# Patient Record
Sex: Male | Born: 1985 | Race: White | Hispanic: No | Marital: Married | Smoking: Former smoker
Health system: Southern US, Community
[De-identification: ages and names within clinical notes are randomized; demographics above are authoritative.]

## PROBLEM LIST (undated history)

## (undated) DIAGNOSIS — G709 Myoneural disorder, unspecified: Secondary | ICD-10-CM

## (undated) DIAGNOSIS — R519 Headache, unspecified: Secondary | ICD-10-CM

## (undated) DIAGNOSIS — I10 Essential (primary) hypertension: Secondary | ICD-10-CM

## (undated) DIAGNOSIS — M1990 Unspecified osteoarthritis, unspecified site: Secondary | ICD-10-CM

## (undated) DIAGNOSIS — F909 Attention-deficit hyperactivity disorder, unspecified type: Secondary | ICD-10-CM

## (undated) DIAGNOSIS — F419 Anxiety disorder, unspecified: Secondary | ICD-10-CM

## (undated) DIAGNOSIS — F32A Depression, unspecified: Secondary | ICD-10-CM

## (undated) DIAGNOSIS — J9383 Other pneumothorax: Secondary | ICD-10-CM

## (undated) DIAGNOSIS — R634 Abnormal weight loss: Secondary | ICD-10-CM

## (undated) DIAGNOSIS — R0600 Dyspnea, unspecified: Secondary | ICD-10-CM

## (undated) DIAGNOSIS — D759 Disease of blood and blood-forming organs, unspecified: Secondary | ICD-10-CM

## (undated) HISTORY — DX: Abnormal weight loss: R63.4

## (undated) HISTORY — DX: Other pneumothorax: J93.83

## (undated) HISTORY — PX: OTHER SURGICAL HISTORY: SHX169

---

## 2006-12-10 ENCOUNTER — Ambulatory Visit: Payer: Self-pay | Admitting: Family Medicine

## 2006-12-10 DIAGNOSIS — N63 Unspecified lump in unspecified breast: Secondary | ICD-10-CM | POA: Insufficient documentation

## 2006-12-11 ENCOUNTER — Encounter: Payer: Self-pay | Admitting: Family Medicine

## 2006-12-16 ENCOUNTER — Telehealth: Payer: Self-pay | Admitting: Family Medicine

## 2007-02-12 ENCOUNTER — Ambulatory Visit: Payer: Self-pay | Admitting: Family Medicine

## 2007-02-12 DIAGNOSIS — M546 Pain in thoracic spine: Secondary | ICD-10-CM | POA: Insufficient documentation

## 2007-02-19 ENCOUNTER — Encounter: Admission: RE | Admit: 2007-02-19 | Discharge: 2007-03-06 | Payer: Self-pay | Admitting: Family Medicine

## 2007-02-19 ENCOUNTER — Encounter: Payer: Self-pay | Admitting: Family Medicine

## 2007-03-06 ENCOUNTER — Encounter: Payer: Self-pay | Admitting: Family Medicine

## 2007-06-11 ENCOUNTER — Encounter: Admission: RE | Admit: 2007-06-11 | Discharge: 2007-06-11 | Payer: Self-pay | Admitting: Family Medicine

## 2007-06-11 ENCOUNTER — Ambulatory Visit: Payer: Self-pay | Admitting: Family Medicine

## 2007-06-11 ENCOUNTER — Encounter: Payer: Self-pay | Admitting: Family Medicine

## 2007-06-11 DIAGNOSIS — R079 Chest pain, unspecified: Secondary | ICD-10-CM | POA: Insufficient documentation

## 2007-06-15 LAB — CONVERTED CEMR LAB
ALT: 12 units/L (ref 0–53)
Albumin: 5.4 g/dL — ABNORMAL HIGH (ref 3.5–5.2)
Alkaline Phosphatase: 68 units/L (ref 39–117)
Amylase: 38 units/L (ref 0–105)
CO2: 26 meq/L (ref 19–32)
Calcium: 9.9 mg/dL (ref 8.4–10.5)
Hemoglobin: 13.4 g/dL (ref 13.0–17.0)
MCHC: 32.5 g/dL (ref 30.0–36.0)
MCV: 61.8 fL — ABNORMAL LOW (ref 78.0–100.0)
Monocytes Absolute: 0.7 10*3/uL (ref 0.2–0.7)
Monocytes Relative: 7 % (ref 3–11)
Neutro Abs: 7.5 10*3/uL (ref 1.7–7.7)
Neutrophils Relative %: 70 % (ref 43–77)
Platelets: 260 10*3/uL (ref 150–400)
Total Protein: 8.3 g/dL (ref 6.0–8.3)

## 2007-06-16 ENCOUNTER — Telehealth (INDEPENDENT_AMBULATORY_CARE_PROVIDER_SITE_OTHER): Payer: Self-pay | Admitting: *Deleted

## 2007-06-16 ENCOUNTER — Encounter: Payer: Self-pay | Admitting: Family Medicine

## 2007-06-16 LAB — CONVERTED CEMR LAB
Saturation Ratios: 41 % (ref 20–55)
TIBC: 309 ug/dL (ref 215–435)

## 2007-06-17 ENCOUNTER — Ambulatory Visit: Payer: Self-pay | Admitting: Family Medicine

## 2007-06-17 DIAGNOSIS — R071 Chest pain on breathing: Secondary | ICD-10-CM | POA: Insufficient documentation

## 2007-07-01 ENCOUNTER — Telehealth: Payer: Self-pay | Admitting: Family Medicine

## 2007-07-01 ENCOUNTER — Ambulatory Visit: Payer: Self-pay | Admitting: Family Medicine

## 2007-09-30 ENCOUNTER — Ambulatory Visit: Payer: Self-pay | Admitting: Family Medicine

## 2007-09-30 DIAGNOSIS — D568 Other thalassemias: Secondary | ICD-10-CM | POA: Insufficient documentation

## 2007-09-30 DIAGNOSIS — R634 Abnormal weight loss: Secondary | ICD-10-CM | POA: Insufficient documentation

## 2007-09-30 HISTORY — DX: Abnormal weight loss: R63.4

## 2007-10-01 LAB — CONVERTED CEMR LAB
ALT: 10 units/L (ref 0–53)
AST: 15 units/L (ref 0–37)
Albumin: 5.6 g/dL — ABNORMAL HIGH (ref 3.5–5.2)
BUN: 13 mg/dL (ref 6–23)
CO2: 24 meq/L (ref 19–32)
Chloride: 103 meq/L (ref 96–112)
Glucose, Bld: 93 mg/dL (ref 70–99)
Platelets: 216 10*3/uL (ref 150–400)
RDW: 16.7 % — ABNORMAL HIGH (ref 11.5–15.5)
TSH: 0.719 microintl units/mL (ref 0.350–5.50)
Total Bilirubin: 1.1 mg/dL (ref 0.3–1.2)
WBC: 7 10*3/uL (ref 4.0–10.5)

## 2007-10-29 ENCOUNTER — Telehealth: Payer: Self-pay | Admitting: Family Medicine

## 2007-12-02 ENCOUNTER — Ambulatory Visit: Payer: Self-pay | Admitting: Family Medicine

## 2007-12-02 DIAGNOSIS — F5102 Adjustment insomnia: Secondary | ICD-10-CM | POA: Insufficient documentation

## 2008-06-11 ENCOUNTER — Ambulatory Visit: Payer: Self-pay | Admitting: Family Medicine

## 2008-06-11 DIAGNOSIS — H9209 Otalgia, unspecified ear: Secondary | ICD-10-CM | POA: Insufficient documentation

## 2010-02-26 ENCOUNTER — Encounter: Payer: Self-pay | Admitting: Family Medicine

## 2010-04-07 ENCOUNTER — Encounter: Admission: RE | Admit: 2010-04-07 | Discharge: 2010-04-07 | Payer: Self-pay | Admitting: Family Medicine

## 2010-04-07 ENCOUNTER — Ambulatory Visit: Payer: Self-pay | Admitting: Family Medicine

## 2010-04-07 DIAGNOSIS — J930 Spontaneous tension pneumothorax: Secondary | ICD-10-CM | POA: Insufficient documentation

## 2010-04-07 DIAGNOSIS — Z8709 Personal history of other diseases of the respiratory system: Secondary | ICD-10-CM | POA: Insufficient documentation

## 2010-04-14 ENCOUNTER — Encounter: Payer: Self-pay | Admitting: Family Medicine

## 2010-09-05 NOTE — Assessment & Plan Note (Signed)
Summary: pneumothorax   Vital Signs:  Patient profile:   24 year old male Height:      68.75 inches Weight:      141 pounds BMI:     21.05 O2 Sat:      97 % on Room air Pulse rate:   87 / minute BP sitting:   144 / 90  (left arm) Cuff size:   regular  Vitals Entered By: Raymond [name] CMA (April 07, 2010 9:24 AM)  O2 Flow:  Room air CC: Chest wall pain x 1 day  Pain Assessment Patient in pain? yes     Location: chest Intensity: 6   Primary Care Raymond [name]:  Raymond [name] D.O.  CC:  Chest wall pain x 1 day .  History of Present Illness: 25 yo WF presents for L sided acute chest pain x 1 day.  Has been lifting wts at the gym.  Has some pain with deep inspiration and cough.  Ibuprofen not helping.  Feels sore.  Denies feeling much dyspnea.  Denies f/c or sputum production.  He no longer smokes.  The end of July, he was diagnosed with a spontaneous Pneumothorax at [facility] and transported to [facility] for a wk.  Had a L sided chest tube placed.    Current Medications (verified): 1)  None  Allergies (verified): No Known Drug Allergies  Past History:  Past Medical History: Reviewed history from 06/11/2007 and no changes required. tobacco abuse Hx of thalassemia minor  Past Surgical History: chest tube placed 02-2010 for spontaneous pneumothorax  Social History: Reviewed history from 12/02/2007 and no changes required. IT sales professional for [employer]. high school diploma lives with his fianc Raymond [name] and her two children smokes one pack a day for  4 years;-- quit history of marijuana use. denies alcohol use lifts weights and exercises 3 times a week  Review of Systems      See HPI  Physical Exam  General:  alert, well-developed, well-nourished, and well-hydrated.   Head:  normocephalic and atraumatic.   Neck:  no masses.   Lungs:  Normal respiratory effort, chest expands symmetrically. Lungs are clear to auscultation, no crackles or  wheezes.  splinting. Heart:  Normal rate and regular rhythm. S1 and S2 normal without gallop, murmur, click, rub or other extra sounds. Abdomen:  soft and non-tender.   Msk:  full T spine and UE ROM -- unable to elicit chest wall pain with ROM or palpation Skin:  color normal.  chest tube scar L ant chest wall well healed   Impression & Recommendations:  Problem # 1:  PNEUMOTHORAX (ICD-512.8) 90% on CXR now. Recurrent. Radiology downstairs called 911 and pt was informed of dx. Sent to hospital for immediate chest tube.  Complete Medication List: 1)  Ibuprofen 800 Mg Tabs (Ibuprofen) .Marland Kitchen.. 1 tab by mouth three times a day with food x 10 days  Other Orders: T-DG Chest 2 View [phone])  Patient Instructions: 1)  CXR downstairs today. 2)  Will call you w/ results this afternoon. 3)  Use RX Ibuprofen 800 mg 3 x a day with food for the next wk for pain and inflammation. 4)  Use a heating pad and avoid heavy lifting x 10 days. Prescriptions: IBUPROFEN 800 MG TABS (IBUPROFEN) 1 tab by mouth three times a day with food x 10 days  #30 x 0   Entered and Authorized by:   Raymond Bars DO   Signed by:   Raymond Bars  DO on 04/07/2010   Method used:   Electronically to        [facility] [phone]* (retail)       [address] [place], [place]  [address]       Ph: [phone]       Fax: [phone]   RxID:   [id number]

## 2010-09-05 NOTE — Letter (Signed)
Summary: Meds & Instructions/[facility]  Meds & Instructions/[facility]   Imported By: Lanelle Bal 04/27/2010 10:21:34  _____________________________________________________________________  External Attachment:    Type:   Image     Comment:   External Document

## 2010-09-05 NOTE — Letter (Signed)
Summary: Meds & Instructions/[facility]  Meds & Instructions/[facility]   Imported By: Lanelle Bal 03/06/2010 11:04:18  _____________________________________________________________________  External Attachment:    Type:   Image     Comment:   External Document

## 2010-09-05 NOTE — Letter (Signed)
Summary: Out of Work  [facility]  [address]   [place], [place] [address]   Phone: [phone]  Fax: [phone]    April 07, 2010   Employee:  Raymond [name]    To Whom It May Concern:   For Medical reasons, please excuse the above named employee from work for the following dates:  Start:   Sept 2-3  End:   Sept 4  If you need additional information, please feel free to contact our office.         Sincerely,    Seymour Bars DO

## 2012-07-21 ENCOUNTER — Encounter: Payer: Self-pay | Admitting: Family Medicine

## 2012-07-21 ENCOUNTER — Ambulatory Visit (INDEPENDENT_AMBULATORY_CARE_PROVIDER_SITE_OTHER): Payer: Self-pay | Admitting: Family Medicine

## 2012-07-21 VITALS — BP 136/86 | HR 95 | Ht 70.0 in | Wt 136.0 lb

## 2012-07-21 DIAGNOSIS — F1220 Cannabis dependence, uncomplicated: Secondary | ICD-10-CM

## 2012-07-21 DIAGNOSIS — Z23 Encounter for immunization: Secondary | ICD-10-CM

## 2012-07-21 DIAGNOSIS — F329 Major depressive disorder, single episode, unspecified: Secondary | ICD-10-CM

## 2012-07-21 DIAGNOSIS — F3289 Other specified depressive episodes: Secondary | ICD-10-CM

## 2012-07-21 DIAGNOSIS — F32A Depression, unspecified: Secondary | ICD-10-CM

## 2012-07-21 MED ORDER — PAROXETINE HCL 20 MG PO TABS: ORAL_TABLET | ORAL | Status: DC

## 2012-07-21 NOTE — Progress Notes (Signed)
  Subjective:    Patient ID: Raymond [name], male    DOB: 03-03-1986, 26 y.o.   MRN: [id number]  HPI Says he is depressed.  He is now dependent on pot bc helps his mood. He also smokes cigarettes. It is destroying his relationship and it is taking his money.  Says he thinks he is depressed.  Says mostly withdrawing socially.  Brother died when he is 74.  Not sleeping well for 2 weeks. Wakes up frequently. Doesn't smoke in the middle of the night.  Decreased appetite.  Says but when he smokes his appetitie increases.     Review of Systems     Objective:   Physical Exam  Constitutional: He is oriented to person, place, and time. He appears well-developed and well-nourished.  HENT:  Head: Normocephalic and atraumatic.  Cardiovascular: Normal rate, regular rhythm and normal heart sounds.   Pulmonary/Chest: Effort normal and breath sounds normal.  Neurological: He is alert and oriented to person, place, and time.  Skin: Skin is warm and dry.  Psychiatric: He has a normal mood and affect. His behavior is normal.          Assessment & Plan:  Depression - PHQ 9 score 22.  We discussed different options. I think at this point in time it would be best to consider antidepressant in addition to an injection type program. He says he is very open to this. He was placed on Seroquel when he was in his teens and says he did not do well with this. Thus we will discuss starting him on Paxil. I think this would be a great medication for him because it will hopefully help stimulate his appetite which is one of his issues. He is very thin and underweight based on his height. F/u 1 month.   Addiction - Will call him with additional info regarding some type of addiction course/counseling.   Tdap give today. Flu vaccine declined.    Time spent 25 min, >50% spent in counseling for depression and addiction.

## 2012-07-28 ENCOUNTER — Telehealth: Payer: Self-pay | Admitting: Family Medicine

## 2012-07-28 NOTE — Telephone Encounter (Signed)
Please call pt adn given him the number for [facility]al [facility] in [place].  [phone]. They can help him with his addiction.

## 2012-07-28 NOTE — Telephone Encounter (Signed)
Pt informed

## 2013-01-15 ENCOUNTER — Ambulatory Visit (INDEPENDENT_AMBULATORY_CARE_PROVIDER_SITE_OTHER): Payer: BC Managed Care – PPO | Admitting: Family Medicine

## 2013-01-15 ENCOUNTER — Encounter: Payer: Self-pay | Admitting: Family Medicine

## 2013-01-15 VITALS — BP 119/71 | HR 107 | Wt 131.0 lb

## 2013-01-15 DIAGNOSIS — F411 Generalized anxiety disorder: Secondary | ICD-10-CM

## 2013-01-15 LAB — T4, FREE: Free T4: 1.15 ng/dL (ref 0.80–1.80)

## 2013-01-15 LAB — TSH: TSH: 0.665 u[IU]/mL (ref 0.350–4.500)

## 2013-01-15 MED ORDER — CITALOPRAM HYDROBROMIDE 20 MG PO TABS: 20.0000 mg | ORAL_TABLET | Freq: Every day | ORAL | Status: DC

## 2013-01-15 NOTE — Progress Notes (Signed)
CC: Raymond [name] is a 27 y.o. male is here for Anxiety   Subjective: HPI:  Very pleasant 27 year old here with complaints including the following  He describes trouble sleeping for the past week. Is described as an inability to fall asleep no trouble staying asleep. He will try falling asleep at 9 PM his usual bedtime and will not slumber until midnight. He is unable to describe what is contributing to this, denies racing thoughts, nor paranoia. Denies change in sleeping environment. Admits that fianc's father past recently but doesn't seem to stress him too much and he denies any financial or relationship problems and his life. He has never had trouble getting to sleep to this degree. He denies stimulant use alcohol use and does not consume caffeine except for early morning.  He describes a restlessness, nervousness, constant worrying at work for no particular reason. He describes trouble relaxing, inability to sit still but denies trouble concentrating. These symptoms have been present for over a year now. Severity has not been changing but they are more impacting his quality of life. He describes them as moderate to severe in severity occurring every day. Symptoms are worse with stress at [employer], nothing else makes better or worse. He used to smoke pot but quit 6 months ago does not use any recreational street drugs otherwise recently or remotely. He was once prescribed Paxil but doesn't recall if he really took it or not. He denies subjective depression, loss in hobbies, nor appetite change.  Patient complains of inability to gain weight despite eating fast food f 4-5 times a day most days of the week without ever skipping a meal otherwise. He denies a history of loose stools and reports a single daily nonpainful well-formed bowel movement that is predictable everyday. He denies abdominal pain, nausea, blood in stool nor fatigue.   Review Of Systems Outlined In HPI  Past Medical History   Diagnosis Date  . WEIGHT LOSS, ABNORMAL 09/30/2007    Qualifier: Diagnosis of  By: Thomos Lemons       Family History  Problem Relation Age of Onset  . Hypertension Father      History  Substance Use Topics  . Smoking status: Current Every Day Smoker -- 0.50 packs/day  . Smokeless tobacco: Not on file  . Alcohol Use: Not on file     Objective: Filed Vitals:   01/15/13 1102  BP: 119/71  Pulse: 107    General: Alert and Oriented, No Acute Distress HEENT: Pupils equal, round, reactive to light. Conjunctivae clear.  Moist mucous membranes pharynx unremarkable Lungs: Clear to auscultation bilaterally, no wheezing/ronchi/rales.  Comfortable work of breathing. Good air movement. Cardiac: Regular rate and rhythm. Normal S1/S2.  No murmurs, rubs, nor gallops.   Abdomen:  Flat soft nontender no palpable masses Extremities: No peripheral edema.  Strong peripheral pulses.  Mental Status: No depression he is somewhat anxious, normal thought process, well-dressed, pleasant Skin: Warm and dry.  Assessment & Plan: Raymond [name] was seen today for anxiety.  Diagnoses and associated orders for this visit:  Generalized anxiety disorder - citalopram (CELEXA) 20 MG tablet; Take 1 tablet (20 mg total) by mouth daily. - TSH - T4, free - T3, free     discussed with patient his constellation of symptoms with a diagnosis of general anxiety disorder versus hyperthyroidism, will rule out thyroid abnormality however if these are normal I like him to start Celexa starting tomorrow. low suspicion for celiac disease or pancreatic insufficiency given lack  of GI disturbance  25 minutes spent face-to-face during visit today of which at least 50% was counseling or coordinating care regarding generalized anxiety disorder, abnormal weight loss.   Return in about 4 weeks (around 02/12/2013).

## 2013-08-19 ENCOUNTER — Ambulatory Visit (INDEPENDENT_AMBULATORY_CARE_PROVIDER_SITE_OTHER): Payer: BC Managed Care – PPO | Admitting: Family Medicine

## 2013-08-19 ENCOUNTER — Encounter: Payer: Self-pay | Admitting: *Deleted

## 2013-08-19 ENCOUNTER — Encounter: Payer: Self-pay | Admitting: Family Medicine

## 2013-08-19 VITALS — BP 143/79 | HR 91 | Temp 98.1°F | Ht 71.0 in | Wt 132.0 lb

## 2013-08-19 DIAGNOSIS — G4700 Insomnia, unspecified: Secondary | ICD-10-CM

## 2013-08-19 DIAGNOSIS — IMO0002 Reserved for concepts with insufficient information to code with codable children: Secondary | ICD-10-CM

## 2013-08-19 DIAGNOSIS — F411 Generalized anxiety disorder: Secondary | ICD-10-CM

## 2013-08-19 DIAGNOSIS — F1920 Other psychoactive substance dependence, uncomplicated: Secondary | ICD-10-CM

## 2013-08-19 MED ORDER — TRAZODONE HCL 50 MG PO TABS: 50.0000 mg | ORAL_TABLET | Freq: Every day | ORAL | Status: DC

## 2013-08-19 MED ORDER — PAROXETINE HCL 20 MG PO TABS: ORAL_TABLET | ORAL | Status: DC

## 2013-08-19 NOTE — Progress Notes (Signed)
   Subjective:    Patient ID: Marko PlumeJason L Zechman, male    DOB: 08-Jul-1986, 28 y.o.   MRN: [id number]  HPI Says started smoking K2 spice ( synthetic marijuana). Quit last week., On Wednesday. The first 3 days he had vomiting and diarrhea. He did miss one day of work because of the vomiting and diarrhea. Says waking up with panic symptoms. Missed last Wed at work bc of it.  Says he thinks he needs to see a pyschiatrist.  Saw psych about 16 years ago.  He c/o of feeling nervous, on edge, irritable.  Can't sleep well. Father is with him today. Has been really tearful, can't sleep and can't eat well right now.  Was been waking up "scared" and anxious and with tremors.  Feels like has only sleep about 10 hours in the last 3 days.   He works third shift at [employer].  This has been causing problems with her fiance bc of all this.    Review of Systems     Objective:   Physical Exam  Constitutional: He is oriented to person, place, and time. He appears well-developed and well-nourished.  HENT:  Head: Normocephalic and atraumatic.  Cardiovascular: Normal rate, regular rhythm and normal heart sounds.   Pulmonary/Chest: Effort normal and breath sounds normal.  Neurological: He is alert and oriented to person, place, and time.  Skin: Skin is warm and dry.  Psychiatric: His behavior is normal.  He is very anxious today. His hands are shaking. He is not diaphoretic. He does look a little pale.          Assessment & Plan:  GAD - Her GAD - 7 score of 21 today. Uncontrolled. We discussed different treatment options. Now that he has stopped the marijuana a week ago, last Wednesday I think he is ready to start a maintenance medication for anxiety. We will start Paxil. We discussed this him as to year ago but he never actually took it. I will start him on 20 mg daily and he will continue this until I see him back in one week. We can adjust the dose as needed. Reiterated the importance of being very consistent with  the medication. I explained that sometimes people who has substance abuse issues often have underlying mood issues which continues to trigger their addiction.  Substance abuse-he has been off of the synthetic marijuana for a week. It sounds like he has passed through the initial withdrawal symptoms that he experienced the first 3 days. Right now he is feeling positive about not using again.  Insomnia-he slept very little since stopping the synthetic marijuana. We'll start trazodone at bedtime. Start with one tab and if not getting a response "to 2 tabs. Take nightly until I see him back in one week. I did go ahead and get him a work note for one week and we can readdress and reassess at that time. He will need to get FMLA paperwork from his work so that this can be completed.  Mother was here and present for the entire conversation and seems very supportive.

## 2013-08-24 ENCOUNTER — Telehealth: Payer: Self-pay | Admitting: Family Medicine

## 2013-08-24 NOTE — Telephone Encounter (Signed)
Pt's father Raymond Fus(Tommy) walked-in request to speak with you about son's medical leave paper work. He states he is having a problem with Raymond [name] which is his son's employer and the insurance company that [name] may use. Request a call back asap. Thanks

## 2013-08-24 NOTE — Telephone Encounter (Signed)
Pt's father called and informed. Forms completed, scanned and faxed.Loralee PacasBarkley, Bubber Rothert [place]

## 2013-08-26 ENCOUNTER — Encounter: Payer: Self-pay | Admitting: Family Medicine

## 2013-08-26 ENCOUNTER — Ambulatory Visit (INDEPENDENT_AMBULATORY_CARE_PROVIDER_SITE_OTHER): Payer: BC Managed Care – PPO | Admitting: Family Medicine

## 2013-08-26 VITALS — BP 145/95 | HR 97 | Temp 98.3°F | Wt 128.0 lb

## 2013-08-26 DIAGNOSIS — F411 Generalized anxiety disorder: Secondary | ICD-10-CM

## 2013-08-26 DIAGNOSIS — R202 Paresthesia of skin: Secondary | ICD-10-CM

## 2013-08-26 DIAGNOSIS — G4700 Insomnia, unspecified: Secondary | ICD-10-CM

## 2013-08-26 DIAGNOSIS — R209 Unspecified disturbances of skin sensation: Secondary | ICD-10-CM

## 2013-08-26 MED ORDER — PAROXETINE HCL 20 MG PO TABS: ORAL_TABLET | ORAL | Status: DC

## 2013-08-26 NOTE — Progress Notes (Signed)
   Subjective:    Patient ID: Raymond [name], male    DOB: [birth year], 28 y.o.   MRN: [id number]  HPI Here to followup on generalized anxiety disorder. He is feeling much better.  Sleep is better. Getting about 6-7 hours.  Says much more calm.  Says the trazodone helping and has been taking.    Gets numbness or tingling in fingertips and lips.  Says wonders if it could be a side effect from using the synthetic marijuana.  Says still happening some, but has been getting a little bit better. He wonders if the effects could potentially be long-term he read on line that could be an is concerned about this. He also had poor nutritional intake for quite some time.   Review of Systems     Objective:   Physical Exam  Constitutional: He is oriented to person, place, and time. He appears well-developed and well-nourished.  HENT:  Head: Normocephalic and atraumatic.  Cardiovascular: Normal rate, regular rhythm and normal heart sounds.   Pulmonary/Chest: Effort normal and breath sounds normal.  Neurological: He is alert and oriented to person, place, and time.  Skin: Skin is warm and dry.  Psychiatric: He has a normal mood and affect. His behavior is normal.          Assessment & Plan:  GAD - GAD-7 score of 0 today.  Well controlled. Continue Paxil. Discussed expectation to continue for least 6-12 months at that point we will discuss potentially weaning the medication. I do think underlying anxiety has contributed to his drug use over the years. I do feel that he is safe mentally, physically, and medically to return to work tomorrow. Ordered provided.  Insomnia - Says the trazodone is helping but hasn't tried not taking it.  Encouraged him to continue taking until the weekend. He does not have to work on the weekend so he can try without it to see how he does.  Paresthesias, fingertips in both hands-this is aren't improving a little bit this week. Hopefully resolve over the next month. Hepatus but  no permanent nerve damage. If he still has symptoms when I see him back consider checking B12 folate and iron levels. Note. Patient does have thalassemia minor.

## 2013-09-08 ENCOUNTER — Ambulatory Visit (INDEPENDENT_AMBULATORY_CARE_PROVIDER_SITE_OTHER): Payer: BC Managed Care – PPO | Admitting: Psychiatry

## 2013-09-08 ENCOUNTER — Encounter (HOSPITAL_COMMUNITY): Payer: Self-pay | Admitting: Psychiatry

## 2013-09-08 VITALS — BP 131/91 | HR 80 | Ht 68.0 in | Wt 130.0 lb

## 2013-09-08 DIAGNOSIS — F411 Generalized anxiety disorder: Secondary | ICD-10-CM

## 2013-09-08 DIAGNOSIS — F1994 Other psychoactive substance use, unspecified with psychoactive substance-induced mood disorder: Secondary | ICD-10-CM

## 2013-09-08 MED ORDER — PAROXETINE HCL 20 MG PO TABS: ORAL_TABLET | ORAL | Status: DC

## 2013-09-08 MED ORDER — TRAZODONE HCL 50 MG PO TABS: 50.0000 mg | ORAL_TABLET | Freq: Every day | ORAL | Status: DC

## 2013-09-08 NOTE — Progress Notes (Addendum)
Psychiatric Assessment Adult  Patient Identification:  Raymond [name] Date of Evaluation:  09/08/2013 Chief Complaint:    Chief Complaint  Patient presents with  . Anxiety  . Depression   History of Chief Complaint:  HPI Comments: HPI Comments: Raymond [name] is  a 28 y/o male with a past psychiatric history significant for symptoms of anxiety and depression. The patient is referred for psychiatric services for psychiatric evaluation and medication management.    . Location- The patient reports he is doing well with the initiation of paroxetine.  . Quality: The patient reports that his main stressors are:  "work" "finances"  In the area of affective symptoms, patient appears anxious. Patient denies current suicidal ideation, intent, or plan. Patient denies current homicidal ideation, intent, or plan. Patient denies auditory hallucinations. Patient denies visual hallucinations. Patient denies symptoms of paranoia. Patient states sleep is poor, with approximately 7-8 hours of sleep per night. Appetite is good. Energy level is good. Patient denies symptoms of anhedonia. Patient denies hopelessness, helplessness, or guilt.   . Severity: Depression: 8/10 (0=Very depressed; 5=Neutral; 10=Very Happy)  Anxiety- 2/10 (0=no anxiety; 5= moderate/tolerable anxiety; 10= panic attacks)   . Duration: Since his brother died in 12/19/2002.   . Timing: Worse in the middle of the day.  .  Context: Work .  Modifying factors: Joking around, "being silly"  .  Associated signs and symptoms: As noted in psychiatric ROS   Review of Systems  Constitutional: Positive for appetite change (Improving). Negative for fever, chills, activity change and fatigue.  Respiratory: Negative for apnea, cough, chest tightness, shortness of breath, wheezing and stridor.   Cardiovascular: Negative for chest pain, palpitations and leg swelling.  Gastrointestinal: Negative for nausea, abdominal pain, diarrhea, blood in stool,  abdominal distention and anal bleeding.  Endocrine: Positive for heat intolerance and polydipsia. Negative for cold intolerance, polyphagia and polyuria.  Genitourinary: Negative for dysuria, enuresis and difficulty urinating.  Neurological: Positive for numbness (In fingers). Negative for dizziness, seizures, speech difficulty, weakness, light-headedness and headaches.   Filed Vitals:   09/08/13 0920  BP: 131/91  Pulse: 80  Height: 5\' 8"  (1.727 m)  Weight: 130 lb (58.968 kg)   Physical Exam  Vitals reviewed. Constitutional: He appears well-developed and well-nourished. No distress.  Skin: He is not diaphoretic.  Musculoskeletal: Gait & Station: normal Patient leans: N/A   Depressive Symptoms: None  (Hypo) Manic Symptoms:   Elevated Mood:  No Irritable Mood:  Yes Grandiosity:  No Distractibility:  Yes Labiality of Mood:  No Delusions:  No Hallucinations:  No Impulsivity:  No Sexually Inappropriate Behavior:  No Financial Extravagance:  No Flight of Ideas:  No  Anxiety Symptoms: Excessive Worry:  Yes Panic Symptoms:  Yes Agoraphobia:  No Obsessive Compulsive: Yes  Symptoms: Order. Specific Phobias:  No Social Anxiety:  No  Psychotic Symptoms:  Hallucinations: Negative None Delusions:  No Paranoia:  No   Ideas of Reference:  No  PTSD Symptoms: Ever had a traumatic exposure:  Yes-brother's death in a car wreck Had a traumatic exposure in the last month:  Negative Re-experiencing: Yes Flashbacks-military coming into the house telling him about his brother's death Hypervigilance:  Negative Hyperarousal: Negative Irritability/Anger Avoidance: Negative None  Traumatic Brain Injury: Negative  Past Psychiatric History: Diagnosis: Substance Induced Mood Disorder  Hospitalizations: Patient denies.  Outpatient Care: Patient denies  Substance Abuse Care: Patient denies.  Self-Mutilation: Patient denies.  Suicidal Attempts: Patient denies  Violent Behaviors:  Patient denies.   Past Medical  History:   Past Medical History  Diagnosis Date  . WEIGHT LOSS, ABNORMAL 09/30/2007    Qualifier: Diagnosis of  By: Thomos LemonsBowen DO, Karen     History of Loss of Consciousness:  No Seizure History:  Negative Cardiac History:  Negative  Allergies:  No Known Allergies  Current Medications:  Current Outpatient Prescriptions  Medication Sig Dispense Refill  . PARoxetine (PAXIL) 20 MG tablet One tab po QD  30 tablet  1  . traZODone (DESYREL) 50 MG tablet Take 1-2 tablets (50-100 mg total) by mouth at bedtime.  60 tablet  0   No current facility-administered medications for this visit.    Previous Psychotropic Medications:  Medication Dose  Celexa-too strong-sedating  Unknown  Paxil  20 mg  Trazodone 50-100 mg   Substance Abuse History in the last 12 months:  History   Social History  . Marital Status: Single    Spouse Name: N/A    Number of Children: N/A  . Years of Education: N/A   Social History Main Topics  . Smoking status: Current Every Day Smoker -- 0.75 packs/day  . Smokeless tobacco: None  . Alcohol Use: No     Comment: Stopped Drinking in December. Was drinking 2-3 beers a day.  . 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   Marland Kitchen. Sexual Activity: Yes    Partners: Female     Comment: No birth control   Other Topics Concern  . None   Social History Narrative  . None    Medical Consequences of Substance Abuse: Yes with spice use-  Legal Consequences of Substance Abuse: Patient denies  Family Consequences of Substance Abuse: Patient denies  Blackouts:  No DT's:  No Withdrawal Symptoms:  Yes Nausea-from spice use  Social History: Current Place of Residence: [place]. Place of Birth:[place], [place] Family Members: Patient Marital Status:  Engaged Children:No biological children  Sons:1  Daughters:1 Relationships: Patient reports his fiance is her main source  of emotional support. 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 Legal History: Patient denies Hobbies/Interests:Gardening, playing videogames  Family History:   Family History  Problem Relation Age of Onset  . Hypertension Father     Psychiatric Specialty Exam: General Appearance: Well Groomed  Eye Contact::  Good  Speech:  Clear and Coherent and Normal Rate  Volume:  Normal  Mood:  "wonderful"  Affect:  Appropriate and Congruent  Thought Process:  Coherent, Goal Directed, Linear and Logical  Orientation:  Full (Time, Place, and Person)  Thought Content:  WDL  Suicidal Thoughts:  No  Homicidal Thoughts:  No  Memory:  Immediate;   Good Recent;   Fair Remote;   Good  Judgement:  Fair  Insight:  Good  Psychomotor Activity:  Normal  Concentration:  Good  Recall:  Good  Akathisia:  Negative  Handed:  Right  AIMS (if indicated):   Not indicated  Assets:  ArchitectCommunication Skills Financial Resources/Insurance Housing Intimacy Social Support Transportation   language intact  fund of knowledge is good      Psychologist, educationalLaboratory/X-Ray Psychological Evaluation(s)   None  None   Assessment:  Generalized Anxiety Disorder-improveing Substance Induced Mood Disorder-improving  AXIS I Generalized Anxiety Disorder Substance Induced Mood Disorder  AXIS II No diagnosis  AXIS III Past Medical History  Diagnosis Date  . WEIGHT LOSS,  ABNORMAL 09/30/2007    Qualifier: Diagnosis of  By: Cathey Endow DO, Melany Guernsey IV other psychosocial or environmental problems  AXIS V 51-60 moderate symptoms   Treatment Plan/Recommendations:   Plan of Care:  PLAN:  1. Affirm with the patient that the medications are taken as ordered. Patient  expressed understanding of how their medications were to be used.    Laboratory:   No labs  warranted at this time.   Psychotherapy: Therapy: brief supportive therapy provided.  Discussed psychosocial stressors in detail.  Will refer to individual therapy. Continue individual therapy.  Medications:  Continue the following psychiatric medications as written prior to this appointment with the following changes::  a) Paxil Continue 20 mg daily b) Trazodone- Continue 50 mg as needed for insomnia.  -Risks and benefits, side effects and alternatives discussed with patient, he was given an opportunity to ask questions about his medication, illness, and treatment. All current psychiatric medications have been reviewed and discussed with the patient and adjusted as clinically appropriate. The patient has been provided an accurate and updated list of the medications being now prescribed.   Routine PRN Medications:  Negative  Consultations: The patient was encouraged to keep all PCP and specialty clinic appointments.   Safety Concerns:   Patient told to call clinic if any problems occur. Patient advised to go to  ER  if he should develop SI/HI, side effects, or if symptoms worsen. Has crisis numbers to call if needed.    Other:   8. Patient was instructed to return to clinic in 1 months.  9. The patient was advised to call and cancel their mental health appointment within 24 hours of the appointment, if they are unable to keep the appointment, as well as the three no show and termination from clinic policy. 10. The patient expressed understanding of the plan and agrees with the above.  Time Spent: 60 minutes   Jacqulyn Cane, MD 2/3/20159:11 AM

## 2013-09-25 ENCOUNTER — Ambulatory Visit (INDEPENDENT_AMBULATORY_CARE_PROVIDER_SITE_OTHER): Payer: BC Managed Care – PPO | Admitting: Physician Assistant

## 2013-09-25 ENCOUNTER — Encounter: Payer: Self-pay | Admitting: Physician Assistant

## 2013-09-25 VITALS — BP 127/78 | HR 97 | Wt 132.0 lb

## 2013-09-25 DIAGNOSIS — Z13220 Encounter for screening for lipoid disorders: Secondary | ICD-10-CM

## 2013-09-25 DIAGNOSIS — G4700 Insomnia, unspecified: Secondary | ICD-10-CM | POA: Insufficient documentation

## 2013-09-25 DIAGNOSIS — Z79899 Other long term (current) drug therapy: Secondary | ICD-10-CM

## 2013-09-25 DIAGNOSIS — R259 Unspecified abnormal involuntary movements: Secondary | ICD-10-CM

## 2013-09-25 DIAGNOSIS — Z131 Encounter for screening for diabetes mellitus: Secondary | ICD-10-CM

## 2013-09-25 DIAGNOSIS — F17200 Nicotine dependence, unspecified, uncomplicated: Secondary | ICD-10-CM

## 2013-09-25 DIAGNOSIS — R202 Paresthesia of skin: Secondary | ICD-10-CM | POA: Insufficient documentation

## 2013-09-25 DIAGNOSIS — R251 Tremor, unspecified: Secondary | ICD-10-CM

## 2013-09-25 DIAGNOSIS — F411 Generalized anxiety disorder: Secondary | ICD-10-CM | POA: Insufficient documentation

## 2013-09-25 NOTE — Progress Notes (Signed)
   Subjective:    Patient ID: Raymond [name], male    DOB: 1986/06/18, 28 y.o.   MRN: [id number]  HPI Pt is a 28 yo male who presents to the clinic to follow up on anxiety and numbness and tingling of fingers. Pt reports anxiety is very controlled today on paxil. He saw Dr. Demetrius CharityP downstairs and taking paxil and trazodone with great benefit with anxiety and insomnia. Numbness and tingling has resolved since last visit. He does have some occasional shaking of bilateral hands. He has stopped smoking cigarretes and on e-cig notices shaking more when craving nicotine.    Review of Systems     Objective:   Physical Exam  Constitutional: He is oriented to person, place, and time. He appears well-developed and well-nourished.  HENT:  Head: Normocephalic and atraumatic.  Cardiovascular: Normal rate, regular rhythm and normal heart sounds.   Pulmonary/Chest: Effort normal and breath sounds normal.  Neurological: He is alert and oriented to person, place, and time.  Skin: Skin is dry.  Psychiatric: He has a normal mood and affect. His behavior is normal.          Assessment & Plan:  Anxiety/insomnia- well controlled with paxil and trazadone. Managed by psych downstairs.   paraesthesia of both hands- resolved. No need for further testing.   Tobacco abuse/hand shaking- encouraged pt with stopping cigarrettes. Encouraged pt to come off of all nicotine all together. I think some of his shaking of hands is coming from withdrawal symptoms from nicotine. Continue to decrease. Call if needs help. Call if shaking gets worse.   Needs screening labs. Discussed need to make CPE appt.

## 2013-10-07 ENCOUNTER — Encounter (HOSPITAL_COMMUNITY): Payer: Self-pay | Admitting: Psychiatry

## 2013-10-07 ENCOUNTER — Ambulatory Visit (INDEPENDENT_AMBULATORY_CARE_PROVIDER_SITE_OTHER): Payer: BC Managed Care – PPO | Admitting: Psychiatry

## 2013-10-07 VITALS — BP 133/62 | HR 88 | Wt 141.0 lb

## 2013-10-07 DIAGNOSIS — F411 Generalized anxiety disorder: Secondary | ICD-10-CM

## 2013-10-07 DIAGNOSIS — F1994 Other psychoactive substance use, unspecified with psychoactive substance-induced mood disorder: Secondary | ICD-10-CM

## 2013-10-07 MED ORDER — TRAZODONE HCL 50 MG PO TABS: 50.0000 mg | ORAL_TABLET | Freq: Every day | ORAL | Status: DC

## 2013-10-07 MED ORDER — PAROXETINE HCL 20 MG PO TABS: ORAL_TABLET | ORAL | Status: DC

## 2013-10-07 NOTE — Progress Notes (Signed)
[facility] Follow-up Outpatient Visit  Patient Identification:  Raymond PlumeJason L [name] Date of Evaluation:  10/07/2013 Chief Complaint:    Chief Complaint  Patient presents with  . Follow-up  . Depression   History of Chief Complaint:  HPI Comments: HPI Comments: Mr. Raymond [name] is  a 28 y/o male with a past psychiatric history significant for symptoms of anxiety and depression. The patient is referred for psychiatric services for  medication management.    . Location- The patient reports he is doing well.  . Quality: The patient reports that his main stressors are:  "work" "finances"  In the area of affective symptoms, patient appears anxious. Patient denies current suicidal ideation, intent, or plan. Patient denies current homicidal ideation, intent, or plan. Patient denies auditory hallucinations. Patient denies visual hallucinations. Patient denies symptoms of paranoia. Patient states sleep is good, with approximately 7-8 hours of sleep per night. Appetite is good. Energy level is good. Patient denies symptoms of anhedonia. Patient denies hopelessness, helplessness, or guilt.   . Severity: Depression: 9/10 (0=Very depressed; 5=Neutral; 10=Very Happy)  Anxiety- 0-2/10 (0=no anxiety; 5= moderate/tolerable anxiety; 10= panic attacks)   . Duration: Since his brother died in 2004.   . Timing: Worse in the middle of the day.  .  Context: Work .  Modifying factors: Joking around, "being silly"  .  Associated signs and symptoms: As noted in psychiatric ROS   Review of Systems  Constitutional: Positive for appetite change (Improving). Negative for fever, chills, activity change and fatigue.  Respiratory: Negative for apnea, cough, chest tightness, shortness of breath, wheezing and stridor.   Cardiovascular: Negative for chest pain, palpitations and leg swelling.  Gastrointestinal: Negative for nausea, abdominal pain, diarrhea, blood in stool, abdominal distention and anal  bleeding.  Endocrine: Positive for heat intolerance and polydipsia. Negative for cold intolerance, polyphagia and polyuria.  Genitourinary: Negative for dysuria, enuresis and difficulty urinating.  Neurological: Positive for numbness (In fingers). Negative for dizziness, seizures, speech difficulty, weakness, light-headedness and headaches.  Psychiatric/Behavioral: Negative for suicidal ideas, hallucinations, behavioral problems, confusion, sleep disturbance, self-injury, dysphoric mood, decreased concentration and agitation. The patient is not nervous/anxious and is not hyperactive.    Filed Vitals:   10/07/13 1145  BP: 133/62  Pulse: 88  Weight: 141 lb (63.957 kg)   Physical Exam  Vitals reviewed. Constitutional: He appears well-developed. No distress.  Adequately nourished  Skin: He is not diaphoretic.  Musculoskeletal: Gait & Station: normal Patient leans: N/A   Depressive Symptoms: None  (Hypo) Manic Symptoms:   Elevated Mood:  No Irritable Mood:  Yes-due to pain medication. Grandiosity:  No Distractibility:  No Labiality of Mood:  No Delusions:  No Hallucinations:  No Impulsivity:  No Sexually Inappropriate Behavior:  No Financial Extravagance:  No Flight of Ideas:  No  Anxiety Symptoms: Excessive Worry:  No Panic Symptoms:  No Agoraphobia:  No Obsessive Compulsive: No Specific Phobias:  No Social Anxiety:  No  Psychotic Symptoms:  Hallucinations: Negative None Delusions:  No Paranoia:  No   Ideas of Reference:  No  PTSD Symptoms: Ever had a traumatic exposure:  Yes-brother's death in a car wreck Had a traumatic exposure in the last month:  Negative Re-experiencing: No Flashbacks None- Hypervigilance:  Negative Hyperarousal: Negative Irritability/Anger Avoidance: Negative None  Traumatic Brain Injury: Negative  Past Psychiatric History: Diagnosis: Substance Induced Mood Disorder  Hospitalizations: Patient denies.  Outpatient Care: Patient denies   Substance Abuse Care: Patient denies.  Self-Mutilation: Patient denies.  Suicidal  Attempts: Patient denies  Violent Behaviors: Patient denies.   Past Medical History:   Past Medical History  Diagnosis Date  . WEIGHT LOSS, ABNORMAL 09/30/2007    Qualifier: Diagnosis of  By: Thomos Lemons    . Spontaneous pneumothorax     Two in the past 2011   History of Loss of Consciousness:  No Seizure History:  Negative Cardiac History:  Negative  Allergies:  No Known Allergies  Current Medications:  Current Outpatient Prescriptions  Medication Sig Dispense Refill  . dicyclomine (BENTYL) 10 MG capsule       . oxyCODONE (OXY IR/ROXICODONE) 5 MG immediate release tablet       . PARoxetine (PAXIL) 20 MG tablet One tab po QD  30 tablet  1  . traZODone (DESYREL) 50 MG tablet Take 1 tablet (50 mg total) by mouth at bedtime.  30 tablet  1   No current facility-administered medications for this visit.    Previous Psychotropic Medications:  Medication Dose  Celexa-too strong-sedating  Unknown  Paxil  20 mg  Trazodone 50-100 mg   Substance Abuse History in the last 12 months:  History   Social History  . Marital Status: Single    Spouse Name: N/A    Number of Children: N/A  . Years of Education: N/A   Social History Main Topics  . Smoking status: Current Every Day Smoker -- 0.75 packs/day    Types: E-cigarettes  . Smokeless tobacco: None  . Alcohol Use: No     Comment: Stopped Drinking in December. Was drinking 2-3 beers a day.  . 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   Marland Kitchen Sexual Activity: Yes    Partners: Female     Comment: No birth control   Other Topics Concern  . None   Social History Narrative  . None    Medical Consequences of Substance Abuse: Yes with spice use-  Legal Consequences of Substance Abuse: Patient denies  Family Consequences of Substance Abuse: Patient denies  Blackouts:  No DT's:   No Withdrawal Symptoms:  Yes Nausea-from spice use  Social History: Current Place of Residence: [place]. Place of Birth:[place], [place] Family Members: Patient Marital Status:  Engaged Children:No biological children  Sons:1  Daughters:1 Relationships: Patient reports his fiance is her main source of emotional support. Education:  Working on Barrister's clerk Educational Problems/Performance: Nurse, children's Religious Beliefs/Practices: No History of Abuse: none Occupational Experiences: Advertising account planner History:  None. Legal History: Patient denies Hobbies/Interests:Gardening, playing videogames  Family History:   Family History  Problem Relation Age of Onset  . Hypertension Father   . Anxiety disorder Mother   . Depression Mother   . Drug abuse Brother   . Coronary artery disease Maternal Grandfather   . ADD / ADHD Neg Hx   . Alcohol abuse Neg Hx   . Bipolar disorder Neg Hx   . Dementia Neg Hx   . OCD Neg Hx   . Paranoid behavior Neg Hx   . Schizophrenia Neg Hx   . Thyroid disease Neg Hx     Psychiatric Specialty Exam: General Appearance: Well Groomed  Eye Contact::  Good  Speech:  Clear and Coherent and Normal Rate  Volume:  Normal  Mood:  "just fine"  Affect:  Appropriate and Congruent  Thought Process:  Coherent, Goal Directed, Linear and Logical  Orientation:  Full (Time, Place, and Person)  Thought Content:  WDL  Suicidal Thoughts:  No  Homicidal Thoughts:  No  Memory:  Immediate;   Good Recent;   Good Remote;   Good  Judgement:  Fair  Insight:  Good  Psychomotor Activity:  Normal  Concentration:  Good  Recall:  Good  Akathisia:  Negative  Handed:  Right  AIMS (if indicated):   Not indicated  Assets:  Architect Housing Intimacy Social Support Transportation   language intact  fund of knowledge is good      Psychologist, educational)   None  None   Assessment:   Generalized Anxiety Disorder-improveing Substance Induced Mood Disorder-improving  AXIS I Generalized Anxiety Disorder-Improved Substance Induced Mood Disorder_improved  AXIS II No diagnosis  AXIS III Past Medical History  Diagnosis Date  . WEIGHT LOSS, ABNORMAL 09/30/2007    Qualifier: Diagnosis of  By: Thomos Lemons    . Spontaneous pneumothorax     Two in the past 2011     AXIS IV other psychosocial or environmental problems  AXIS V 51-60 moderate symptoms   Treatment Plan/Recommendations:   Plan of Care:  PLAN:  1. Affirm with the patient that the medications are taken as ordered. Patient  expressed understanding of how their medications were to be used.    Laboratory:   No labs warranted at this time.   Psychotherapy: Therapy: brief supportive therapy provided.  Discussed psychosocial stressors in detail.  More than 50% of the visit was spent on individual therapy/counseling.   Medications:  Continue the following psychiatric medications as written prior to this appointment with the following changes::  a) Paxil Continue 20 mg daily b) Trazodone- Continue 50 mg as needed for insomnia.  -Risks and benefits, side effects and alternatives discussed with patient, he was given an opportunity to ask questions about his medication, illness, and treatment. All current psychiatric medications have been reviewed and discussed with the patient and adjusted as clinically appropriate. The patient has been provided an accurate and updated list of the medications being now prescribed.   Routine PRN Medications:  Negative  Consultations: The patient was encouraged to keep all PCP and specialty clinic appointments.   Safety Concerns:   Patient told to call clinic if any problems occur. Patient advised to go to  ER  if he should develop SI/HI, side effects, or if symptoms worsen. Has crisis numbers to call if needed.    Other:   8. Patient was instructed to return to clinic in 1 months.   9. The patient was advised to call and cancel their mental health appointment within 24 hours of the appointment, if they are unable to keep the appointment, as well as the three no show and termination from clinic policy. 10. The patient expressed understanding of the plan and agrees with the above. 11. Patient informed that April 15th, 2015 would be my last day at this clinic.   Time Spent: 15 minutes  Jacqulyn Cane, MD 3/4/201511:48 AM

## 2013-10-12 HISTORY — PX: INGUINAL HERNIA REPAIR: SUR1180

## 2013-11-18 ENCOUNTER — Ambulatory Visit (HOSPITAL_COMMUNITY): Payer: Self-pay | Admitting: Psychiatry

## 2014-01-08 ENCOUNTER — Other Ambulatory Visit: Payer: Self-pay | Admitting: Family Medicine

## 2014-01-08 DIAGNOSIS — F1994 Other psychoactive substance use, unspecified with psychoactive substance-induced mood disorder: Secondary | ICD-10-CM

## 2014-01-08 DIAGNOSIS — F411 Generalized anxiety disorder: Secondary | ICD-10-CM

## 2014-01-08 MED ORDER — TRAZODONE HCL 50 MG PO TABS: 50.0000 mg | ORAL_TABLET | Freq: Every day | ORAL | Status: DC

## 2014-01-08 MED ORDER — PAROXETINE HCL 20 MG PO TABS: ORAL_TABLET | ORAL | Status: DC

## 2014-01-08 NOTE — Progress Notes (Signed)
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  Will fill meds for now. F/U when gets new job.

## 2014-08-12 ENCOUNTER — Ambulatory Visit (INDEPENDENT_AMBULATORY_CARE_PROVIDER_SITE_OTHER): Payer: BLUE CROSS/BLUE SHIELD

## 2014-08-12 ENCOUNTER — Encounter: Payer: Self-pay | Admitting: Sports Medicine

## 2014-08-12 ENCOUNTER — Ambulatory Visit (INDEPENDENT_AMBULATORY_CARE_PROVIDER_SITE_OTHER): Payer: BLUE CROSS/BLUE SHIELD | Admitting: Sports Medicine

## 2014-08-12 VITALS — BP 126/89 | HR 92 | Ht 68.0 in | Wt 149.0 lb

## 2014-08-12 DIAGNOSIS — M545 Low back pain, unspecified: Secondary | ICD-10-CM

## 2014-08-12 MED ORDER — PREDNISONE 50 MG PO TABS: ORAL_TABLET | ORAL | Status: DC

## 2014-08-12 MED ORDER — CYCLOBENZAPRINE HCL 10 MG PO TABS: ORAL_TABLET | ORAL | Status: DC

## 2014-08-12 NOTE — Assessment & Plan Note (Signed)
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 We will start conservatively with physical therapy, prednisone, Flexeril at bedtime. X-rays. He works at [employer], he will get help with heavier items and wear his back brace.

## 2014-08-12 NOTE — Progress Notes (Signed)
   Subjective:    I'm seeing this patient as a consultation for:  Dr. Nani Gasseratherine Metheney  CC: Low back pain  HPI: For the past month this pleasant 29 year old male works at [employer] has had increasing low back pain, radicular trying to lift heavy objects, he has not been asking for help, and does not wear back brace. Pain is moderate, persistent without radiation, worse with sitting, flexion, Valsalva, no constitutional symptoms, no bowel or bladder dysfunction.  Past medical history, Surgical history, Family history not pertinant except as noted below, Social history, Allergies, and medications have been entered into the medical record, reviewed, and no changes needed.   Review of Systems: No headache, visual changes, nausea, vomiting, diarrhea, constipation, dizziness, abdominal pain, skin rash, fevers, chills, night sweats, weight loss, swollen lymph nodes, body aches, joint swelling, muscle aches, chest pain, shortness of breath, mood changes, visual or auditory hallucinations.   Objective:   General: Well Developed, well nourished, and in no acute distress.  Neuro/Psych: Alert and oriented x3, extra-ocular muscles intact, able to move all 4 extremities, sensation grossly intact. Skin: Warm and dry, no rashes noted.  Respiratory: Not using accessory muscles, speaking in full sentences, trachea midline.  Cardiovascular: Pulses palpable, no extremity edema. Abdomen: Does not appear distended. Back Exam:  Inspection: Unremarkable  Motion: Flexion 45 deg, Extension 45 deg, Side Bending to 45 deg bilaterally,  Rotation to 45 deg bilaterally  SLR laying: Negative  XSLR laying: Negative  Palpable tenderness: None. FABER: negative. Sensory change: Gross sensation intact to all lumbar and sacral dermatomes.  Reflexes: 2+ at both patellar tendons, 2+ at achilles tendons, Babinski's downgoing.  Strength at foot  Plantar-flexion: 5/5 Dorsi-flexion: 5/5 Eversion: 5/5 Inversion: 5/5  Leg  strength  Quad: 5/5 Hamstring: 5/5 Hip flexor: 5/5 Hip abductors: 5/5  Gait unremarkable.  X-rays are unremarkable. There does appear to be a rudimentary disc at the S1-S2 level.  Impression and Recommendations:   This case required medical decision making of moderate complexity.

## 2014-08-19 ENCOUNTER — Telehealth: Payer: Self-pay | Admitting: Family Medicine

## 2014-08-19 ENCOUNTER — Ambulatory Visit: Payer: Self-pay | Admitting: Physical Therapy

## 2014-08-19 ENCOUNTER — Telehealth: Payer: Self-pay | Admitting: *Deleted

## 2014-08-19 NOTE — Telephone Encounter (Signed)
Santina EvansCatherine, I was given some documents regarding the patient's mother wanting an increased dose of a narcotic, I think it was incorrectly given to me since I've never prescribed any pain medication to him and you're his PCP.  I'll put the docs on your desk by [place].

## 2014-08-19 NOTE — Telephone Encounter (Signed)
Pt called. He was seen at [facility] and has been diagnosed with another Hernia which  is underneath the Hernia repair he had done less than a year ago.Marland Kitchen. He is in pain and wants something stronger that otc meds. He was told he would need to see his dr to get a narotic-has the paperwork from specialist supporting .  Thank you.

## 2014-08-19 NOTE — Telephone Encounter (Signed)
appt scheduled for tomorrow. I cannot Rx a narcotic for a medical condition I haven't evaluated.

## 2014-08-19 NOTE — Telephone Encounter (Signed)
Raymond [name] called requesting pain meds for his recurrent hernia and pending hernia surgery that he has coming up. He doctors with [facility] and as a rule they do not prescribe pre-pain meds to their patients as per Raymond Cosierheresa Music therapist(nurse) and [employer]MC[employer]eth [name], [place]. I called Raymond [name] and scheduled an appointment for him to come in to office tomorrow and he was verbally understanding.

## 2014-08-20 ENCOUNTER — Encounter: Payer: Self-pay | Admitting: Family Medicine

## 2014-08-20 ENCOUNTER — Ambulatory Visit (INDEPENDENT_AMBULATORY_CARE_PROVIDER_SITE_OTHER): Payer: BLUE CROSS/BLUE SHIELD | Admitting: Family Medicine

## 2014-08-20 VITALS — BP 128/83 | HR 107 | Temp 98.2°F | Ht 68.0 in | Wt 151.0 lb

## 2014-08-20 DIAGNOSIS — K4090 Unilateral inguinal hernia, without obstruction or gangrene, not specified as recurrent: Secondary | ICD-10-CM

## 2014-08-20 DIAGNOSIS — F411 Generalized anxiety disorder: Secondary | ICD-10-CM | POA: Diagnosis not present

## 2014-08-20 MED ORDER — HYDROCODONE-ACETAMINOPHEN 5-325 MG PO TABS: 1.0000 | ORAL_TABLET | Freq: Three times a day (TID) | ORAL | Status: DC | PRN

## 2014-08-20 MED ORDER — AMBULATORY NON FORMULARY MEDICATION: Status: DC

## 2014-08-20 NOTE — Patient Instructions (Signed)
Cut Paxil in half and take half a tab daily for 2 weeks and then one every other day for 8 days and then stop.

## 2014-08-20 NOTE — Progress Notes (Signed)
   Subjective:    Patient ID: Marko PlumeJason L Hollett, male    DOB: November 04, 1985, 29 y.o.   MRN: [id number]  HPI pt stated that his 1st surgery was in 10/2013 he was seen in Dr. Ashley Jacobseyes ofc yesterday and was told that he has a 2nd hernia underneath the previous was told that the mesh has re-ruptured. He will need to have another surgery for this. He has been using Tylenol and ibuprofen for pain relief. Reaction to call the surgical office yesterday to confirm that they do not provide any pre-surgery narcotics and they had recommended that he call us for pain medications. Pain level is a 4. When moving around his pain is a 7 or 8. 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  He is not sure when surgery is yet, he is waiting to hear from scheduler.   He would also like to discuss his anxiety. He feels like his symptoms are very well-controlled at this time. An would like to discuss weaning off of his Paxil.   Review of Systems     Objective:   Physical Exam  Constitutional: He is oriented to person, place, and time. He appears well-developed and well-nourished.  HENT:  Head: Normocephalic and atraumatic.  Neurological: He is alert and oriented to person, place, and time.  Skin: Skin is warm and dry.  Psychiatric: He has a normal mood and affect. His behavior is normal.          Assessment & Plan:  Left inguinal recurrent hernia-he will have surgery hopefully in the next couple weeks. We did discuss that we could put him on a small amount of hydrocodone for a short period of time. Will give him 60 tabs to use up to 3 times a day. This should hopefully be enough until his surgery. Also recommended a hernia belt. Especially since he would like to continue to work. This provides some extra support for the area. 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 Also not to operate a car. He says he understands. Continue with ibuprofen but stopped  Tylenol since the hydrocodone are he has Tylenol in it. Next  Generalized anxiety disorder-he is actually doing fantastic. His CAD 7 score is 0 today. He rates his symptoms as not difficult at all. Wrote out a taper for him to come off his Paxil over the next 3 weeks.

## 2014-09-09 ENCOUNTER — Ambulatory Visit: Payer: Self-pay | Admitting: Sports Medicine

## 2014-12-17 ENCOUNTER — Encounter: Payer: Self-pay | Admitting: Family Medicine

## 2014-12-17 ENCOUNTER — Ambulatory Visit (INDEPENDENT_AMBULATORY_CARE_PROVIDER_SITE_OTHER): Payer: BLUE CROSS/BLUE SHIELD | Admitting: Family Medicine

## 2014-12-17 VITALS — BP 126/86 | HR 94 | Ht 68.0 in | Wt 155.0 lb

## 2014-12-17 DIAGNOSIS — N508 Other specified disorders of male genital organs: Secondary | ICD-10-CM | POA: Diagnosis not present

## 2014-12-17 DIAGNOSIS — G4726 Circadian rhythm sleep disorder, shift work type: Secondary | ICD-10-CM | POA: Diagnosis not present

## 2014-12-17 DIAGNOSIS — N50812 Left testicular pain: Secondary | ICD-10-CM

## 2014-12-17 MED ORDER — NABUMETONE 750 MG PO TABS: 750.0000 mg | ORAL_TABLET | Freq: Two times a day (BID) | ORAL | Status: DC | PRN

## 2014-12-17 MED ORDER — RAMELTEON 8 MG PO TABS: 8.0000 mg | ORAL_TABLET | Freq: Every day | ORAL | Status: DC

## 2014-12-17 NOTE — Progress Notes (Signed)
   Subjective:    Patient ID: Raymond PlumeJason L [name], male    DOB: 1986/04/11, 29 y.o.   MRN: [id number]  HPI Was working part-time and this spring his job changed his hours and the work load.  He has been working longer hours.  Now gets off of work at [employer]1AM and then now getting to bed around 3 AM and then only gets about 4 hours.  He mostly wakes up in the morning because the sons coming up and because he lives in the house with other family members and they start getting out and the noise and commotion house wakes him up as well. Wed had a migraine bc of lack of sleep.  Was sleeping well befoe this  Had tried trazodone last year he said it would make him feel really groggy.    Had hernia surgery repair a few months ago.  Was having pain after surgery and went back to the surgeon. They felt like everything was normal.  Though he has continued to have discomfort, mostly over the left testicle. He says it's tender to touch at times. Has felt a little swollen area on the testicle. Says even has had to change sleep position because of comfort. Often bothersome while at work. He has been taking anti-inflammatories over-the-counter for pain and would like to try something else. He feels those have not been effective.    Review of Systems     Objective:   Physical Exam  Constitutional: He is oriented to person, place, and time. He appears well-developed and well-nourished.  HENT:  Head: Normocephalic and atraumatic.  Neurological: He is alert and oriented to person, place, and time.  Skin: Skin is warm and dry.  Psychiatric: He has a normal mood and affect. His behavior is normal.          Assessment & Plan:  Insomnia, shift disorder - we discussed options. We discussed her to make his room environment is quite cool and dark as possible. Also discussed possibly a trial of Rozerem. I want him differently on something non-habit forming because of his medical history. He didn't do well with trazodone. We also  discussed that if his insurance will not cover Rozerem we might just try over-the-counter melatonin at least initially. He said he tried melatonin about a year ago and felt like it actually made him hyper.  Left testicular pain -I did not examine his testicles today. will refer to Urology. Discussed with him that he could have another diagnosis and related to his surgery such as a varicocele etc. Will prescribe a trial of Relafen or pain relief. 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

## 2014-12-20 ENCOUNTER — Telehealth: Payer: Self-pay | Admitting: Family Medicine

## 2014-12-20 NOTE — Telephone Encounter (Signed)
Received fax for pa on Rozerem sent through cover my meds waiting on authorization. - CF

## 2014-12-21 NOTE — Telephone Encounter (Signed)
[facility] denied [facility] due to patient has not tried 2 alternatives (alternatives: Ambien, Lunesta, and Sonata). Reference # [id number] - CF

## 2014-12-24 ENCOUNTER — Telehealth: Payer: Self-pay | Admitting: *Deleted

## 2014-12-24 MED ORDER — ESZOPICLONE 2 MG PO TABS: 2.0000 mg | ORAL_TABLET | Freq: Every evening | ORAL | Status: DC | PRN

## 2014-12-24 NOTE — Telephone Encounter (Signed)
Pt's ins will not cover the sleep medicine you gave him until he has tried and failed 2 others.  They are suggesting Ambien, Lunesta, or Sonata.  He is wanting to know if you could send one of these in instead.

## 2014-12-24 NOTE — Telephone Encounter (Signed)
Ok, new rx sent for [employer]

## 2014-12-24 NOTE — Telephone Encounter (Signed)
Pt notified of new rx.

## 2015-01-14 ENCOUNTER — Ambulatory Visit (INDEPENDENT_AMBULATORY_CARE_PROVIDER_SITE_OTHER): Payer: BLUE CROSS/BLUE SHIELD | Admitting: Family Medicine

## 2015-01-14 ENCOUNTER — Encounter: Payer: Self-pay | Admitting: Family Medicine

## 2015-01-14 VITALS — BP 140/87 | HR 90 | Ht 68.0 in | Wt 154.0 lb

## 2015-01-14 DIAGNOSIS — G4700 Insomnia, unspecified: Secondary | ICD-10-CM

## 2015-01-14 DIAGNOSIS — N509 Disorder of male genital organs, unspecified: Secondary | ICD-10-CM | POA: Diagnosis not present

## 2015-01-14 DIAGNOSIS — N50819 Testicular pain, unspecified: Secondary | ICD-10-CM

## 2015-01-14 MED ORDER — GABAPENTIN 100 MG PO CAPS: 100.0000 mg | ORAL_CAPSULE | Freq: Every day | ORAL | Status: DC

## 2015-01-14 MED ORDER — ESZOPICLONE 2 MG PO TABS: 2.0000 mg | ORAL_TABLET | Freq: Every evening | ORAL | Status: DC | PRN

## 2015-01-14 NOTE — Progress Notes (Signed)
   Subjective:    Patient ID: Raymond [name], male    DOB: 1986/02/07, 29 y.o.   MRN: [id number]  HPI Insomnia, shift disorder- I last saw him about a month ago. We had discussed starting him on Rozerem which is specifically approved for shift sleep disorder. It was not approved by his insurance. They require that he try to alternatives before it was covered. Thus we decided to switch him to [place]. He guarded try trazodone.  Said didn't have any S.E. Sleep about 7-8 hours.  Didn't  Have any hang over effect. Has been going to bed around midnight. Gets off work at 11 PM. He will be changing jobs at will be working at [employer].  Will be working 3rd shift.  For now will be 7PM to 7 AM. Will likely be that at about 6 months.   BP has been running borderline high when he has checked it elsewhere.   He was also having testicular pain or left saw him. We referred him to urology. They diagnosed him with chronic orchalgia and set him up for an ultrasound. They gave him a prescription for baclofen.  Review of Systems     Objective:   Physical Exam  Constitutional: He is oriented to person, place, and time. He appears well-developed and well-nourished.  HENT:  Head: Normocephalic and atraumatic.  Cardiovascular: Normal rate, regular rhythm and normal heart sounds.   Pulmonary/Chest: Effort normal and breath sounds normal.  Neurological: He is alert and oriented to person, place, and time.  Skin: Skin is warm and dry.  Psychiatric: He has a normal mood and affect. His behavior is normal.          Assessment & Plan:  Insomnia, shift disorder - doing well on lunesta.  F/U in 4 months.    Chronic orchalgia - Given a rx for baclofen. Says upset her stomach and burns and causes diarrhea. Says it make him feel loopy. He wonders if gabapentin can help with nerve pain.  We discussed potential S.E. Will start 100mg  at bedtime. Can increase to 300mg .  Warned about sedation.   GAD-7 score of 0 today.

## 2015-02-09 ENCOUNTER — Telehealth: Payer: Self-pay | Admitting: *Deleted

## 2015-02-09 NOTE — Telephone Encounter (Signed)
Pt's EC called and said that the gabapentin does not seem to be helping with his pain. I advised her to have him take 2 tabs at bedtime to see if this might help and that I would fwd this to Dr. Linford ArnoldMetheney for f/u.Loralee PacasBarkley, Leauna Sharber [place]

## 2015-02-09 NOTE — Telephone Encounter (Signed)
I agree go up to 2 tabs and can even increase to 3 tabs after 1 week if needed.

## 2015-02-10 NOTE — Telephone Encounter (Signed)
Pt informed. If not better told to call back.Raymond [name], Raymond [name]

## 2015-03-27 ENCOUNTER — Encounter: Payer: Self-pay | Admitting: Family Medicine

## 2015-03-28 MED ORDER — GABAPENTIN 300 MG PO CAPS: 300.0000 mg | ORAL_CAPSULE | Freq: Two times a day (BID) | ORAL | Status: DC

## 2015-04-08 ENCOUNTER — Ambulatory Visit (INDEPENDENT_AMBULATORY_CARE_PROVIDER_SITE_OTHER): Payer: BLUE CROSS/BLUE SHIELD | Admitting: Family Medicine

## 2015-04-08 ENCOUNTER — Encounter: Payer: Self-pay | Admitting: Family Medicine

## 2015-04-08 ENCOUNTER — Telehealth: Payer: Self-pay | Admitting: *Deleted

## 2015-04-08 VITALS — BP 109/71 | HR 73 | Ht 68.0 in | Wt 150.0 lb

## 2015-04-08 DIAGNOSIS — G4700 Insomnia, unspecified: Secondary | ICD-10-CM

## 2015-04-08 DIAGNOSIS — M549 Dorsalgia, unspecified: Secondary | ICD-10-CM

## 2015-04-08 MED ORDER — ESZOPICLONE 3 MG PO TABS: 3.0000 mg | ORAL_TABLET | Freq: Every evening | ORAL | Status: DC | PRN

## 2015-04-08 MED ORDER — GABAPENTIN 300 MG PO CAPS: 300.0000 mg | ORAL_CAPSULE | Freq: Three times a day (TID) | ORAL | Status: DC

## 2015-04-08 NOTE — Progress Notes (Signed)
   Subjective:    Patient ID: Raymond [name], male    DOB: Apr 25, 1986, 29 y.o.   MRN: [id number]  HPI Insomnia pt reports that the lunesta works however, he wakes up around 3 am.  Goes to bed around 9-10 PM.  Once wakes up then hard to go back to sleep. Takes about 30 min to back to sleep.  mid back pain -  pt stated that he works a 12 hour shift and does a lot of lifting and pushing at work,  and around 10 or 11 o'clock he start to get pain because the gabapentin has worn off.  Says financially can't change jobs.   He did know that he is getting over a cold. He did go to urgent care near his work and they gave him some cough syrup and some type of mouthwash. He says he's actually starting to feel little bit better today.   Review of Systems     Objective:   Physical Exam  Constitutional: He is oriented to person, place, and time. He appears well-developed and well-nourished.  HENT:  Head: Normocephalic and atraumatic.  Cardiovascular: Normal rate, regular rhythm and normal heart sounds.   Pulmonary/Chest: Effort normal and breath sounds normal.  Musculoskeletal:  Normal lumbar flexion and extension and rotation right and left. Nontender over the thoracic or lumbar spine. He does have some palpable tension and not along the left paraspinous muscles in the mid thoracic spine between the shoulder blade and the spine.  Neurological: He is alert and oriented to person, place, and time.  Skin: Skin is warm and dry.  Psychiatric: He has a normal mood and affect. His behavior is normal.          Assessment & Plan:  Insomnia-we'll increase his Lunesta 3 mg and see if this helps. Right now he is getting by about 5 hour consistent response. I think if we go up to 3 mg we can see if we get a little closer to 6 hours. Explained to him that it may not really give him a full 8 hours but we'll just have to see. Also just needs to continue to work on consistent bedtime. He's had several shift changes  of this has made this a little bit difficult. Continue with good sleep hygiene.  Mid Back Pain - will increase gabapentin to 3 times a day since he does feel like during his workday that it's wearing off. 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 We did discuss the long-term this is not going to be a good job for him in his back but he is aware of that and plans on making a change when she's able to get into a new apartment.

## 2015-04-08 NOTE — Telephone Encounter (Signed)
Called pt's pharmacy and spoke W/Lynn and informed her to cancel all refills remaining on the 2 mg tablets of the lunesta he has been given a new script for 3 mg tabs. She will cancel this.[name] [place]

## 2015-04-17 ENCOUNTER — Encounter: Payer: Self-pay | Admitting: Family Medicine

## 2015-04-18 ENCOUNTER — Other Ambulatory Visit: Payer: Self-pay | Admitting: Family Medicine

## 2015-04-18 MED ORDER — METAXALONE 800 MG PO TABS: 800.0000 mg | ORAL_TABLET | Freq: Three times a day (TID) | ORAL | Status: DC | PRN

## 2015-04-20 ENCOUNTER — Encounter: Payer: Self-pay | Admitting: Family Medicine

## 2015-04-20 ENCOUNTER — Ambulatory Visit (INDEPENDENT_AMBULATORY_CARE_PROVIDER_SITE_OTHER): Payer: BLUE CROSS/BLUE SHIELD | Admitting: Family Medicine

## 2015-04-20 VITALS — BP 126/83 | HR 89 | Wt 151.0 lb

## 2015-04-20 DIAGNOSIS — S29012A Strain of muscle and tendon of back wall of thorax, initial encounter: Secondary | ICD-10-CM | POA: Diagnosis not present

## 2015-04-20 MED ORDER — NAPROXEN 500 MG PO TABS: 500.0000 mg | ORAL_TABLET | Freq: Two times a day (BID) | ORAL | Status: DC

## 2015-04-20 MED ORDER — BACLOFEN 10 MG PO TABS: 10.0000 mg | ORAL_TABLET | Freq: Three times a day (TID) | ORAL | Status: DC | PRN

## 2015-04-20 NOTE — Assessment & Plan Note (Signed)
Pain consistent with rhomboid muscle strain. Plan for physical therapy baclofen and naproxen. Will use baclofen because patient is already tried methocarbamol and Flexeril in the past which did not help. Additionally we'll use a TENS unit. Return in 2-3 weeks.

## 2015-04-20 NOTE — Progress Notes (Signed)
   Subjective:    I'm seeing this patient as a consultation for:  Dr. Linford Arnold  CC: Left thoracic back pain  HPI: Patient has a 8-9 week history of moderate intermittent thoracic back pain. 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  He was doing reasonably well until 2-3 weeks ago when the pain worsened. At that time he increased his gabapentin from twice daily to 3 times daily. He notes this has not helped all that much. Additionally however yesterday he jolted his back pushing a heavy cart and has acutely worsened back pain. He denies any specific injury where he collided with anything. He notes the pain is severe and located in the left thoracic back. He has tried Tylenol and ibuprofen as well as his only been mildly helpful. He denies any radiating pain weakness or numbness fevers or chills. Patient denies any trouble breathing or shortness of breath.  Patient is in the past Flexeril and methocarbamol have not been helpful.  Past medical history, Surgical history, Family history not pertinant except as noted below, Social history, Allergies, and medications have been entered into the medical record, reviewed, and no changes needed.   Review of Systems: No headache, visual changes, nausea, vomiting, diarrhea, constipation, dizziness, abdominal pain, skin rash, fevers, chills, night sweats, weight loss, swollen lymph nodes, body aches, joint swelling, muscle aches, chest pain, shortness of breath, mood changes, visual or auditory hallucinations.   Objective:    Filed Vitals:   04/20/15 0819  BP: 126/83  Pulse: 89   General: Well Developed, well nourished, and in no acute distress.  Neuro/Psych: Alert and oriented x3, extra-ocular muscles intact, able to move all 4 extremities, sensation grossly intact. Skin: Warm and dry, no rashes noted.  Respiratory: Not using accessory  muscles, speaking in full sentences, trachea midline.  Cardiovascular: Pulses palpable, no extremity edema. Abdomen: Does not appear distended. MSK: Back: Nontender to cervical thoracic and lumbar midline. Tender palpation left rhomboid. Pain with scapular motion. Upper extremity reflexes and strength are equal and normal throughout. Sensation is intact throughout.  X-ray lumbar spine from January 2016 reviewed  No results found for this or any previous visit (from the past 24 hour(s)). No results found.  Impression and Recommendations:   This case required medical decision making of moderate complexity.

## 2015-04-20 NOTE — Patient Instructions (Signed)
Thank you for coming in today. Come back or go to the emergency room if you notice new weakness new numbness problems walking or bowel or bladder problems.  Take naproxen twice daily and use baclofen up to 3 times daily as needed. This medicine will make sleepy so do not take and drive and work.  Attend PT.  TENS UNIT: This is helpful for muscle pain and spasm.   Search and Purchase a TENS 7000 2nd edition at www.tenspros.com. It should be less than $30.     TENS unit instructions: Do not shower or bathe with the unit on Turn the unit off before removing electrodes or batteries If the electrodes lose stickiness add a drop of water to the electrodes after they are disconnected from the unit and place on plastic sheet. If you continued to have difficulty, call the TENS unit company to purchase more electrodes. Do not apply lotion on the skin area prior to use. Make sure the skin is clean and dry as this will help prolong the life of the electrodes. After use, always check skin for unusual red areas, rash or other skin difficulties. If there are any skin problems, does not apply electrodes to the same area. Never remove the electrodes from the unit by pulling the wires. Do not use the TENS unit or electrodes other than as directed. Do not change electrode placement without consultating your therapist or physician. Keep 2 fingers with between each electrode. Wear time ratio is 2:1, on to off times.    For example on for 30 minutes off for 15 minutes and then on for 30 minutes off for 15 minutes

## 2015-04-22 ENCOUNTER — Ambulatory Visit (INDEPENDENT_AMBULATORY_CARE_PROVIDER_SITE_OTHER): Payer: BLUE CROSS/BLUE SHIELD | Admitting: Family Medicine

## 2015-04-22 ENCOUNTER — Encounter: Payer: Self-pay | Admitting: Family Medicine

## 2015-04-22 VITALS — BP 128/78 | HR 89 | Wt 151.0 lb

## 2015-04-22 DIAGNOSIS — B351 Tinea unguium: Secondary | ICD-10-CM | POA: Diagnosis not present

## 2015-04-22 DIAGNOSIS — S29012A Strain of muscle and tendon of back wall of thorax, initial encounter: Secondary | ICD-10-CM | POA: Diagnosis not present

## 2015-04-22 MED ORDER — EFINACONAZOLE 10 % EX SOLN: 1.0000 "application " | Freq: Every day | CUTANEOUS | Status: DC

## 2015-04-22 MED ORDER — DULOXETINE HCL 30 MG PO CPEP: 30.0000 mg | ORAL_CAPSULE | Freq: Every day | ORAL | Status: DC

## 2015-04-22 NOTE — Progress Notes (Signed)
   Subjective:    Patient ID: Raymond [name], male    DOB: [birth year], 29 y.o.   MRN: [id number]  HPI Left great toenail - tip of nail has started to actually turn green. It's otherwise not painful or bothersome. He has to spray equipment at work and he does wear waterproof boots but unfortunately hot water does get inside and then he has to wear the boots for couple of hours before he is able to change and so he keeps his feet wet for period of time. He thinks it's a nail fungus and would like evaluated.  He came in approximately 2 days ago after he had jolted his back at work the day before pushing a heavy cart. He's had some underlying back pain, as per previous notes but severed an acute injury on top of that. He came in and saw our sports medicine provider and was diagnosed with Rhomboid muscle strain.  He was given baclofen to try but says it just made him feel sedated and didn't really provide any pain relief. He knows ultimately he needs to just find a new job and plans on doing so but wants to have something for pain relief until then she can continue to work. He is Re: On gabapentin.     Review of Systems     Objective:   Physical Exam  Constitutional: He is oriented to person, place, and time. He appears well-developed and well-nourished.  Neurological: He is alert and oriented to person, place, and time.  Skin: Skin is warm and dry.  Left great toenail distal edge is green. Clipping collect and sent for fungal culture  Psychiatric: He has a normal mood and affect. His behavior is normal.          Assessment & Plan:  Left great toenail onychomycosis - clipping collected for fungal culture. We will try treating with Jublia.   Rhomboid strain - discussed that I really don't want to put him on any narcotic medication. Will try cymbalta.  F/U in 6 weeks.  Can adjust dose if needed. Work on finding less strenous job.

## 2015-04-26 ENCOUNTER — Telehealth: Payer: Self-pay | Admitting: Family Medicine

## 2015-04-26 NOTE — Telephone Encounter (Signed)
Received fax for prior authorization on Jublia sent through cover my meds waiting on authorization. - CF

## 2015-04-29 NOTE — Telephone Encounter (Signed)
PA on Jublia was denied. For authorization the following must be met: proven by lab test (culture still pending), tx failure with 3 months of oral terbinafine or itraconazole as well as tx failure of 48 weeks of topical ciclopirox solution.  Will give to PCP for possible alternative.

## 2015-05-04 ENCOUNTER — Encounter: Payer: Self-pay | Admitting: Family Medicine

## 2015-05-04 ENCOUNTER — Telehealth: Payer: Self-pay | Admitting: Family Medicine

## 2015-05-04 MED ORDER — GABAPENTIN 300 MG PO CAPS: 300.0000 mg | ORAL_CAPSULE | Freq: Four times a day (QID) | ORAL | Status: DC

## 2015-05-04 MED ORDER — TERBINAFINE HCL 250 MG PO TABS: 250.0000 mg | ORAL_TABLET | Freq: Every day | ORAL | Status: DC

## 2015-05-04 NOTE — Telephone Encounter (Signed)
Called and spoke with Pt's wife, advised of new Rx. Advised Pt should avoid alcohol while taking this Rx, wife states he does not drink alcohol so that will "not be an issue." No further questions at this time.

## 2015-05-04 NOTE — Telephone Encounter (Signed)
Please call patient: Raymond [name] was denied for Raymond nail fungus. They will pay for oral Lamisil so I will send a prescription over to his pharmacy. Cannot drink alcohol with this medication.

## 2015-05-06 ENCOUNTER — Encounter: Payer: Self-pay | Admitting: Rehabilitative and Restorative Service Providers"

## 2015-05-06 ENCOUNTER — Ambulatory Visit (INDEPENDENT_AMBULATORY_CARE_PROVIDER_SITE_OTHER): Payer: BLUE CROSS/BLUE SHIELD | Admitting: Rehabilitative and Restorative Service Providers"

## 2015-05-06 DIAGNOSIS — Z7409 Other reduced mobility: Secondary | ICD-10-CM

## 2015-05-06 DIAGNOSIS — M549 Dorsalgia, unspecified: Secondary | ICD-10-CM

## 2015-05-06 DIAGNOSIS — M623 Immobility syndrome (paraplegic): Secondary | ICD-10-CM | POA: Diagnosis not present

## 2015-05-06 DIAGNOSIS — M2560 Stiffness of unspecified joint, not elsewhere classified: Secondary | ICD-10-CM

## 2015-05-06 DIAGNOSIS — R293 Abnormal posture: Secondary | ICD-10-CM

## 2015-05-06 NOTE — Patient Instructions (Signed)
Work on posture and alignment - keeping chest up and shoulder blades down and back.   Shoulder Blade Squeeze    Rotate shoulders back, then squeeze shoulder blades down and back.  Hold 10 sec. Repeat __10__ times. Do __several__ sessions per day. Can use swim noodle between shoulder blades.     Scapula Adduction With Pectoralis Stretch: Low - Standing   Shoulders at 45 hands even with shoulders, keeping weight through legs, shift weight forward until you feel pull or stretch through the front of your chest. Hold _30__ seconds. Do _3__ times, _2-4__ times per day.   Scapula Adduction With Pectoralis Stretch: Mid-Range - Standing   Shoulders at 90 elbows even with shoulders, keeping weight through legs, shift weight forward until you feel pull or strength through the front of your chest. Hold __30_ seconds. Do _3__ times, __2-4_ times per day.   Scapula Adduction With Pectoralis Stretch: High - Standing   Shoulders at 120 hands up high on the doorway, keeping weight on feet, shift weight forward until you feel pull or stretch through the front of your chest. Hold _30__ seconds. Do _3__ times, _2-3__ times per day.

## 2015-05-06 NOTE — Therapy (Signed)
[facility] [place] [address] [place], [place], [address] Phone: [phone]   Fax:  [phone]  Physical Therapy Evaluation  Patient Details  Name: Raymond [name] MRN: [id number] Date of Birth: 09-24-1985 Referring Provider:  Rodolph Bong, MD  Encounter Date: 05/06/2015      PT End of Session - 05/06/15 1305    Visit Number 1   Number of Visits 6   Date for PT Re-Evaluation 06/17/15   PT Start Time 1206   PT Stop Time 1300   PT Time Calculation (min) 54 min   Activity Tolerance Patient tolerated treatment well      Past Medical History  Diagnosis Date  . WEIGHT LOSS, ABNORMAL 09/30/2007    Qualifier: Diagnosis of  By: Thomos Lemons    . Spontaneous pneumothorax     Two in the past 2011    Past Surgical History  Procedure Laterality Date  . Chest tubes      There were no vitals filed for this visit.  Visit Diagnosis:  Mid back pain - Plan: PT plan of care cert/re-cert  Stiffness due to immobility - Plan: PT plan of care cert/re-cert  Abnormal posture - Plan: PT plan of care cert/re-cert  Impaired functional mobility and endurance - Plan: PT plan of care cert/re-cert      Subjective Assessment - 05/06/15 1158    Subjective Chae reports that he has pain with work activities. 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 Noticed pull and pain when lifting boxes 04/15/15. Seen by MD and treated with medication with some improvement. He has had symptoms for ~1 month before the injury. Continue to have symptoms including pain and tightness on Lt shoulder balde area.    Pertinent History Hernia repair 02/16; 02/15 - episode of this pain ~8 years resolved with PT, meds and time   How long can you sit comfortably? 2 min    How long can you stand comfortably? 1 min   How long can you walk comfortably? no limit   Diagnostic  tests none   Patient Stated Goals Decrease pain   Currently in Pain? Yes   Pain Score 4    Pain Location Back   Pain Orientation Left;Upper   Pain Descriptors / Indicators Aching;Burning   Pain Radiating Towards up into upper back    Pain Onset 1 to 4 weeks ago   Pain Frequency Constant   Aggravating Factors  work - lifting boxes; pushing boxes. Sitting; standing; reaching; lifting   Pain Relieving Factors heating pad; back massager with rollers; hot shower            [facility] PT Assessment - 05/06/15 0001    Assessment   Medical Diagnosis rhomboid strain   Onset Date/Surgical Date 04/15/15   Hand Dominance Right   Next MD Visit no appt scheduled   Prior Therapy 8 years ago   Precautions   Precautions None   Balance Screen   Has the patient fallen in the past 6 months No   Has the patient had a decrease in activity level because of a fear of falling?  No   Is the patient reluctant to leave their home because of a fear of falling?  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   Observation/Other Assessments   Focus on Therapeutic Outcomes (FOTO)  54%   Sensation   Additional Comments WFL's   AROM   Overall AROM Comments cervical ROM tightness noted at end ranges rotation and Lt lat flexion; shoulder ROM WFL's bilat pain with functional IR hand to opposite shoulder blade   Lumbar Extension 60%  painful    Lumbar - Left Side Bend 65%  painful   Lumbar - Right Rotation 55%  mild pain   Lumbar - Left Rotation 50%  painful   Strength   Overall Strength Comments 5/5 throughout pain with resisted Lt shoulder ER/IR/abduction/horiz abd   Palpation   Spinal mobility WFL's PA some tenderness with palpation along the Lt spinous  processes T3/4 through T8/9   Palpation comment muscular tightness through medial scapular area inc rhomboids; traps;                    [facility] Adult PT Treatment/Exercise - 05/06/15 0001    Self-Care   Self-Care --  postural correction   Shoulder Exercises: Standing   Retraction 10 reps  scap squeeze with noodle   Shoulder Exercises: Therapy Ball   Other Therapy Ball Exercises MFR ball on wall T spine musculature   Shoulder Exercises: Stretch   Corner Stretch Limitations 3 way door stretch 3 reps 30 sec hold   Cryotherapy   Number Minutes Cryotherapy 15 Minutes   Cryotherapy Location --  thoracic spine   Type of Cryotherapy Ice pack   Ultrasound   Ultrasound Location Lt medial scapular/rhomboid-trap area    Ultrasound Parameters 1 mHz; 1.5 w/cm2; 100% 8 min   Ultrasound Goals Pain  muscular tightness                 PT Education - 05/06/15 1249    Education provided Yes   Education Details postural correction; HEP   Person(s) Educated Patient   Methods Explanation;Demonstration;Tactile cues;Verbal cues;Handout   Comprehension Verbalized understanding;Returned demonstration;Verbal cues required;Tactile cues required             PT Long Term Goals - 05/06/15 1312    PT LONG TERM GOAL #1   Title Patient I in HEP for discharge 11/11/6   Time 6   Period Weeks   Status New   PT LONG TERM GOAL #2   Title Trunk ROM WFL's and painfree 06/17/15   Time 6   Period Weeks   PT LONG TERM GOAL #3   Title Improve thoracic posture to more upright position 06/17/15   Time 6   Period Weeks   Status New   PT LONG TERM GOAL #4   Title Patient to tolerate sitting and standing for 15-30 min 06/17/15   Time 6   Period Weeks   Status New   PT LONG TERM GOAL #5   Title Improve FOTO to </= 31% limitation 06/17/15   Time 6   Period Weeks   Status New               Plan - 05/06/15 1307    Clinical Impression Statement Patinet presents with ~3-4 week  history of Lt mid back pain. He has limited mobility; poor posture and alignment; pain with resistive testing; pain with palpation through thoracic paraspinals/rhomboids/traps; decresed functional ctivity level; decreased  endurance. He will benefit from PT to address problems identified and return patient to nromal functional activity level.    Pt will benefit from skilled therapeutic intervention in order to improve on the following deficits Postural dysfunction;Improper body mechanics;Decreased range of motion;Decreased endurance;Decreased activity tolerance;Increased fascial restricitons;Pain   Rehab Potential Good   PT Frequency 1x / week   PT Duration 6 weeks   PT Treatment/Interventions Patient/family education;ADLs/Self Care Home Management;Therapeutic exercise;Therapeutic activities;Manual techniques;Cryotherapy;Electrical Stimulation;Moist Heat;Ultrasound;Dry needling   PT Next Visit Plan review HEP; progress with posterior shoulder girdle strengthening; add manual work to thoracic area; continue postural education and correction   PT Home Exercise Plan HEP; postural correction; use of ice    Consulted and Agree with Plan of Care Patient         Problem List Patient Active Problem List   Diagnosis Date Noted  . Rhomboid muscle strain 04/20/2015  . Low back pain 08/12/2014  . GAD (generalized anxiety disorder) 09/25/2013  . Insomnia 09/25/2013  . Paresthesia of both hands 09/25/2013  . Pneumothorax 04/07/2010  . TRANSIENT DISORDER INITIATING/MAINTAINING SLEEP 12/02/2007  . OTHER THALASSEMIA 09/30/2007  . WEIGHT LOSS, ABNORMAL 09/30/2007  . PAIN IN THORACIC SPINE 02/12/2007    Celyn Rober Minion PT, MPH 05/06/2015, 1:19 PM  [facility] [address] [place], [place], [address] Phone: [phone]   Fax:  [phone]

## 2015-05-09 ENCOUNTER — Encounter: Payer: Self-pay | Admitting: Family Medicine

## 2015-05-11 ENCOUNTER — Ambulatory Visit (INDEPENDENT_AMBULATORY_CARE_PROVIDER_SITE_OTHER): Payer: BLUE CROSS/BLUE SHIELD | Admitting: Family Medicine

## 2015-05-11 ENCOUNTER — Encounter: Payer: Self-pay | Admitting: Family Medicine

## 2015-05-11 VITALS — BP 121/65 | HR 92 | Temp 98.5°F | Ht 68.0 in | Wt 153.0 lb

## 2015-05-11 DIAGNOSIS — R1032 Left lower quadrant pain: Secondary | ICD-10-CM

## 2015-05-11 MED ORDER — HYDROCODONE-ACETAMINOPHEN 5-325 MG PO TABS: 0.5000 | ORAL_TABLET | Freq: Two times a day (BID) | ORAL | Status: DC | PRN

## 2015-05-11 NOTE — Progress Notes (Signed)
Subjective:    Patient ID: Raymond [name], male    DOB: February 28, 1986, 29 y.o.   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 Had surgery in 10/2013.  Had repeat US in 09/2014 and they had recommend they ablate the nerve and try to remove scar tissuebut potential side effect include testicular atrophy or possible dec in potency and he may still want to have kids. He was not wanted to have the procedure done at that time. He is at the point though where the pain is extremely bothersome at work and makes it difficult for him to do his job. He is looking for new job is less physically strenuous. But in the interim he really would like some more pain control. He does take the gabapentin and says it's definitely helpful. He notices a big difference if he takes it versus if he doesn't.  Uses naproxen and tylenol regularly.     Review of Systems  BP 121/65 mmHg  Pulse 92  Temp(Src) 98.5 F (36.9 C)  Ht  (1.727 m)  Wt 153 lb (69.4 kg)  BMI 23.27 kg/m2    Allergies  Allergen Reactions  . Baclofen Nausea Only    Past Medical History  Diagnosis Date  . WEIGHT LOSS, ABNORMAL 09/30/2007    Qualifier: Diagnosis of  By: Thomos Lemons    . Spontaneous pneumothorax     Two in the past 2011    Past Surgical History  Procedure Laterality Date  . Chest tubes    . Inguinal hernia repair Left 10/12/13    Social History   Social History  . Marital Status: Single    Spouse Name: N/A  . Number of Children: N/A  . Years of Education: N/A   Occupational History  . Work at [employer] History Main Topics  . Smoking status: Former Smoker -- 0.75 packs/day    Types: E-cigarettes  . Smokeless tobacco: Not on file  . Alcohol Use: No     Comment: Stopped Drinking in December. Was drinking 2-3 beers a day.  . 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   Marland Kitchen Sexual Activity:   Partners: Female     Comment: No birth control   Other Topics Concern  . Not on file   Social History Narrative    Family History  Problem Relation Age of Onset  . Hypertension Father   . Anxiety disorder Mother   . Depression Mother   . Drug abuse Brother   . Coronary artery disease Maternal Grandfather   . ADD / ADHD Neg Hx   . Alcohol abuse Neg Hx   . Bipolar disorder Neg Hx   . Dementia Neg Hx   . OCD Neg Hx   . Paranoid behavior Neg Hx   . Schizophrenia Neg Hx   . Thyroid disease Neg Hx     Outpatient Encounter Prescriptions as of 05/11/2015  Medication Sig  . eszopiclone 3 MG TABS Take 1 tablet (3 mg total) by mouth at bedtime as needed for sleep. Take immediately before bedtime  . gabapentin (NEURONTIN) 300 MG capsule Take 1 capsule (300 mg total) by mouth 4 (four) times daily.  . naproxen (NAPROSYN) 500 MG tablet Take 1 tablet (500 mg total) by mouth 2 (two) times daily with a meal.  . terbinafine (LAMISIL) 250  MG tablet Take 1 tablet (250 mg total) by mouth daily.  Marland Kitchen HYDROcodone-acetaminophen (NORCO/VICODIN) 5-325 MG tablet Take 0.5-1 tablets by mouth 2 (two) times daily as needed for moderate pain.  . [DISCONTINUED] DULoxetine (CYMBALTA) 30 MG capsule Take 1 capsule (30 mg total) by mouth daily.  . [DISCONTINUED] Efinaconazole (JUBLIA) 10 % SOLN Apply 1 application topically daily. (Patient not taking: Reported on 05/06/2015)   No facility-administered encounter medications on file as of 05/11/2015.          Objective:   Physical Exam  Constitutional: He is oriented to person, place, and time. He appears well-developed and well-nourished.  Neurological: He is alert and oriented to person, place, and time.  Skin: Skin is warm and dry.  Psychiatric: He has a normal mood and affect. His behavior is normal.          Assessment & Plan:  Left groin pain -  I did give him a small quantity of hydrocodone 30 tabs. This will last him until his appointment. But did  explain to him that pain management will also look at other treatment options besides just pain medication. And ultimately I do think he needs to find a new job it's less physically strenuous in the long run and he is actively doing that. We'll go ahead and make referral to pain management. Ultimately he will need to probably have surgery but I do understand his concerns about this potential for affecting fertility especially at his age.

## 2015-05-13 ENCOUNTER — Encounter: Payer: Self-pay | Admitting: Physical Therapy

## 2015-05-14 ENCOUNTER — Encounter: Payer: Self-pay | Admitting: Family Medicine

## 2015-05-16 ENCOUNTER — Encounter: Payer: Self-pay | Admitting: Family Medicine

## 2015-05-17 MED ORDER — ZOLPIDEM TARTRATE 5 MG PO TABS: 5.0000 mg | ORAL_TABLET | Freq: Every evening | ORAL | Status: DC | PRN

## 2015-05-17 NOTE — Telephone Encounter (Signed)
New script sent for Ambien.

## 2015-05-18 ENCOUNTER — Encounter: Payer: Self-pay | Admitting: *Deleted

## 2015-05-18 ENCOUNTER — Emergency Department
Admission: EM | Admit: 2015-05-18 | Discharge: 2015-05-18 | Disposition: A | Discharge status: Home / Self Care | Payer: BLUE CROSS/BLUE SHIELD | Source: Home / Self Care | Attending: Family Medicine | Admitting: Family Medicine

## 2015-05-18 DIAGNOSIS — R110 Nausea: Secondary | ICD-10-CM

## 2015-05-18 DIAGNOSIS — R197 Diarrhea, unspecified: Secondary | ICD-10-CM | POA: Diagnosis not present

## 2015-05-18 DIAGNOSIS — R1084 Generalized abdominal pain: Secondary | ICD-10-CM

## 2015-05-18 LAB — POCT CBC W AUTO DIFF (K'VILLE URGENT CARE)

## 2015-05-18 LAB — COMPLETE METABOLIC PANEL WITH GFR
ALT: 17 U/L (ref 9–46)
AST: 21 U/L (ref 10–40)
Albumin: 5.1 g/dL (ref 3.6–5.1)
Alkaline Phosphatase: 64 U/L (ref 40–115)
BUN: 12 mg/dL (ref 7–25)
CO2: 27 mmol/L (ref 20–31)
Calcium: 9.7 mg/dL (ref 8.6–10.3)
Chloride: 99 mmol/L (ref 98–110)
Creat: 0.86 mg/dL (ref 0.60–1.35)
GFR, Est African American: >89 mL/min (ref >=60)
GFR, Est Non African American: >89 mL/min (ref >=60)
Glucose, Bld: 86 mg/dL (ref 65–99)
Potassium: 4.5 mmol/L (ref 3.5–5.3)
Sodium: 138 mmol/L (ref 135–146)
Total Bilirubin: 0.7 mg/dL (ref 0.2–1.2)
Total Protein: 7.1 g/dL (ref 6.1–8.1)

## 2015-05-18 LAB — LIPASE: Lipase: 13 U/L (ref 7–60)

## 2015-05-18 MED ORDER — PROMETHAZINE HCL 25 MG PO TABS: 25.0000 mg | ORAL_TABLET | Freq: Four times a day (QID) | ORAL | Status: DC | PRN

## 2015-05-18 NOTE — ED Provider Notes (Signed)
CSN: [id number]     Arrival date & time 05/18/15  0805 History   First MD Initiated Contact with Patient 05/18/15 0813     Chief Complaint  Patient presents with  . Diarrhea   (Consider location/radiation/quality/duration/timing/severity/associated sxs/prior Treatment) HPI  Pt is a 29yo male accompanied by his mother, presenting to [facility] with c/o nausea, generalized abdominal pain, and diarrhea for 1-2 days.  Pt states he has been on Lamisil PO for about 2 weeks for possible foot fungus.  Pt noticed symptoms after he started to take the medication.  Pt's mother believes he has had stomach "issues" for longer than 2 days.  He was seen by his PCP, Dr. Linford ArnoldMetheney, for possible foot fungus and states there are cultures pending.  Abdominal pain has been generalized and burning at times, mild to moderate in severity yesterday.  He had about 15 episodes of loose to watery diarrhea w/o blood but minimal mucous yesterday.  Diarrhea has slowed down today due to pt starting OTC imodium yesterday.  He has had a decreased appetite due to the nausea.  His mother also notes loud "stomach noises." Denies fever, chills, vomiting, or difficulty urinating. Denies recent travel, sick contacts, or recent antibiotic use.  Abdominal surgical hx significant for 2 hernia repairs, last inguinal hernia repair was 10/12/13.   Past Medical History  Diagnosis Date  . WEIGHT LOSS, ABNORMAL 09/30/2007    Qualifier: Diagnosis of  By: Thomos LemonsBowen DO, Karen    . Spontaneous pneumothorax     Two in the past 2011   Past Surgical History  Procedure Laterality Date  . Chest tubes    . Inguinal hernia repair Left 10/12/13   Family History  Problem Relation Age of Onset  . Hypertension Father   . Anxiety disorder Mother   . Depression Mother   . Drug abuse Brother   . Coronary artery disease Maternal Grandfather   . ADD / ADHD Neg Hx   . Alcohol abuse Neg Hx   . Bipolar disorder Neg Hx   . Dementia Neg Hx   . OCD Neg Hx   . Paranoid  behavior Neg Hx   . Schizophrenia Neg Hx   . Thyroid disease Neg Hx    Social History  Substance Use Topics  . Smoking status: Former Smoker -- 0.75 packs/day    Types: E-cigarettes  . Smokeless tobacco: None  . Alcohol Use: No     Comment: Stopped Drinking in December. Was drinking 2-3 beers a day.    Review of Systems  Constitutional: Positive for appetite change and fatigue. Negative for fever and chills.  HENT: Negative for congestion and sore throat.   Respiratory: Negative for cough and shortness of breath.   Cardiovascular: Negative for chest pain and palpitations.  Gastrointestinal: Positive for nausea, abdominal pain and diarrhea. Negative for vomiting.  Genitourinary: Negative for dysuria, urgency, frequency and decreased urine volume.  Musculoskeletal: Negative for myalgias and arthralgias.  Skin: Negative for rash.  Neurological: Positive for weakness ( generalized).    Allergies  Baclofen  Home Medications   Prior to Admission medications   Medication Sig Start Date End Date Taking? Authorizing Provider  gabapentin (NEURONTIN) 300 MG capsule Take 1 capsule (300 mg total) by mouth 4 (four) times daily. 05/04/15   Agapito Gamesatherine D Metheney, MD  HYDROcodone-acetaminophen (NORCO/VICODIN) 5-325 MG tablet Take 0.5-1 tablets by mouth 2 (two) times daily as needed for moderate pain. 05/11/15   Agapito Gamesatherine D Metheney, MD  naproxen (NAPROSYN) 500 MG  tablet Take 1 tablet (500 mg total) by mouth 2 (two) times daily with a meal. 04/20/15   Rodolph Bong, MD  promethazine (PHENERGAN) 25 MG tablet Take 1 tablet (25 mg total) by mouth every 6 (six) hours as needed for nausea or vomiting. 05/18/15   Junius Finner, PA-C  terbinafine (LAMISIL) 250 MG tablet Take 1 tablet (250 mg total) by mouth daily. 05/04/15   Agapito Games, MD  zolpidem (AMBIEN) 5 MG tablet Take 1 tablet (5 mg total) by mouth at bedtime as needed for sleep. 05/17/15   Agapito Games, MD   Meds Ordered and  Administered this Visit  Medications - No data to display  BP 127/84 mmHg  Pulse 74  Temp(Src) 97.9 F (36.6 C) (Oral)  Resp 16  Ht  (1.778 m)  Wt 147 lb (66.679 kg)  BMI 21.09 kg/m2  SpO2 98% No data found.   Physical Exam  Constitutional: He appears well-developed and well-nourished.  HENT:  Head: Normocephalic and atraumatic.  Mouth/Throat: Oropharynx is clear and moist.  Eyes: Conjunctivae are normal. No scleral icterus.  Neck: Normal range of motion. Neck supple.  Cardiovascular: Normal rate, regular rhythm and normal heart sounds.   Pulmonary/Chest: Effort normal and breath sounds normal. No respiratory distress. He has no wheezes. He has no rales. He exhibits no tenderness.  Abdominal: Soft. Bowel sounds are normal. He exhibits no distension and no mass. There is no tenderness. There is no rebound and no guarding.  Musculoskeletal: Normal range of motion.  Neurological: He is alert.  Skin: Skin is warm and dry.  Nursing note and vitals reviewed.   ED Course  Procedures (including critical care time)  Labs Review Labs Reviewed  COMPLETE METABOLIC PANEL WITH GFR  LIPASE  POCT CBC W AUTO DIFF ([facility])    Imaging Review No results found.    MDM   1. Generalized abdominal pain   2. Nausea   3. Diarrhea, unspecified type    Pt is a 29yo male c/o 1-2 day hx of abdominal pain, nausea and diarrhea.  No recent travel, sick contacts or antibiotic use, however, he did notice symptoms after starting PO Lamisil.  Pt stopped taking 2 days ago.  Pt appears well, non-toxic, is afebrile. Abdomen is soft, non-distended, non-tender. Not concerned for surgical abdomen at this time. Symptoms likely viral vs side effect of Lamisil.  Abdominal labs: CBC, CMP, and Lipase drawn today. CBC- unremarkable. Pt safe for discharge home. Encouraged to stay well hydrated.  The 'BRAT' diet is suggested, then progress to diet as tolerated as symptoms abate. Call if  bloody stools, persistent diarrhea, vomiting, fever or abdominal pain. F/u with PCP in 3-4 days for recheck of symptoms. Discussed symptoms that warrant emergent care in the ED. Patient and his mother verbalized understanding and agreement with treatment plan.     Junius Finner, PA-C 05/18/15 [phone]

## 2015-05-18 NOTE — Discharge Instructions (Signed)
Call 911 or go to closest ER if symptoms worsen including severe abdominal pain, unable to keep down fluids, unable to urinate, or other new concerning symptoms develop.  Please discontinue use of oral Lamisil and let your PCP, Dr. Linford ArnoldMetheney, know you have been having abdominal pain, nausea, and diarrhea, while taking the medication.  There are over the counter Lamisil creams you may start instead, or Dr. Linford ArnoldMetheney may recommend another alternative of oral Lamisil.

## 2015-05-18 NOTE — ED Notes (Signed)
Pt c/o diarrhea x 1 day with some nausea x 1 day. Denies fever, vomiting, or hematochezia or black stool.

## 2015-05-19 ENCOUNTER — Telehealth: Payer: Self-pay | Admitting: *Deleted

## 2015-05-20 ENCOUNTER — Ambulatory Visit (INDEPENDENT_AMBULATORY_CARE_PROVIDER_SITE_OTHER): Payer: BLUE CROSS/BLUE SHIELD | Admitting: Physical Therapy

## 2015-05-20 DIAGNOSIS — Z7409 Other reduced mobility: Secondary | ICD-10-CM

## 2015-05-20 DIAGNOSIS — R293 Abnormal posture: Secondary | ICD-10-CM

## 2015-05-20 DIAGNOSIS — M2560 Stiffness of unspecified joint, not elsewhere classified: Secondary | ICD-10-CM

## 2015-05-20 DIAGNOSIS — M623 Immobility syndrome (paraplegic): Secondary | ICD-10-CM | POA: Diagnosis not present

## 2015-05-20 DIAGNOSIS — M549 Dorsalgia, unspecified: Secondary | ICD-10-CM

## 2015-05-20 NOTE — Therapy (Signed)
[facility] [place] [address] [place], [place], [address] Phone: [phone]   Fax:  [phone]  Physical Therapy Treatment  Patient Details  Name: Raymond [name] MRN: [id number] Date of Birth: March 05, 1986 No Data Recorded  Encounter Date: 05/20/2015      PT End of Session - 05/20/15 0808    Visit Number 2   Number of Visits 6   Date for PT Re-Evaluation 06/17/15   PT Start Time 0805   PT Stop Time 0858   PT Time Calculation (min) 53 min   Activity Tolerance Patient tolerated treatment well      Past Medical History  Diagnosis Date  . WEIGHT LOSS, ABNORMAL 09/30/2007    Qualifier: Diagnosis of  By: Thomos Lemons    . Spontaneous pneumothorax     Two in the past 2011    Past Surgical History  Procedure Laterality Date  . Chest tubes    . Inguinal hernia repair Left 10/12/13    There were no vitals filed for this visit.  Visit Diagnosis:  Mid back pain  Stiffness due to immobility  Abnormal posture  Impaired functional mobility and endurance      Subjective Assessment - 05/20/15 0809    Subjective Pt reports his pain has changed since last visit.  Feels ultrasound treatment helped.  Pain hasn't been as bad as it has been.     Currently in Pain? Yes   Pain Score 3    Pain Location Back   Pain Orientation Left;Upper   Pain Descriptors / Indicators Burning  "catching"    Aggravating Factors  work - shoveling, lifting   Pain Relieving Factors heating pad, ice            [facility] PT Assessment - 05/20/15 0001    Assessment   Medical Diagnosis rhomboid strain   Onset Date/Surgical Date 04/15/15   Hand Dominance Right   Next MD Visit no appt scheduled   Prior Therapy 8 years ago            [facility] Adult PT Treatment/Exercise - 05/20/15 0001    Exercises   Exercises Shoulder;Neck   Neck Exercises: Seated   Neck Retraction 10 reps, 5 sec hold   Neck Retraction Limitations tactile cues, demo given    Shoulder Exercises: Standing   External Rotation Strengthening;10 reps;Theraband  2 sets   Theraband Level (Shoulder External Rotation) Level 1 (Yellow)   Retraction 10 reps  scap squeeze with noodle, 5 sec hold   Other Standing Exercises Head press with head and back against pool noodle, 5 sec hold x 10 reps    Shoulder Exercises: ROM/Strengthening   UBE (Upper Arm Bike) L1: alterating forward/ backward 1 min each way up to 4 min   Shoulder Exercises: Stretch   Corner Stretch Limitations 3 way door stretch 3 reps 30 sec hold   Cryotherapy   Number Minutes Cryotherapy 12 Minutes   Cryotherapy Location --  thoracic spine   Type of Cryotherapy Ice pack   Ultrasound   Ultrasound Location Lt thoracic paraspinals/ rhomboid   Ultrasound Parameters 100%, 1.0 mHz, 1.2 w/cm2 x 8 min    Ultrasound Goals Pain  muscular tightness   Manual Therapy   Manual Therapy Taping   Manual therapy comments Ktape to Lt thoracic paraspinals and rhomboid to decrease pain/ reduce residual edema.    Neck Exercises: Stretches   Upper Trapezius Stretch 2 reps;20 seconds   Levator Stretch 2 reps;20 seconds  PT Long Term Goals - 05/06/15 1312    PT LONG TERM GOAL #1   Title Patient I in HEP for discharge 11/11/6   Time 6   Period Weeks   Status New   PT LONG TERM GOAL #2   Title Trunk ROM WFL's and painfree 06/17/15   Time 6   Period Weeks   PT LONG TERM GOAL #3   Title Improve thoracic posture to more upright position 06/17/15   Time 6   Period Weeks   Status New   PT LONG TERM GOAL #4   Title Patient to tolerate sitting and standing for 15-30 min 06/17/15   Time 6   Period Weeks   Status New   PT LONG TERM GOAL #5   Title Improve FOTO to </= 31% limitation 06/17/15   Time 6   Period Weeks   Status New               Plan - 05/20/15 0856    Clinical Impression Statement Pt tolerated all exercises with minimal to no increase in pain.  Pt reported  resolution of pain by end of session. Pt required some visual /tactile cues for posture correction.  Pt making good progress towards goals.    Pt will benefit from skilled therapeutic intervention in order to improve on the following deficits Postural dysfunction;Improper body mechanics;Decreased range of motion;Decreased endurance;Decreased activity tolerance;Increased fascial restricitons;Pain   Rehab Potential Good   PT Frequency 1x / week   PT Duration 6 weeks   PT Treatment/Interventions Patient/family education;ADLs/Self Care Home Management;Therapeutic exercise;Therapeutic activities;Manual techniques;Cryotherapy;Electrical Stimulation;Moist Heat;Ultrasound;Dry needling   PT Next Visit Plan review HEP; progress with posterior shoulder girdle strengthening; add manual work to thoracic area; continue postural education and correction   Consulted and Agree with Plan of Care Patient        Problem List Patient Active Problem List   Diagnosis Date Noted  . Rhomboid muscle strain 04/20/2015  . Low back pain 08/12/2014  . GAD (generalized anxiety disorder) 09/25/2013  . Insomnia 09/25/2013  . Paresthesia of both hands 09/25/2013  . Pneumothorax 04/07/2010  . TRANSIENT DISORDER INITIATING/MAINTAINING SLEEP 12/02/2007  . OTHER THALASSEMIA 09/30/2007  . WEIGHT LOSS, ABNORMAL 09/30/2007  . PAIN IN THORACIC SPINE 02/12/2007   Mayer CamelJennifer Carlson-Long, PTA 05/20/2015 1:20 PM  [facility] [address] [place], [place], [address] Phone: [phone]   Fax:  [phone]  Name: Raymond [name] MRN: [id number] Date of Birth: 13-Feb-1986

## 2015-05-23 LAB — CULTURE, FUNGUS WITHOUT SMEAR

## 2015-05-25 ENCOUNTER — Encounter: Payer: Self-pay | Admitting: Family Medicine

## 2015-05-27 ENCOUNTER — Encounter: Payer: Self-pay | Admitting: Physical Therapy

## 2015-06-03 ENCOUNTER — Ambulatory Visit (INDEPENDENT_AMBULATORY_CARE_PROVIDER_SITE_OTHER): Payer: BLUE CROSS/BLUE SHIELD | Admitting: Physical Therapy

## 2015-06-03 DIAGNOSIS — Z7409 Other reduced mobility: Secondary | ICD-10-CM | POA: Diagnosis not present

## 2015-06-03 DIAGNOSIS — M623 Immobility syndrome (paraplegic): Secondary | ICD-10-CM | POA: Diagnosis not present

## 2015-06-03 DIAGNOSIS — M2560 Stiffness of unspecified joint, not elsewhere classified: Secondary | ICD-10-CM

## 2015-06-03 DIAGNOSIS — R293 Abnormal posture: Secondary | ICD-10-CM

## 2015-06-03 NOTE — Therapy (Addendum)
[place], [place], [address] Phone: [phone]   Fax:  [phone]  Physical Therapy Treatment  Patient Details  Name: Raymond [name] MRN: [id number] Date of Birth: 1986-07-23 No Data Recorded  Encounter Date: 06/03/2015      PT End of Session - 06/03/15 1544    Visit Number 3   Number of Visits 6   Date for PT Re-Evaluation 06/17/15   PT Start Time 1455   PT Stop Time 1534   PT Time Calculation (min) 39 min   Activity Tolerance Patient tolerated treatment well;No increased pain      Past Medical History  Diagnosis Date  . WEIGHT LOSS, ABNORMAL 09/30/2007    Qualifier: Diagnosis of  By: Esmeralda Arthur    . Spontaneous pneumothorax     Two in the past 2011    Past Surgical History  Procedure Laterality Date  . Chest tubes    . Inguinal hernia repair Left 10/12/13    There were no vitals filed for this visit.  Visit Diagnosis:  Stiffness due to immobility  Abnormal posture  Impaired functional mobility and endurance      Subjective Assessment - 06/03/15 1544    Subjective Pt reports he hasn't had pain since 4 days ago.  Has been focusing on posture, not as much exercise.    Pertinent History Hernia repair 02/16; 02/15 - episode of this pain ~8 years resolved with PT, meds and time   Currently in Pain? No/denies   Pain Score --            [facility] PT Assessment - 06/03/15 0001    Assessment   Medical Diagnosis rhomboid strain   Onset Date/Surgical Date 04/15/15   Hand Dominance Right   Next MD Visit PRN   Observation/Other Assessments   Focus on Therapeutic Outcomes (FOTO)  1%   AROM   Lumbar Extension WNL   Lumbar - Left Side Bend WNL   Lumbar - Right Rotation WNL   Lumbar - Left Rotation WNL           [facility] Adult PT Treatment/Exercise - 06/03/15 0001    Shoulder Exercises: Prone   Other Prone Exercises scap retraction in goal post position x 5 reps (tactile  cues); then goal post position to superman with axial extension x 8 reps.    Shoulder Exercises: Standing   External Rotation Strengthening;Both;Theraband;20 reps   Theraband Level (Shoulder External Rotation) Level 3 (Green)   Extension Strengthening;Both;20 reps;Theraband   Theraband Level (Shoulder Extension) Level 3 (Green)   Row [employer];Theraband   Theraband Level (Shoulder Row) Level 3 (Green)   Other Standing Exercises D2 flexion with red band x 10 reps each arm.    Shoulder Exercises: ROM/Strengthening   UBE (Upper Arm Bike) L3: alternating each min up to 4 min.  (standing)   Shoulder Exercises: Stretch   Corner Stretch Limitations 3 way door stretch 3 reps 30 sec hold   Cross Chest Stretch 2 reps;20 seconds           PT Education - 06/03/15 1508    Education provided Yes   Education Details HEP    Person(s) Educated Patient   Methods Explanation;Handout   Comprehension Verbalized understanding;Returned demonstration             PT Long Term Goals - 06/03/15 1532    PT LONG TERM GOAL #1   Title Patient I in HEP for discharge 11/11/6  Time 6   Period Weeks   Status On-going   PT LONG TERM GOAL #2   Title Trunk ROM WFL's and painfree 06/17/15   Time 6   Period Weeks   Status Achieved   PT LONG TERM GOAL #3   Title Improve thoracic posture to more upright position 06/17/15   Time 6   Period Weeks   Status Achieved   PT LONG TERM GOAL #4   Title Patient to tolerate sitting and standing for 15-30 min 06/17/15   Time 6   Period Weeks   Status Achieved   PT LONG TERM GOAL #5   Title Improve FOTO to </= 31% limitation 06/17/15   Time 6   Period Weeks   Status Achieved  1% limited               Plan - 06/03/15 1516    Clinical Impression Statement Pt tolerated all exercises without increase in pain.  Continues to required tactile cues for posture and form.  Pt is satisfied with current level of function but requests to have POC left in  place in case of flare with new exercise program.    Pt will benefit from skilled therapeutic intervention in order to improve on the following deficits Postural dysfunction;Improper body mechanics;Decreased range of motion;Decreased endurance;Decreased activity tolerance;Increased fascial restricitons;Pain   Rehab Potential Good   PT Frequency 1x / week   PT Duration 6 weeks   PT Treatment/Interventions Patient/family education;ADLs/Self Care Home Management;Therapeutic exercise;Therapeutic activities;Manual techniques;Cryotherapy;Electrical Stimulation;Moist Heat;Ultrasound;Dry needling   PT Next Visit Plan If pt returns, review HEP and continue progressive strengthening /stretching to shoulder girdle. If pt doesn't return before 06/17/15, pt agreeable to d/c to HEP.         Problem List Patient Active Problem List   Diagnosis Date Noted  . Rhomboid muscle strain 04/20/2015  . Low back pain 08/12/2014  . GAD (generalized anxiety disorder) 09/25/2013  . Insomnia 09/25/2013  . Paresthesia of both hands 09/25/2013  . Pneumothorax 04/07/2010  . TRANSIENT DISORDER INITIATING/MAINTAINING SLEEP 12/02/2007  . OTHER THALASSEMIA 09/30/2007  . WEIGHT LOSS, ABNORMAL 09/30/2007  . PAIN IN THORACIC SPINE 02/12/2007    Kerin Perna, PTA 06/03/2015 4:39 PM  [place], [place], [address] Phone: [phone]   Fax:  [phone]  Name: Raymond [name] MRN: [id number] Date of Birth: 08-09-1985    PHYSICAL THERAPY DISCHARGE SUMMARY  Visits from Start of Care: 3  Current functional level related to goals / functional outcomes: Progressing well and returned to normal functional activities. I in HEP   Remaining deficits: Needs to continue HEP    Education / Equipment: HEP/theraband  Plan: Patient agrees to discharge.  Patient goals were met. Patient is being discharged due to meeting the stated rehab  goals.  ?????    Celyn P. Helene Kelp PT, MPH 06/27/2015 2:04 PM

## 2015-06-03 NOTE — Patient Instructions (Addendum)
Resisted External Rotation: in Neutral - Bilateral   PALMS UP Sit or stand, tubing in both hands, elbows at sides, bent to 90, forearms forward. Pinch shoulder blades together and rotate forearms out. Keep elbows at sides. Repeat __10__ times per set. Do _2-3___ sets per session. Do _2-3___ sessions per day.   Low Row: Standing   Face anchor, feet shoulder width apart. Palms up, pull arms back, squeezing shoulder blades together. Repeat 10__ times per set. Do 2-3__ sets per session. Do 2-3__ sessions per week. Anchor Height: Waist     Strengthening: Resisted Extension   Hold tubing in right hand, arm forward. Pull arm back, elbow straight. Repeat _10___ times per set. Do 2-3____ sets per session. Do 2-3____ sessions per day.  Scapular Retraction: Abduction (Prone)    Lie with upper arms straight out from sides, elbows bent to 90. Pinch shoulder blades together and raise arms a few inches from floor. Repeat _10___ times per set. Do _1___ sets per session. Do __1__ sessions per day, 1x/wk * can do this position to superman position (but do not shorten neck)   [facility] [address] [place], [place] [address]  [phone] (office) [phone] (fax)

## 2015-07-03 ENCOUNTER — Encounter: Payer: Self-pay | Admitting: Family Medicine

## 2015-07-04 ENCOUNTER — Encounter: Payer: Self-pay | Admitting: Family Medicine

## 2015-07-04 ENCOUNTER — Ambulatory Visit (INDEPENDENT_AMBULATORY_CARE_PROVIDER_SITE_OTHER): Payer: BLUE CROSS/BLUE SHIELD | Admitting: Family Medicine

## 2015-07-04 VITALS — BP 124/80 | HR 100 | Temp 98.4°F | Resp 18 | Wt 153.7 lb

## 2015-07-04 DIAGNOSIS — R1032 Left lower quadrant pain: Secondary | ICD-10-CM

## 2015-07-04 MED ORDER — PREGABALIN 150 MG PO CAPS: 150.0000 mg | ORAL_CAPSULE | Freq: Two times a day (BID) | ORAL | Status: DC

## 2015-07-04 NOTE — Progress Notes (Signed)
   Subjective:    Patient ID: Raymond PlumeJason L [name], male    DOB: 1986-03-28, 29 y.o.   MRN: [id number]  HPI           Follow-up Can't afford to continue to see Pain Management and meds rx'd aren't working    Left inguinal pain- Saw pian management and they started him on lyrica.  Thinks it may have helped the first few days but then felt like the effect was wearing off. He was taking 75mg  BID.  Denies any sedation, blurry vision, etc on the medication.   They also discussed doing a nerve block but he is fearful of the risks. He is not interested in doing that at this time.  He says he was also given vicodin.  He wants to try a higher dose of Lyrica if possible. He is ok with not taking the vicodin.  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     Review of Systems     Objective:   Physical Exam  Constitutional: He is oriented to person, place, and time. He appears well-developed and well-nourished.  HENT:  Head: Normocephalic and atraumatic.  Cardiovascular: Normal rate, regular rhythm and normal heart sounds.   Pulmonary/Chest: Effort normal and breath sounds normal.  Neurological: He is alert and oriented to person, place, and time.  Skin: Skin is warm and dry.  Psychiatric: He has a normal mood and affect. His behavior is normal.          Assessment & Plan:   chronic inguinal pain s/p repair.  -  Will increase Lyrica to 150mg .   F/U in 1-2 months.   Discussed that I will not Rx narcotics for his pain. He can return to Pain management for that. He is looking for a new less strenous job which I think would be best for him.

## 2015-07-05 ENCOUNTER — Ambulatory Visit: Payer: Self-pay | Admitting: Family Medicine

## 2015-07-18 ENCOUNTER — Encounter: Payer: Self-pay | Admitting: Family Medicine

## 2015-07-20 MED ORDER — ZOLPIDEM TARTRATE 10 MG PO TABS: 5.0000 mg | ORAL_TABLET | Freq: Every evening | ORAL | Status: DC | PRN

## 2015-07-20 NOTE — Telephone Encounter (Signed)
Please call pharmacy and cancel scrip for ambien and remaining refills on the lunesta. He has been filling both.

## 2015-07-20 NOTE — Telephone Encounter (Signed)
Received phone call from Pharmacy regarding new Ambien Rx. Pharmacy states Pt has been filling his old [place] Rx since May (last fill date: 07/03/15), and has been filling the Ambien for the last two months (last fill: 06/14/15). Pharmacy questions which Rx Pt is to be taking. Advised Pharmacy to hold both Rx's until PCP decides which Rx (if any) Pt is to take.

## 2015-07-26 NOTE — Telephone Encounter (Signed)
Ok to call pharmacy and they can release the Ambien to the patient. Pleas tell them to cancel any refills left on the lunesta.  Ok to call patient and let him know.

## 2015-08-30 ENCOUNTER — Encounter: Payer: Self-pay | Admitting: Family Medicine

## 2015-08-30 ENCOUNTER — Ambulatory Visit (INDEPENDENT_AMBULATORY_CARE_PROVIDER_SITE_OTHER): Payer: BLUE CROSS/BLUE SHIELD | Admitting: Family Medicine

## 2015-08-30 VITALS — BP 127/71 | HR 86 | Wt 153.0 lb

## 2015-08-30 DIAGNOSIS — M545 Low back pain, unspecified: Secondary | ICD-10-CM

## 2015-08-30 DIAGNOSIS — S161XXA Strain of muscle, fascia and tendon at neck level, initial encounter: Secondary | ICD-10-CM

## 2015-08-30 MED ORDER — PREGABALIN 100 MG PO CAPS: 100.0000 mg | ORAL_CAPSULE | Freq: Three times a day (TID) | ORAL | Status: DC

## 2015-08-30 NOTE — Progress Notes (Signed)
   Subjective:    Patient ID: Raymond [name], male    DOB: 04-09-1986, 30 y.o.   MRN: [id number]  HPI Here today to follow-up on chronic low back pain-we originally referred him to pain management but he was unable to afford to go. He was switched to Lyrica and felt like he got a much better response compared to the gabapentin. He is now taking 150 mg twice a day. He does have a time period where he feels like it wears off a little bit in the mornings. He's also been expressing more neck pain recently. It seems to be triggering some headaches. He points to the top part of the cervical spine. No recent injury or trauma. Tylenol and Naprosyn as needed for pain relief. And has been trying some the pelvis.  Review of Systems     Objective:   Physical Exam  Constitutional: He is oriented to person, place, and time. He appears well-developed and well-nourished.  HENT:  Head: Normocephalic and atraumatic.  Cardiovascular: Normal rate, regular rhythm and normal heart sounds.   Pulmonary/Chest: Effort normal and breath sounds normal.  Musculoskeletal:  Cervical spine with normal flexion and extension and rotation right and left. Nontender.  Neurological: He is alert and oriented to person, place, and time.  Skin: Skin is warm and dry.  Psychiatric: He has a normal mood and affect. His behavior is normal.          Assessment & Plan:  Chronic back pain-we'll continue with Lyrica. We'll switch to 100 mg 3 times a day since he feels like the 150 twice a day is actually wearing off between doses. That we we are continuing his current dose but spreading it out. Follow-up in 4 months. He has any problems in the interim and please let me know.  Cervical strain-offered to give him a handout with some stretches to do on his own but says he started in doing his own stretches. He thinks he may just need to adjust his pillow. I demonstrated a couple of new exercises for him to add to his routine. He started  using some Tylenol and/or ibuprofen as needed.

## 2015-09-20 ENCOUNTER — Encounter: Payer: Self-pay | Admitting: Family Medicine

## 2015-09-21 MED ORDER — PREGABALIN 50 MG PO CAPS: 150.0000 mg | ORAL_CAPSULE | Freq: Three times a day (TID) | ORAL | Status: DC

## 2015-09-22 ENCOUNTER — Other Ambulatory Visit: Payer: Self-pay | Admitting: *Deleted

## 2015-09-22 MED ORDER — PREGABALIN 50 MG PO CAPS: 150.0000 mg | ORAL_CAPSULE | Freq: Three times a day (TID) | ORAL | Status: DC

## 2015-09-27 ENCOUNTER — Other Ambulatory Visit: Payer: Self-pay | Admitting: Family Medicine

## 2015-09-27 MED ORDER — PREGABALIN 150 MG PO CAPS: 150.0000 mg | ORAL_CAPSULE | Freq: Three times a day (TID) | ORAL | Status: DC

## 2015-11-22 ENCOUNTER — Other Ambulatory Visit: Payer: Self-pay | Admitting: Family Medicine

## 2015-11-29 ENCOUNTER — Encounter: Payer: Self-pay | Admitting: Family Medicine

## 2015-11-29 ENCOUNTER — Ambulatory Visit (INDEPENDENT_AMBULATORY_CARE_PROVIDER_SITE_OTHER): Payer: BLUE CROSS/BLUE SHIELD | Admitting: Family Medicine

## 2015-11-29 VITALS — BP 127/79 | HR 85 | Wt 141.0 lb

## 2015-11-29 DIAGNOSIS — S161XXA Strain of muscle, fascia and tendon at neck level, initial encounter: Secondary | ICD-10-CM

## 2015-11-29 MED ORDER — KETOROLAC TROMETHAMINE 60 MG/2ML IJ SOLN
60.0000 mg | Freq: Once | INTRAMUSCULAR | Status: AC
  Administered 2015-11-29: 60 mg via INTRAMUSCULAR

## 2015-11-29 MED ORDER — NABUMETONE 500 MG PO TABS: 500.0000 mg | ORAL_TABLET | Freq: Two times a day (BID) | ORAL | Status: DC | PRN

## 2015-11-29 MED ORDER — CYCLOBENZAPRINE HCL 10 MG PO TABS: 10.0000 mg | ORAL_TABLET | Freq: Three times a day (TID) | ORAL | Status: DC | PRN

## 2015-11-29 NOTE — Addendum Note (Signed)
Addended by: Deno EtienneBARKLEY, Pax Reasoner L on: 11/29/2015 12:29 PM   Modules accepted: Orders

## 2015-11-29 NOTE — Progress Notes (Signed)
Subjective:    Patient ID: Raymond [name], male    DOB: [birth year], 30 y.o.   MRN: [id number]  HPI Patient comes in today complaining of a severe throbbing headache that has lasted for the last 3 days. Says it's mostly in the top of his head. He feels like it's originating from his cervical spine which she feels is very tight and difficult to move. He denies any recent upper respiratory infection or cold. Whether sinus symptoms. No fevers chills or sweats. He rates his pain a 7 out of 10. He's been taking Tylenol and ibuprofen and tried his girlfriend Zanaflex without significant relief. He's also been using heat and ice.  He did note that his grandfather is on hospice and is likely to pass away this week. Also let go from his job last week.  Review of Systems  BP 127/79 mmHg  Pulse 85  Wt 141 lb (63.957 kg)  SpO2 99%    Allergies  Allergen Reactions  . Baclofen Nausea Only  . Prednisone Other (See Comments)    Extremely Irritable.     Past Medical History  Diagnosis Date  . WEIGHT LOSS, ABNORMAL 09/30/2007    Qualifier: Diagnosis of  By: Thomos LemonsBowen DO, Karen    . Spontaneous pneumothorax     Two in the past 2011    Past Surgical History  Procedure Laterality Date  . Chest tubes    . Inguinal hernia repair Left 10/12/13    Social History   Social History  . Marital Status: Single    Spouse Name: N/A  . Number of Children: N/A  . Years of Education: N/A   Occupational History  . Work at [employer]    Social History Main Topics  . Smoking status: Former Smoker -- 0.75 packs/day    Types: E-cigarettes  . Smokeless tobacco: Not on file  . Alcohol Use: No     Comment: Stopped Drinking in December. Was drinking 2-3 beers a day.  . 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   Marland Kitchen. Sexual Activity:    Partners: Female     Comment: No birth control   Other Topics Concern  . Not on file   Social History  Narrative    Family History  Problem Relation Age of Onset  . Hypertension Father   . Anxiety disorder Mother   . Depression Mother   . Drug abuse Brother   . Coronary artery disease Maternal Grandfather   . ADD / ADHD Neg Hx   . Alcohol abuse Neg Hx   . Bipolar disorder Neg Hx   . Dementia Neg Hx   . OCD Neg Hx   . Paranoid behavior Neg Hx   . Schizophrenia Neg Hx   . Thyroid disease Neg Hx     Outpatient Encounter Prescriptions as of 11/29/2015  Medication Sig  . LYRICA 150 MG capsule take 1 capsule by mouth three times a day  . cyclobenzaprine (FLEXERIL) 10 MG tablet Take 1 tablet (10 mg total) by mouth 3 (three) times daily as needed for muscle spasms.  . nabumetone (RELAFEN) 500 MG tablet Take 1 tablet (500 mg total) by mouth 2 (two) times daily as needed.   No facility-administered encounter medications on file as of 11/29/2015.          Objective:   Physical Exam  Constitutional: He is oriented to person, place, and time. He  appears well-developed and well-nourished.  HENT:  Head: Normocephalic and atraumatic.  Right Ear: External ear normal.  Left Ear: External ear normal.  Nose: Nose normal.  Mouth/Throat: Oropharynx is clear and moist.  TMs and canals are clear.   Eyes: Conjunctivae and EOM are normal. Pupils are equal, round, and reactive to light.  Neck: Neck supple. No thyromegaly present.  Cardiovascular: Normal rate, regular rhythm and normal heart sounds.   Pulmonary/Chest: Effort normal and breath sounds normal.  Musculoskeletal:  Decreased cervical flexion and extension. He had pain with both maneuvers. He also has decreased rotation to the left on the left compared to the right. Shoulders with normal range of motion but he says it does make his neck uncomfortable. Nontender directly over the cervical spine. He is tender at the occiput bilaterally.  Lymphadenopathy:    He has no cervical adenopathy.  Neurological: He is alert and oriented to person,  place, and time. No cranial nerve deficit. He exhibits normal muscle tone. Coordination normal.  Skin: Skin is warm and dry.  Psychiatric: He has a normal mood and affect. His behavior is normal.          Assessment & Plan:  Cervical spasm triggering headaches-given Toradol IM injection today. I discussed putting him on a 5 day taper prednisone but he says prednisone tends to make him extremely irritable so he would prefer not to take it. Also sent her prescription for Flexeril for him to use. He is taken it before this may at least help him sleep at bedtime. Given a handout with some stretches to do on his own at home. Encouraged him to use his heating pad which she says does provide a little bit of relief and then do his stretches and then repeat the heating pad. Encouraged him to do this twice a day and see if he starts to improve. Also recommend more formal physical therapy. I really think this is where he would get the most benefit quickly. We'll go ahead and place referral.

## 2015-12-06 ENCOUNTER — Ambulatory Visit: Payer: Self-pay | Admitting: Family Medicine

## 2016-01-03 ENCOUNTER — Ambulatory Visit (INDEPENDENT_AMBULATORY_CARE_PROVIDER_SITE_OTHER): Payer: BLUE CROSS/BLUE SHIELD | Admitting: Family Medicine

## 2016-01-03 ENCOUNTER — Encounter: Payer: Self-pay | Admitting: Family Medicine

## 2016-01-03 DIAGNOSIS — S40812A Abrasion of left upper arm, initial encounter: Secondary | ICD-10-CM

## 2016-01-03 DIAGNOSIS — S40811A Abrasion of right upper arm, initial encounter: Secondary | ICD-10-CM | POA: Diagnosis not present

## 2016-01-03 DIAGNOSIS — S40211A Abrasion of right shoulder, initial encounter: Secondary | ICD-10-CM | POA: Diagnosis not present

## 2016-01-03 DIAGNOSIS — G4700 Insomnia, unspecified: Secondary | ICD-10-CM

## 2016-01-03 DIAGNOSIS — M549 Dorsalgia, unspecified: Secondary | ICD-10-CM

## 2016-01-03 DIAGNOSIS — S80811S Abrasion, right lower leg, sequela: Secondary | ICD-10-CM

## 2016-01-03 DIAGNOSIS — L089 Local infection of the skin and subcutaneous tissue, unspecified: Secondary | ICD-10-CM

## 2016-01-03 DIAGNOSIS — S92501A Displaced unspecified fracture of right lesser toe(s), initial encounter for closed fracture: Secondary | ICD-10-CM

## 2016-01-03 DIAGNOSIS — S92591A Other fracture of right lesser toe(s), initial encounter for closed fracture: Secondary | ICD-10-CM

## 2016-01-03 MED ORDER — PREGABALIN 150 MG PO CAPS: ORAL_CAPSULE | ORAL | Status: DC

## 2016-01-03 MED ORDER — ESZOPICLONE 3 MG PO TABS: 3.0000 mg | ORAL_TABLET | Freq: Every evening | ORAL | Status: DC | PRN

## 2016-01-03 MED ORDER — SILVER SULFADIAZINE 1 % EX CREA: 1.0000 "application " | TOPICAL_CREAM | Freq: Every day | CUTANEOUS | Status: DC
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
## 2016-01-06 ENCOUNTER — Telehealth: Payer: Self-pay

## 2016-01-06 ENCOUNTER — Telehealth: Payer: Self-pay | Admitting: Family Medicine

## 2016-01-06 MED ORDER — HYDROCODONE-ACETAMINOPHEN 5-325 MG PO TABS: 1.0000 | ORAL_TABLET | Freq: Four times a day (QID) | ORAL | Status: DC | PRN

## 2016-01-06 NOTE — Telephone Encounter (Signed)
Meiko's wife called and states he is out of his pain medication. She would like Dr Linford ArnoldMetheney to prescribe more. Please advise.

## 2016-01-06 NOTE — Telephone Encounter (Signed)
Patient's wife advised
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
## 2016-01-06 NOTE — Telephone Encounter (Signed)
Will change to hydrocodone which is a step down. Prescribed 30 tabs. After that if he is not improving then we will need to either get him in with pain management, get him into physical therapy or have him see one of the sports medicine doctors.

## 2016-01-08 ENCOUNTER — Encounter: Payer: Self-pay | Admitting: Family Medicine

## 2016-01-08 DIAGNOSIS — M546 Pain in thoracic spine: Secondary | ICD-10-CM

## 2016-01-08 NOTE — Telephone Encounter (Signed)
Ok to place referral. See if they can do it downstairs.

## 2016-01-09 NOTE — Telephone Encounter (Signed)
Referral placed.

## 2016-01-10 ENCOUNTER — Ambulatory Visit (INDEPENDENT_AMBULATORY_CARE_PROVIDER_SITE_OTHER): Payer: BLUE CROSS/BLUE SHIELD | Admitting: Family Medicine

## 2016-01-10 ENCOUNTER — Encounter: Payer: Self-pay | Admitting: Family Medicine

## 2016-01-10 VITALS — BP 136/86 | HR 93 | Wt 143.0 lb

## 2016-01-10 DIAGNOSIS — T148XXA Other injury of unspecified body region, initial encounter: Secondary | ICD-10-CM | POA: Insufficient documentation

## 2016-01-10 DIAGNOSIS — M546 Pain in thoracic spine: Secondary | ICD-10-CM | POA: Diagnosis not present

## 2016-01-10 DIAGNOSIS — T148 Other injury of unspecified body region: Secondary | ICD-10-CM | POA: Diagnosis not present

## 2016-01-10 NOTE — Patient Instructions (Signed)
Thank you for coming in today. Attend PT.  Return in 1 week.  Do we to dry dressing on the deeper wound on the elbow.  Continue cream and non-adherent dressings on the other wounds.  Return sooner if needed.   Wet to dry dressing: Apply a damp gauze to the wound on the elbow.  Remove the dressing daily.  This should cause the wound to pull off the dead tissue and bleed.  It should help it heal sooner.    Dressing Change A dressing is a material placed over wounds. It keeps the wound clean, dry, and protected from further injury. This provides an environment that favors wound healing.  BEFORE YOU BEGIN  Get your supplies together. Things you may need include:  Saline solution.  Flexible gauze dressing.  Medicated cream.  Tape.  Gloves.  Abdominal dressing pads.  Gauze squares.  Plastic bags.  Take pain medicine 30 minutes before the dressing change if you need it.  Take a shower before you do the first dressing change of the day. Use plastic wrap or a plastic bag to prevent the dressing from getting wet. REMOVING YOUR OLD DRESSING   Wash your hands with soap and water. Dry your hands with a clean towel.  Put on your gloves.  Remove any tape.  Carefully remove the old dressing. If the dressing sticks, you may dampen it with warm water to loosen it, or follow your caregiver's specific directions.  Remove any gauze or packing tape that is in your wound.  Take off your gloves.  Put the gloves, tape, gauze, or any packing tape into a plastic bag. CHANGING YOUR DRESSING  Open the supplies.  Take the cap off the saline solution.  Open the gauze package so that the gauze remains on the inside of the package.  Put on your gloves.  Clean your wound as told by your caregiver.  If you have been told to keep your wound dry, follow those instructions.  Your caregiver may tell you to do one or more of the following:  Pick up the gauze. Pour the saline solution  over the gauze. Squeeze out the extra saline solution.  Put medicated cream or other medicine on your wound if you have been told to do so.  Put the solution soaked gauze only in your wound, not on the skin around it.  Pack your wound loosely or as told by your caregiver.  Put dry gauze on your wound.  Put abdominal dressing pads over the dry gauze if your wet gauze soaks through.  Tape the abdominal dressing pads in place so they will not fall off. Do not wrap the tape completely around the affected part (arm, leg, abdomen).  Wrap the dressing pads with a flexible gauze dressing to secure it in place.  Take off your gloves. Put them in the plastic bag with the old dressing. Tie the bag shut and throw it away.  Keep the dressing clean and dry until your next dressing change.  Wash your hands. SEEK MEDICAL CARE IF:  Your skin around the wound looks red.  Your wound feels more tender or sore.  You see pus in the wound.  Your wound smells bad.  You have a fever.  Your skin around the wound has a rash that itches and burns.  You see black or yellow skin in your wound that was not there before.  You feel nauseous, throw up, and feel very tired.   This information  is not intended to replace advice given to you by your health care provider. Make sure you discuss any questions you have with your health care provider.   Document Released: 08/30/2004 Document Revised: 10/15/2011 Document Reviewed: 06/04/2011 Elsevier Interactive Patient Education [employer]2016 Elsevier Inc.
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
## 2016-01-11 ENCOUNTER — Encounter: Payer: Self-pay | Admitting: Family Medicine

## 2016-01-12 ENCOUNTER — Other Ambulatory Visit: Payer: Self-pay | Admitting: Family Medicine

## 2016-01-12 ENCOUNTER — Ambulatory Visit (INDEPENDENT_AMBULATORY_CARE_PROVIDER_SITE_OTHER): Payer: BLUE CROSS/BLUE SHIELD | Admitting: Physical Therapy

## 2016-01-12 ENCOUNTER — Encounter: Payer: Self-pay | Admitting: Physical Therapy

## 2016-01-12 DIAGNOSIS — M2560 Stiffness of unspecified joint, not elsewhere classified: Secondary | ICD-10-CM

## 2016-01-12 DIAGNOSIS — M623 Immobility syndrome (paraplegic): Secondary | ICD-10-CM | POA: Diagnosis not present

## 2016-01-12 DIAGNOSIS — R293 Abnormal posture: Secondary | ICD-10-CM | POA: Diagnosis not present

## 2016-01-12 DIAGNOSIS — M546 Pain in thoracic spine: Secondary | ICD-10-CM | POA: Diagnosis not present

## 2016-01-12 DIAGNOSIS — Z7409 Other reduced mobility: Secondary | ICD-10-CM

## 2016-01-12 MED ORDER — PREGABALIN 200 MG PO CAPS: 200.0000 mg | ORAL_CAPSULE | Freq: Three times a day (TID) | ORAL | Status: DC
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
## 2016-01-12 NOTE — Telephone Encounter (Signed)
Please call the pharmacy and see if he picked up the prescription that was sent on May 30 fourth May 31. It was for 120 tabs in which case he should have enough for at least a couple of more weeks and then we can change his prescription to the 200 mg 3 times a day for convenience.

## 2016-01-12 NOTE — Telephone Encounter (Signed)
Patient did not fill the May 30 th prescription. Sent in new prescription with directions to discontinue all other Lyrica prescriptions.

## 2016-01-12 NOTE — Patient Instructions (Signed)
AROM: Elbow Flexion / Extension   Use heat a couple times a day ~ 20 min.    [place] [phone]    With left hand palm up, gently bend elbow as far as possible. Then straighten arm as far as possible. Repeat __10__ times per set. Do __1__ sets per session. Do __2-3__ sessions per day.  Side Twist, Supine (Non-Weight Bearing)    Lie on back, feet flat on floor. Slowly rock knees to one side, hold 20-30 sec. Then rotate to the other side and hold. Allow lower back to rotate slightly. Repeat _2__ times per session. Do _2-3__ sessions per day.  Half Lean, Sitting    Sit on chair. Lean forward. Hold _20-30__ seconds. To return, put forearms on knees and push up. For greater stretch reach arms toward back legs of chair.  Repeat _2__ times per session. Do _2-3__ sessions per day.   Copyright  VHI. All rights reserved.    Trigger Point Dry Needling  . What is Trigger Point Dry Needling (DN)? o DN is a physical therapy technique used to treat muscle pain and dysfunction. Specifically, DN helps deactivate muscle trigger points (muscle knots).  o A thin filiform needle is used to penetrate the skin and stimulate the underlying trigger point. The goal is for a local twitch response (LTR) to occur and for the trigger point to relax. No medication of any kind is injected during the procedure.   . What Does Trigger Point Dry Needling Feel Like?  o The procedure feels different for each individual patient. Some patients report that they do not actually feel the needle enter the skin and overall the process is not painful. Very mild bleeding may occur. However, many patients feel a deep cramping in the muscle in which the needle was inserted. This is the local twitch response.   Marland Kitchen. How Will I feel after the treatment? o Soreness is normal, and the onset of soreness may not occur for a few hours. Typically this soreness does not last longer than two days.  o Bruising is uncommon, however; ice can  be used to decrease any possible bruising.  o In rare cases feeling tired or nauseous after the treatment is normal. In addition, your symptoms may get worse before they get better, this period will typically not last longer than 24 hours.   . What Can I do After My Treatment? o Increase your hydration by drinking more water for the next 24 hours. o You may place ice or heat on the areas treated that have become sore, however, do not use heat on inflamed or bruised areas. Heat often brings more relief post needling. o You can continue your regular activities, but vigorous activity is not recommended initially after the treatment for 24 hours. o DN is best combined with other physical therapy such as strengthening, stretching, and other therapies.

## 2016-01-16 ENCOUNTER — Ambulatory Visit (INDEPENDENT_AMBULATORY_CARE_PROVIDER_SITE_OTHER): Payer: BLUE CROSS/BLUE SHIELD | Admitting: Rehabilitative and Restorative Service Providers"

## 2016-01-16 ENCOUNTER — Encounter: Payer: Self-pay | Admitting: Rehabilitative and Restorative Service Providers"

## 2016-01-16 ENCOUNTER — Encounter: Payer: Self-pay | Admitting: Family Medicine

## 2016-01-16 ENCOUNTER — Ambulatory Visit (INDEPENDENT_AMBULATORY_CARE_PROVIDER_SITE_OTHER): Payer: BLUE CROSS/BLUE SHIELD | Admitting: Family Medicine

## 2016-01-16 ENCOUNTER — Ambulatory Visit (INDEPENDENT_AMBULATORY_CARE_PROVIDER_SITE_OTHER): Payer: BLUE CROSS/BLUE SHIELD

## 2016-01-16 VITALS — BP 137/90 | HR 94 | Wt 143.0 lb

## 2016-01-16 DIAGNOSIS — S6991XA Unspecified injury of right wrist, hand and finger(s), initial encounter: Secondary | ICD-10-CM

## 2016-01-16 DIAGNOSIS — S92511A Displaced fracture of proximal phalanx of right lesser toe(s), initial encounter for closed fracture: Secondary | ICD-10-CM | POA: Diagnosis not present

## 2016-01-16 DIAGNOSIS — M25541 Pain in joints of right hand: Secondary | ICD-10-CM

## 2016-01-16 DIAGNOSIS — M2560 Stiffness of unspecified joint, not elsewhere classified: Secondary | ICD-10-CM

## 2016-01-16 DIAGNOSIS — S92911A Unspecified fracture of right toe(s), initial encounter for closed fracture: Secondary | ICD-10-CM | POA: Diagnosis not present

## 2016-01-16 DIAGNOSIS — M623 Immobility syndrome (paraplegic): Secondary | ICD-10-CM

## 2016-01-16 DIAGNOSIS — X58XXXA Exposure to other specified factors, initial encounter: Secondary | ICD-10-CM | POA: Diagnosis not present

## 2016-01-16 DIAGNOSIS — M546 Pain in thoracic spine: Secondary | ICD-10-CM | POA: Diagnosis not present

## 2016-01-16 DIAGNOSIS — Z7409 Other reduced mobility: Secondary | ICD-10-CM

## 2016-01-16 DIAGNOSIS — T148 Other injury of unspecified body region: Secondary | ICD-10-CM | POA: Diagnosis not present

## 2016-01-16 DIAGNOSIS — R293 Abnormal posture: Secondary | ICD-10-CM

## 2016-01-16 DIAGNOSIS — S92919A Unspecified fracture of unspecified toe(s), initial encounter for closed fracture: Secondary | ICD-10-CM | POA: Insufficient documentation

## 2016-01-16 DIAGNOSIS — T148XXA Other injury of unspecified body region, initial encounter: Secondary | ICD-10-CM

## 2016-01-16 MED ORDER — HYDROCODONE-ACETAMINOPHEN 5-325 MG PO TABS: 1.0000 | ORAL_TABLET | Freq: Four times a day (QID) | ORAL | Status: DC | PRN
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
## 2016-01-16 NOTE — Patient Instructions (Addendum)
Trigger Point Dry Needling  . What is Trigger Point Dry Needling (DN)? o DN is a physical therapy technique used to treat muscle pain and dysfunction. Specifically, DN helps deactivate muscle trigger points (muscle knots).  o A thin filiform needle is used to penetrate the skin and stimulate the underlying trigger point. The goal is for a local twitch response (LTR) to occur and for the trigger point to relax. No medication of any kind is injected during the procedure.   . What Does Trigger Point Dry Needling Feel Like?  o The procedure feels different for each individual patient. Some patients report that they do not actually feel the needle enter the skin and overall the process is not painful. Very mild bleeding may occur. However, many patients feel a deep cramping in the muscle in which the needle was inserted. This is the local twitch response.   . How Will I feel after the treatment? o Soreness is normal, and the onset of soreness may not occur for a few hours. Typically this soreness does not last longer than two days.  o Bruising is uncommon, however; ice can be used to decrease any possible bruising.  o In rare cases feeling tired or nauseous after the treatment is normal. In addition, your symptoms may get worse before they get better, this period will typically not last longer than 24 hours.   . What Can I do After My Treatment? o Increase your hydration by drinking more water for the next 24 hours. o You may place ice or heat on the areas treated that have become sore, however, do not use heat on inflamed or bruised areas. Heat often brings more relief post needling. o You can continue your regular activities, but vigorous activity is not recommended initially after the treatment for 24 hours. o DN is best combined with other physical therapy such as strengthening, stretching, and other therapies.    Sleeping on Back  Place pillow under knees. A pillow with cervical support and a  roll around waist are also helpful. Copyright  VHI. All rights reserved.  Sleeping on Side Place pillow between knees. Use cervical support under neck and a roll around waist as needed. Copyright  VHI. All rights reserved.   Sleeping on Stomach   If this is the only desirable sleeping position, place pillow under lower legs, and under stomach or chest as needed.  Posture - Sitting   Sit upright, head facing forward. Try using a roll to support lower back. Keep shoulders relaxed, and avoid rounded back. Keep hips level with knees. Avoid crossing legs for long periods. Stand to Sit / Sit to Stand   To sit: Bend knees to lower self onto front edge of chair, then scoot back on seat. To stand: Reverse sequence by placing one foot forward, and scoot to front of seat. Use rocking motion to stand up.   Work Height and Reach  Ideal work height is no more than 2 to 4 inches below elbow level when standing, and at elbow level when sitting. Reaching should be limited to arm's length, with elbows slightly bent.  Bending  Bend at hips and knees, not back. Keep feet shoulder-width apart.    Posture - Standing   Good posture is important. Avoid slouching and forward head thrust. Maintain curve in low back and align ears over shoul- ders, hips over ankles.  Alternating Positions   Alternate tasks and change positions frequently to reduce fatigue and muscle tension. Take   rest breaks. Computer Work   Position work to Art gallery manager. Use proper work and seat height. Keep shoulders back and down, wrists straight, and elbows at right angles. Use chair that provides full back support. Add footrest and lumbar roll as needed.  Getting Into / Out of Car  Lower self onto seat, scoot back, then bring in one leg at a time. Reverse sequence to get out.  Dressing  Lie on back to pull socks or slacks over feet, or sit and bend leg while keeping back straight.    Housework - Sink  Place one foot  on ledge of cabinet under sink when standing at sink for prolonged periods.   Pushing / Pulling  Pushing is preferable to pulling. Keep back in proper alignment, and use leg muscles to do the work.  Deep Squat   Squat and lift with both arms held against upper trunk. Tighten stomach muscles without holding breath. Use smooth movements to avoid jerking.  Avoid Twisting   Avoid twisting or bending back. Pivot around using foot movements, and bend at knees if needed when reaching for articles.  Carrying Luggage   Distribute weight evenly on both sides. Use a cart whenever possible. Do not twist trunk. Move body as a unit.   Lifting Principles .Maintain proper posture and head alignment. .Slide object as close as possible before lifting. .Move obstacles out of the way. .Test before lifting; ask for help if too heavy. .Tighten stomach muscles without holding breath. .Use smooth movements; do not jerk. .Use legs to do the work, and pivot with feet. .Distribute the work load symmetrically and close to the center of trunk. .Push instead of pull whenever possible.   Ask For Help   Ask for help and delegate to others when possible. Coordinate your movements when lifting together, and maintain the low back curve.  Log Roll   Lying on back, bend left knee and place left arm across chest. Roll all in one movement to the right. Reverse to roll to the left. Always move as one unit. Housework - Sweeping  Use long-handled equipment to avoid stooping.   Housework - Wiping  Position yourself as close as possible to reach work surface. Avoid straining your back.  Laundry - Unloading Wash   To unload small items at bottom of washer, lift leg opposite to arm being used to reach.  Gardening - Raking  Move close to area to be raked. Use arm movements to do the work. Keep back straight and avoid twisting.     Cart  When reaching into cart with one arm, lift opposite leg to keep  back straight.   Getting Into / Out of Bed  Lower self to lie down on one side by raising legs and lowering head at the same time. Use arms to assist moving without twisting. Bend both knees to roll onto back if desired. To sit up, start from lying on side, and use same move-ments in reverse. Housework - Vacuuming  Hold the vacuum with arm held at side. Step back and forth to move it, keeping head up. Avoid twisting.   Laundry - Armed forces training and education officer so that bending and twisting can be avoided.   Laundry - Unloading Dryer  Squat down to reach into clothes dryer or use a reacher.  Gardening - Weeding / Psychiatric nurse or Kneel. Knee pads may be helpful.  Lying on golf balls on either side of your spine at the base of your skull 1-2 min   Flexors, Supine    Lie on back, head on small, rolled towel. Tip chin down. Tighten muscles in back of throat. Hold _10-15__ seconds. Repeat __5-10_ times per session. Do _4-5__ sessions per day.   Axial Extension (Chin Tuck)    Pull chin in and lengthen back of neck. Hold _10___ seconds while counting out loud. Repeat __5-10__ times. Do __several __ sessions per day.   Nodding yes/no

## 2016-01-16 NOTE — Patient Instructions (Addendum)
Thank you for coming in today. Continue the buddy tape.  It is ok to wear regular shoes with your toe fracture.  Recheck fracture in 2 weeks with me.

## 2016-01-16 NOTE — Progress Notes (Signed)
Quick Note:  No fracture seen on x-ray ______

## 2016-01-16 NOTE — Therapy (Signed)
[facility] [place] [address] [place], [place], [address] Phone: [phone]   Fax:  [phone]  Physical Therapy Treatment  Patient Details  Name: Raymond [name] MRN: [id number] Date of Birth: [birth year] Referring Provider: Dr. Clementeen Graham  Encounter Date: 01/16/2016      PT End of Session - 01/16/16 0804    Visit Number 2   Number of Visits 8   Date for PT Re-Evaluation 02/09/16   PT Start Time 0804   PT Stop Time 0855   PT Time Calculation (min) 51 min   Activity Tolerance Patient tolerated treatment well      Past Medical History  Diagnosis Date  . WEIGHT LOSS, ABNORMAL 09/30/2007    Qualifier: Diagnosis of  By: Thomos Lemons    . Spontaneous pneumothorax     Two in the past 2011    Past Surgical History  Procedure Laterality Date  . Chest tubes    . Inguinal hernia repair Left 10/12/13    There were no vitals filed for this visit.      Subjective Assessment - 01/16/16 0804    Subjective Continues to have the pain through the thoracic spine. Symtpoms are both sides Rt and Lt depending on how he sits or stands. Neck has been "killing" him.    Currently in Pain? Yes   Pain Score 3    Pain Location Thoracic   Pain Orientation Right;Left   Pain Descriptors / Indicators Aching;Tightness   Pain Onset 1 to 4 weeks ago   Pain Frequency Constant            [facility] PT Assessment - 01/16/16 0001    Assessment   Medical Diagnosis Rt thoracic pain   Referring Provider Dr. Clementeen Graham   Onset Date/Surgical Date 12/31/15   Hand Dominance Right   Next MD Visit  week    Prior Therapy not for this    AROM   Cervical Flexion 38  tight and pulling    Cervical Extension 42  pain end range   Cervical - Right Side Bend 32   Cervical - Left Side Bend 26   Cervical - Right Rotation 58  pain    Cervical - Left Rotation 57  pain    Thoracic Flexion decreased 25%   Thoracic Extension WNL   Thoracic - Right  Rotation decreased 50%  pain Rt side   Thoracic - Left Rotation decreased 25%  pulling on Rt side   Palpation   Palpation comment tight band of muscle Rt thoracic T5- L2 , Lt side T8-L1; cervical musculature ant/lat/post and upper traps                      [facility] Adult PT Treatment/Exercise - 01/16/16 0001    Therapeutic Activites    Therapeutic Activities --  occipital inhibition w/ 2 golf balls at occiput 1-2 min    Neck Exercises: Supine   Neck Retraction 5 reps;10 secs   Lumbar Exercises: Seated   Other Seated Lumbar Exercises spinal flexion sitting reaching between knees with segmental flexion through cervical and thoracic spine for strentch 20 sec x 3    Shoulder Exercises: Standing   Other Standing Exercises thoracic extension shds at 45 deg abduction 5 sec x 5    Modalities   Modalities Electrical Stimulation;Moist Heat   Moist Heat Therapy   Number Minutes Moist Heat 15 Minutes   Moist Heat Location Cervical  lower thoracic/upper  lumbar   Electrical Stimulation   Electrical Stimulation Location upper/lower thoracic paraspinals    Electrical Stimulation Action IFC   Electrical Stimulation Parameters to tolerance   Electrical Stimulation Goals Pain;Tone   Manual Therapy   Manual therapy comments pt prone    Soft tissue mobilization upper to lower thoracic spine paraspinals; post/lat cervical musculature/upper traps/leveator bilat    Myofascial Release thoracic paraspinals    Scapular Mobilization Rt scapular glides           Trigger Point Dry Needling - 01/16/16 0854    Consent Given? Yes   Education Handout Provided Yes   Muscles Treated Upper Body Rhomboids;Longissimus  bilaterally - Rt tighter than Lt    Rhomboids Response Twitch response elicited;Palpable increased muscle length   Longissimus Response Twitch response elicited;Palpable increased muscle length              PT Education - 01/16/16 0837    Education provided Yes   Education  Details HEP   Person(s) Educated Patient   Methods Explanation;Demonstration;Tactile cues;Verbal cues;Handout   Comprehension Verbalized understanding;Returned demonstration;Verbal cues required;Tactile cues required             PT Long Term Goals - 01/16/16 0853    PT LONG TERM GOAL #1   Title Patient I in HEP for discharge (02/09/16)    Time 4   Period Weeks   Status On-going   PT LONG TERM GOAL #2   Title Trunk and cervical ROM WFL's and painfree ( 02/09/16)    Time 4   Period Weeks   Status On-going   PT LONG TERM GOAL #3   Title report reduction of pain =/> 75% with daily activity 9 02/09/16)    Time 4   Period Weeks   Status On-going   PT LONG TERM GOAL #4   Title Patient to tolerate sitting and standing per his previous status  02/09/16   Time 4   Period Weeks   Status On-going   PT LONG TERM GOAL #5   Title Improve FOTO to </= 29% limitation (02/09/16)    Time 4   Period Weeks   Status On-going               Plan - 01/16/16 [id number]    Clinical Impression Statement Patient presents with complaints of continued thoracic pain and increased cervical pain. Education today on positioning and posture for rest/sleep. Patient tolerated trial of TDN through thoracic spine well. Added exercise without difficulty. No goals accomplished - only 2nd visit.    Rehab Potential Excellent   PT Frequency 2x / week   PT Duration 4 weeks   PT Treatment/Interventions Ultrasound;Scar mobilization;Patient/family education;Dry needling;Electrical Stimulation;Cryotherapy;Moist Heat;Therapeutic exercise;Manual techniques;Taping   PT Next Visit Plan TDN, manual work to back musculature. adderss cervical c/o's    Consulted and Agree with Plan of Care Patient      Patient will benefit from skilled therapeutic intervention in order to improve the following deficits and impairments:  Postural dysfunction, Pain, Increased muscle spasms, Decreased range of motion  Visit Diagnosis: Stiffness due  to immobility  Abnormal posture  Pain in thoracic spine     Problem List Patient Active Problem List   Diagnosis Date Noted  . Thoracic back pain 01/10/2016  . Abrasion 01/10/2016  . Low back pain 08/12/2014  . GAD (generalized anxiety disorder) 09/25/2013  . Insomnia 09/25/2013  . Paresthesia of both hands 09/25/2013  . History of pneumothorax 04/07/2010  . TRANSIENT DISORDER INITIATING/MAINTAINING  SLEEP 12/02/2007  . OTHER THALASSEMIA 09/30/2007  . WEIGHT LOSS, ABNORMAL 09/30/2007  . PAIN IN THORACIC SPINE 02/12/2007    Celyn Rober Minion PT, MPH  01/16/2016, 8:56 AM  [facility] [address] [place], [place], [address] Phone: [phone]   Fax:  [phone]  Name: Raymond [name] MRN: [id number] Date of Birth: Aug 12, 1985

## 2016-01-17 ENCOUNTER — Ambulatory Visit: Payer: Self-pay | Admitting: Family Medicine

## 2016-01-17 ENCOUNTER — Encounter: Payer: Self-pay | Admitting: Family Medicine

## 2016-01-18 ENCOUNTER — Ambulatory Visit (INDEPENDENT_AMBULATORY_CARE_PROVIDER_SITE_OTHER): Payer: BLUE CROSS/BLUE SHIELD | Admitting: Physical Therapy

## 2016-01-18 DIAGNOSIS — M2560 Stiffness of unspecified joint, not elsewhere classified: Secondary | ICD-10-CM

## 2016-01-18 DIAGNOSIS — Z7409 Other reduced mobility: Secondary | ICD-10-CM | POA: Diagnosis not present

## 2016-01-18 DIAGNOSIS — M546 Pain in thoracic spine: Secondary | ICD-10-CM

## 2016-01-18 DIAGNOSIS — M549 Dorsalgia, unspecified: Secondary | ICD-10-CM

## 2016-01-18 DIAGNOSIS — M623 Immobility syndrome (paraplegic): Secondary | ICD-10-CM

## 2016-01-18 DIAGNOSIS — R293 Abnormal posture: Secondary | ICD-10-CM | POA: Diagnosis not present

## 2016-01-18 NOTE — Telephone Encounter (Signed)
Referral placed per PCP request 

## 2016-01-18 NOTE — Therapy (Signed)
[facility] [place] [address] [place], [place], [address] Phone: [phone]   Fax:  [phone]  Physical Therapy Treatment  Patient Details  Name: Raymond [name] MRN: [id number] Date of Birth: 02/15/1986 Referring Provider: Dr. Clementeen Graham  Encounter Date: 01/18/2016      PT End of Session - 01/18/16 0917    Visit Number 3   Number of Visits 8   Date for PT Re-Evaluation 02/09/16   PT Start Time 0850  pt arrived late   PT Stop Time 0946   PT Time Calculation (min) 56 min   Activity Tolerance Patient tolerated treatment well      Past Medical History  Diagnosis Date  . WEIGHT LOSS, ABNORMAL 09/30/2007    Qualifier: Diagnosis of  By: Thomos Lemons    . Spontaneous pneumothorax     Two in the past 2011    Past Surgical History  Procedure Laterality Date  . Chest tubes    . Inguinal hernia repair Left 10/12/13    There were no vitals filed for this visit.      Subjective Assessment - 01/18/16 0918    Subjective Pt reports he was very sore after last session and had headache.     Currently in Pain? Yes   Pain Score 4    Pain Location Thoracic   Pain Orientation Right   Pain Descriptors / Indicators Aching            [facility] PT Assessment - 01/18/16 0001    Assessment   Medical Diagnosis Rt thoracic pain   Referring Provider Dr. Clementeen Graham   Onset Date/Surgical Date 12/31/15   Hand Dominance Right          [facility] Adult PT Treatment/Exercise - 01/18/16 0001    Self-Care   Self-Care Other Self-Care Comments   Other Self-Care Comments  Use of ball and wall for self massage/ MFR to upper back.  Pt returned demo.    Exercises   Exercises Neck;Shoulder   Neck Exercises: Machines for Strengthening   UBE (Upper Arm Bike) L2: 1 min each direction    Neck Exercises: Seated   Neck Retraction 10 reps;3 secs   Other Seated Exercise cervical and thoracic flexion (roll down, roll up) segmentally x 5 reps slowly.     Other Seated Exercise elbows on thighs: cervical flex to/from neutral and diagonals 5 reps each side   Shoulder Exercises: Seated   Row Both;10 reps;Theraband   Theraband Level (Shoulder Row) Level 3 (Green)   Other Seated Exercises wood chop (wthout resistance) x 5 reps each way - slowly. Repeated with 2#    Shoulder Exercises: Stretch   Other Shoulder Stretches Doorway stretch x 3 positions (mid and high level performed unilateral) x 30 sec x 3 reps    Moist Heat Therapy   Number Minutes Moist Heat 15 Minutes   Moist Heat Location Cervical  lower thoracic/upper lumbar   Electrical Stimulation   Electrical Stimulation Location upper/lower thoracic paraspinals    Electrical Stimulation Action IFC   Electrical Stimulation Parameters to tolerance    Electrical Stimulation Goals Pain;Tone   Manual Therapy   Manual Therapy Soft tissue mobilization;Myofascial release;Taping   Manual therapy comments pt prone    Soft tissue mobilization upper to lower thoracic spine paraspinals; post/lat cervical musculature/upper traps/leveator bilat    Myofascial Release thoracic paraspinals    Kinesiotex --  Rock tape to Rt thoracic muscles to dec pain,inc awareness  PT Long Term Goals - 01/16/16 [id number]    PT LONG TERM GOAL #1   Title Patient I in HEP for discharge (02/09/16)    Time 4   Period Weeks   Status On-going   PT LONG TERM GOAL #2   Title Trunk and cervical ROM WFL's and painfree ( 02/09/16)    Time 4   Period Weeks   Status On-going   PT LONG TERM GOAL #3   Title report reduction of pain =/> 75% with daily activity 9 02/09/16)    Time 4   Period Weeks   Status On-going   PT LONG TERM GOAL #4   Title Patient to tolerate sitting and standing per his previous status  02/09/16   Time 4   Period Weeks   Status On-going   PT LONG TERM GOAL #5   Title Improve FOTO to </= 29% limitation (02/09/16)    Time 4   Period Weeks   Status On-going                Plan - 01/18/16 0932    Clinical Impression Statement Pt reported decreased pain in back by 2 points with ther ex and further reduction with use of estim/MHP as well as manual therapy.  Progressing towards goals.     Rehab Potential Excellent   PT Frequency 2x / week   PT Duration 4 weeks   PT Treatment/Interventions Ultrasound;Scar mobilization;Patient/family education;Dry needling;Electrical Stimulation;Cryotherapy;Moist Heat;Therapeutic exercise;Manual techniques;Taping   PT Next Visit Plan TDN, manual work to back musculature. Assess response to tape and new exercises.     Consulted and Agree with Plan of Care Patient      Patient will benefit from skilled therapeutic intervention in order to improve the following deficits and impairments:  Postural dysfunction, Pain, Increased muscle spasms, Decreased range of motion  Visit Diagnosis: Stiffness due to immobility  Abnormal posture  Pain in thoracic spine  Impaired functional mobility and endurance  Mid back pain     Problem List Patient Active Problem List   Diagnosis Date Noted  . Toe fracture 01/16/2016  . Thoracic back pain 01/10/2016  . Abrasion 01/10/2016  . Low back pain 08/12/2014  . GAD (generalized anxiety disorder) 09/25/2013  . Insomnia 09/25/2013  . Paresthesia of both hands 09/25/2013  . History of pneumothorax 04/07/2010  . TRANSIENT DISORDER INITIATING/MAINTAINING SLEEP 12/02/2007  . OTHER THALASSEMIA 09/30/2007  . WEIGHT LOSS, ABNORMAL 09/30/2007  . PAIN IN THORACIC SPINE 02/12/2007   Mayer CamelJennifer Carlson-Long, PTA 01/18/2016 12:56 PM  [facility] [place] [address] [place], [place], [address] Phone: [phone]   Fax:  [phone]  Name: Raymond [name] MRN: [id number] Date of Birth: 01-22-1986

## 2016-01-23 ENCOUNTER — Ambulatory Visit (INDEPENDENT_AMBULATORY_CARE_PROVIDER_SITE_OTHER): Payer: BLUE CROSS/BLUE SHIELD | Admitting: Physical Therapy

## 2016-01-23 DIAGNOSIS — M2560 Stiffness of unspecified joint, not elsewhere classified: Secondary | ICD-10-CM

## 2016-01-23 DIAGNOSIS — M623 Immobility syndrome (paraplegic): Secondary | ICD-10-CM | POA: Diagnosis not present

## 2016-01-23 DIAGNOSIS — Z7409 Other reduced mobility: Secondary | ICD-10-CM | POA: Diagnosis not present

## 2016-01-23 DIAGNOSIS — M546 Pain in thoracic spine: Secondary | ICD-10-CM

## 2016-01-23 DIAGNOSIS — M549 Dorsalgia, unspecified: Secondary | ICD-10-CM

## 2016-01-23 DIAGNOSIS — R293 Abnormal posture: Secondary | ICD-10-CM | POA: Diagnosis not present

## 2016-01-23 NOTE — Therapy (Signed)
[facility] [place] [address] [place], [place], [address] Phone: [phone]   Fax:  [phone]  Physical Therapy Treatment  Patient Details  Name: Raymond [name] MRN: [id number] Date of Birth: 21-Dec-1985 Referring Provider: Dr. Denyse Amass   Encounter Date: 01/23/2016      PT End of Session - 01/23/16 0856    Visit Number 4   Number of Visits 8   Date for PT Re-Evaluation 02/09/16   PT Start Time 0850   PT Stop Time 0947   PT Time Calculation (min) 57 min      Past Medical History  Diagnosis Date  . WEIGHT LOSS, ABNORMAL 09/30/2007    Qualifier: Diagnosis of  By: Thomos Lemons    . Spontaneous pneumothorax     Two in the past 2011    Past Surgical History  Procedure Laterality Date  . Chest tubes    . Inguinal hernia repair Left 10/12/13    There were no vitals filed for this visit.      Subjective Assessment - 01/23/16 0913    Subjective Pt reports he woke with horrible headache.  He had relief after last session, into next day..but pain crept back up again. Not sure if tape helped.    Patient Stated Goals not have the back pain or greatly reduce it.    Currently in Pain? Yes   Pain Score 8    Pain Location Head   Pain Orientation Posterior   Aggravating Factors  ?   Pain Relieving Factors pain medication, heat    Multiple Pain Sites Yes   Pain Score 4   Pain Location Back   Pain Orientation Right   Pain Descriptors / Indicators Aching   Aggravating Factors  sitting upright    Pain Relieving Factors pain med, heat, stretch             [facility] PT Assessment - 01/23/16 0001    Assessment   Medical Diagnosis Rt thoracic pain   Referring Provider Dr. Denyse Amass    Onset Date/Surgical Date 12/31/15   Hand Dominance Right   Next MD Visit this week.          [facility] Adult PT Treatment/Exercise - 01/23/16 0001    Neck Exercises: Machines for Strengthening   UBE (Upper Arm Bike) L4: 4 min alterating directions  each minute.    Neck Exercises: Supine   Other Supine Exercise Hooklying thoracic ext over black bolster x 4 reps (green bolster too rigid)    Shoulder Exercises: Seated   Row Both;10 reps;Theraband   Theraband Level (Shoulder Row) Level 3 (Green)   Other Seated Exercises wood chop (wthout resistance) x 5 reps each way.   Segmental spine flexion to hands on floor, then rolling back up x 4 reps    Shoulder Exercises: Sidelying   Other Sidelying Exercises Thoracic rotation with shoulder horiz abduction without resistance x 2, red x 2 reps, moved up to green band x 10 reps - each side.    Shoulder Exercises: Stretch   Other Shoulder Stretches Doorway stretch x 3 positions (mid and high level performed unilateral) x 30 sec x 3 reps    Moist Heat Therapy   Number Minutes Moist Heat 15 Minutes   Moist Heat Location Cervical  lower thoracic/upper lumbar   Electrical Stimulation   Electrical Stimulation Location upper/lower thoracic paraspinals    Electrical Stimulation Action IFC   Electrical Stimulation Parameters to tolerance    Electrical Stimulation  Goals Pain;Tone   Manual Therapy   Myofascial Release suboccipital release, Rt scalenes, upper trap, levator.            PT Education - 01/23/16 0935    Education provided Yes   Education Details HEP - added rowing and sidelying rotation with band to HEP.    Person(s) Educated Patient   Methods Explanation;Handout   Comprehension Verbalized understanding             PT Long Term Goals - 01/16/16 0853    PT LONG TERM GOAL #1   Title Patient I in HEP for discharge (02/09/16)    Time 4   Period Weeks   Status On-going   PT LONG TERM GOAL #2   Title Trunk and cervical ROM WFL's and painfree ( 02/09/16)    Time 4   Period Weeks   Status On-going   PT LONG TERM GOAL #3   Title report reduction of pain =/> 75% with daily activity 9 02/09/16)    Time 4   Period Weeks   Status On-going   PT LONG TERM GOAL #4   Title Patient to  tolerate sitting and standing per his previous status  02/09/16   Time 4   Period Weeks   Status On-going   PT LONG TERM GOAL #5   Title Improve FOTO to </= 29% limitation (02/09/16)    Time 4   Period Weeks   Status On-going               Plan - 01/23/16 1333    Clinical Impression Statement Pt presented with headache in addition to back pain this visit.  Some exercises were tolerated in mid-back, but aggrivated neck.  Pain was slightly reduced with manual therapy and further reduction with estim/MHP.  Making gradual gains towards goals.    Rehab Potential Excellent   PT Frequency 2x / week   PT Duration 4 weeks   PT Treatment/Interventions Ultrasound;Scar mobilization;Patient/family education;Dry needling;Electrical Stimulation;Cryotherapy;Moist Heat;Therapeutic exercise;Manual techniques;Taping   PT Next Visit Plan TDN/ manual work to back musculature.    Consulted and Agree with Plan of Care Patient      Patient will benefit from skilled therapeutic intervention in order to improve the following deficits and impairments:  Postural dysfunction, Pain, Increased muscle spasms, Decreased range of motion  Visit Diagnosis: Stiffness due to immobility  Abnormal posture  Pain in thoracic spine  Impaired functional mobility and endurance  Mid back pain     Problem List Patient Active Problem List   Diagnosis Date Noted  . Toe fracture 01/16/2016  . Thoracic back pain 01/10/2016  . Abrasion 01/10/2016  . Low back pain 08/12/2014  . GAD (generalized anxiety disorder) 09/25/2013  . Insomnia 09/25/2013  . Paresthesia of both hands 09/25/2013  . History of pneumothorax 04/07/2010  . TRANSIENT DISORDER INITIATING/MAINTAINING SLEEP 12/02/2007  . OTHER THALASSEMIA 09/30/2007  . WEIGHT LOSS, ABNORMAL 09/30/2007  . PAIN IN THORACIC SPINE 02/12/2007   Mayer CamelJennifer Carlson-Long, PTA 01/23/2016 1:36 PM  [facility] [place] [address] [place], [place], [address] Phone: [phone]   Fax:  [phone]  Name: Marko PlumeJason L Sees MRN: [id number] Date of Birth: 05-17-1986

## 2016-01-25 ENCOUNTER — Ambulatory Visit (INDEPENDENT_AMBULATORY_CARE_PROVIDER_SITE_OTHER): Payer: BLUE CROSS/BLUE SHIELD | Admitting: Rehabilitative and Restorative Service Providers"

## 2016-01-25 ENCOUNTER — Encounter: Payer: Self-pay | Admitting: Rehabilitative and Restorative Service Providers"

## 2016-01-25 DIAGNOSIS — R293 Abnormal posture: Secondary | ICD-10-CM

## 2016-01-25 DIAGNOSIS — M546 Pain in thoracic spine: Secondary | ICD-10-CM | POA: Diagnosis not present

## 2016-01-25 DIAGNOSIS — M623 Immobility syndrome (paraplegic): Secondary | ICD-10-CM

## 2016-01-25 DIAGNOSIS — M2560 Stiffness of unspecified joint, not elsewhere classified: Secondary | ICD-10-CM

## 2016-01-25 DIAGNOSIS — Z7409 Other reduced mobility: Secondary | ICD-10-CM

## 2016-01-25 NOTE — Therapy (Signed)
[facility] [place] [address] [place], [place], [address] Phone: [phone]   Fax:  [phone]  Physical Therapy Treatment  Patient Details  Name: Raymond [name] MRN: [id number] Date of Birth: 28-Jul-1986 Referring Provider: Dr. Denyse Amass   Encounter Date: 01/25/2016      PT End of Session - 01/25/16 0758    Visit Number 5   Number of Visits 8   Date for PT Re-Evaluation 02/09/16   PT Start Time 0758   PT Stop Time 0854   PT Time Calculation (min) 56 min   Activity Tolerance Patient tolerated treatment well      Past Medical History  Diagnosis Date  . WEIGHT LOSS, ABNORMAL 09/30/2007    Qualifier: Diagnosis of  By: Thomos Lemons    . Spontaneous pneumothorax     Two in the past 2011    Past Surgical History  Procedure Laterality Date  . Chest tubes    . Inguinal hernia repair Left 10/12/13    There were no vitals filed for this visit.      Subjective Assessment - 01/25/16 0759    Subjective Patient reports that he has less pain and headache today. Awoke with his head off the pillow all together. Feeling better overall. Starts a new job Monday. 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    Currently in Pain? Yes   Pain Score 4    Pain Location Back   Pain Orientation Mid;Upper;Right   Pain Descriptors / Indicators Aching   Pain Type Acute pain   Pain Onset 1 to 4 weeks ago   Pain Frequency Constant  constant lower level pain    Aggravating Factors  moving in certain ways   Pain Relieving Factors heat; meds;    Pain Score 0   Pain Location Neck   Pain Orientation Right   Pain Descriptors / Indicators Sore   Aggravating Factors  sleeping postions    Pain Relieving Factors heat; meds; stretching                          [facility] Adult PT Treatment/Exercise - 01/25/16 0001    Neck Exercises: Machines for Strengthening   UBE (Upper Arm Bike) L4: 4 min alterating directions each minute.  Standing    Neck Exercises: Supine   Neck Retraction 5 reps;10 secs   Shoulder Exercises: Standing   Extension Strengthening;Both;20 reps;Theraband   Theraband Level (Shoulder Extension) Level 3 (Green)   Row Strengthening;Both;20 reps;Theraband   Retraction Strengthening;Both;20 reps;Theraband  with noodle    Theraband Level (Shoulder Retraction) Level 1 (Yellow)   Other Standing Exercises scap squeeze with noodle 10 sec x 10    Shoulder Exercises: Stretch   Other Shoulder Stretches Doorway stretch x 3 positions (mid and high level performed unilateral) x 30 sec x 3 reps  - added lower level    Moist Heat Therapy   Number Minutes Moist Heat 15 Minutes   Moist Heat Location Cervical  lower thoracic/upper lumbar   Electrical Stimulation   Electrical Stimulation Location upper/lower thoracic paraspinals to Rt medial scapula   Electrical Stimulation Action IFC   Electrical Stimulation Parameters to tolerance    Electrical Stimulation Goals Pain;Tone   Manual Therapy   Manual Therapy Soft tissue mobilization;Myofascial release;Taping   Manual therapy comments pt prone    Soft tissue mobilization upper to lower thoracic spine paraspinals; post/lat cervical musculature/upper traps/leveator bilat  Scapular Mobilization Rt scapular glides           Trigger Point Dry Needling - 01/25/16 0842    Consent Given? Yes   Muscles Treated Upper Body Rhomboids;Longissimus   Rhomboids Response Twitch response elicited;Palpable increased muscle length   Longissimus Response Twitch response elicited;Palpable increased muscle length                   PT Long Term Goals - 01/25/16 0808    PT LONG TERM GOAL #1   Title Patient I in HEP for discharge (02/09/16)    Time 4   Period Weeks   Status On-going   PT LONG TERM GOAL #2   Title Trunk and cervical ROM WFL's and painfree ( 02/09/16)    Time 4   Period Weeks   Status On-going   PT LONG TERM GOAL #3   Title report reduction of  pain =/> 75% with daily activity 9 02/09/16)    Time 4   Period Weeks   Status On-going   PT LONG TERM GOAL #4   Title Patient to tolerate sitting and standing per his previous status  02/09/16   Time 4   Period Weeks   Status On-going   PT LONG TERM GOAL #5   Title Improve FOTO to </= 29% limitation (02/09/16)    Time 4   Period Weeks   Status On-going               Plan - 01/25/16 [id number]    Clinical Impression Statement Improvement in headache and neck pain. Continues to have mid back pain through the Rt mid back area. He is feeling better overall. Progressing well toward stated goals of therapy. Good response to TDN    Rehab Potential Good   PT Frequency 2x / week   PT Duration 4 weeks   PT Treatment/Interventions Ultrasound;Scar mobilization;Patient/family education;Dry needling;Electrical Stimulation;Cryotherapy;Moist Heat;Therapeutic exercise;Manual techniques;Taping   PT Next Visit Plan TDN/ manual work to back musculature as indicated; progress with stabilization and strengthening    Consulted and Agree with Plan of Care Patient      Patient will benefit from skilled therapeutic intervention in order to improve the following deficits and impairments:  Postural dysfunction, Pain, Increased muscle spasms, Decreased range of motion  Visit Diagnosis: Stiffness due to immobility  Abnormal posture  Pain in thoracic spine     Problem List Patient Active Problem List   Diagnosis Date Noted  . Toe fracture 01/16/2016  . Thoracic back pain 01/10/2016  . Abrasion 01/10/2016  . Low back pain 08/12/2014  . GAD (generalized anxiety disorder) 09/25/2013  . Insomnia 09/25/2013  . Paresthesia of both hands 09/25/2013  . History of pneumothorax 04/07/2010  . TRANSIENT DISORDER INITIATING/MAINTAINING SLEEP 12/02/2007  . OTHER THALASSEMIA 09/30/2007  . WEIGHT LOSS, ABNORMAL 09/30/2007  . PAIN IN THORACIC SPINE 02/12/2007    Celyn Rober MinionP Holt PT, MPH  01/25/2016, 8:44 AM  [facility] [address] [place], [place], [address] Phone: [phone]   Fax:  [phone]  Name: Raymond [name] MRN: [id number] Date of Birth: Dec 30, 1985

## 2016-01-26 ENCOUNTER — Ambulatory Visit (INDEPENDENT_AMBULATORY_CARE_PROVIDER_SITE_OTHER): Payer: BLUE CROSS/BLUE SHIELD

## 2016-01-26 ENCOUNTER — Ambulatory Visit (INDEPENDENT_AMBULATORY_CARE_PROVIDER_SITE_OTHER): Payer: BLUE CROSS/BLUE SHIELD | Admitting: Family Medicine

## 2016-01-26 VITALS — BP 117/76 | HR 88 | Wt 146.0 lb

## 2016-01-26 DIAGNOSIS — S92911A Unspecified fracture of right toe(s), initial encounter for closed fracture: Secondary | ICD-10-CM | POA: Diagnosis not present

## 2016-01-26 DIAGNOSIS — S92511D Displaced fracture of proximal phalanx of right lesser toe(s), subsequent encounter for fracture with routine healing: Secondary | ICD-10-CM | POA: Diagnosis not present

## 2016-01-26 DIAGNOSIS — M25561 Pain in right knee: Secondary | ICD-10-CM

## 2016-01-26 MED ORDER — HYDROCODONE-ACETAMINOPHEN 5-325 MG PO TABS: 1.0000 | ORAL_TABLET | Freq: Every evening | ORAL | Status: DC | PRN

## 2016-01-26 NOTE — Progress Notes (Signed)
Quick Note:  X-ray of the knee is normal ______

## 2016-01-26 NOTE — Patient Instructions (Signed)
Thank you for coming in today. Return in 1 month.  Use norco sparingly.
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
## 2016-01-30 ENCOUNTER — Ambulatory Visit: Payer: Self-pay | Admitting: Family Medicine

## 2016-02-13 ENCOUNTER — Ambulatory Visit (INDEPENDENT_AMBULATORY_CARE_PROVIDER_SITE_OTHER): Payer: BLUE CROSS/BLUE SHIELD | Admitting: Psychiatry

## 2016-02-13 ENCOUNTER — Encounter (HOSPITAL_COMMUNITY): Payer: Self-pay | Admitting: Psychiatry

## 2016-02-13 ENCOUNTER — Ambulatory Visit (INDEPENDENT_AMBULATORY_CARE_PROVIDER_SITE_OTHER): Payer: BLUE CROSS/BLUE SHIELD | Admitting: Physical Therapy

## 2016-02-13 VITALS — BP 120/70 | HR 107 | Ht 68.0 in | Wt 146.0 lb

## 2016-02-13 DIAGNOSIS — R293 Abnormal posture: Secondary | ICD-10-CM | POA: Diagnosis not present

## 2016-02-13 DIAGNOSIS — F411 Generalized anxiety disorder: Secondary | ICD-10-CM

## 2016-02-13 DIAGNOSIS — M546 Pain in thoracic spine: Secondary | ICD-10-CM | POA: Diagnosis not present

## 2016-02-13 DIAGNOSIS — G4700 Insomnia, unspecified: Secondary | ICD-10-CM

## 2016-02-13 DIAGNOSIS — M623 Immobility syndrome (paraplegic): Secondary | ICD-10-CM | POA: Diagnosis not present

## 2016-02-13 DIAGNOSIS — Z7409 Other reduced mobility: Secondary | ICD-10-CM

## 2016-02-13 DIAGNOSIS — F4311 Post-traumatic stress disorder, acute: Secondary | ICD-10-CM

## 2016-02-13 DIAGNOSIS — M549 Dorsalgia, unspecified: Secondary | ICD-10-CM

## 2016-02-13 DIAGNOSIS — M2560 Stiffness of unspecified joint, not elsewhere classified: Secondary | ICD-10-CM

## 2016-02-13 MED ORDER — SERTRALINE HCL 100 MG PO TABS: 100.0000 mg | ORAL_TABLET | Freq: Every day | ORAL | Status: DC
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
## 2016-02-16 ENCOUNTER — Ambulatory Visit (INDEPENDENT_AMBULATORY_CARE_PROVIDER_SITE_OTHER): Payer: BLUE CROSS/BLUE SHIELD | Admitting: Physical Therapy

## 2016-02-16 DIAGNOSIS — R293 Abnormal posture: Secondary | ICD-10-CM

## 2016-02-16 DIAGNOSIS — M2560 Stiffness of unspecified joint, not elsewhere classified: Secondary | ICD-10-CM

## 2016-02-16 DIAGNOSIS — Z7409 Other reduced mobility: Secondary | ICD-10-CM

## 2016-02-16 DIAGNOSIS — M546 Pain in thoracic spine: Secondary | ICD-10-CM

## 2016-02-16 DIAGNOSIS — M623 Immobility syndrome (paraplegic): Secondary | ICD-10-CM | POA: Diagnosis not present

## 2016-02-16 NOTE — Therapy (Signed)
[facility] [address] [place], [place], [address] Phone: [phone]   Fax:  [phone]  Physical Therapy Treatment  Patient Details  Name: Raymond PlumeJason L [name] MRN: [id number] Date of Birth: October 28, 1985 Referring Provider: Dr. Denyse Amassorey  Encounter Date: 02/16/2016      PT End of Session - 02/16/16 0857    Visit Number 7   Number of Visits 20   Date for PT Re-Evaluation 03/26/16   PT Start Time 0856  pt in late   PT Stop Time 0927   PT Time Calculation (min) 31 min      Past Medical History  Diagnosis Date  . WEIGHT LOSS, ABNORMAL 09/30/2007    Qualifier: Diagnosis of  By: Thomos LemonsBowen DO, Karen    . Spontaneous pneumothorax     Two in the past 2011    Past Surgical History  Procedure Laterality Date  . Chest tubes    . Inguinal hernia repair Left 10/12/13    There were no vitals filed for this visit.      Subjective Assessment - 02/16/16 0857    Subjective Barbara CowerJason feels a little better today, may try and go back to the gym this weekend.    Currently in Pain? Yes   Pain Score 4    Pain Location Thoracic   Pain Orientation Right   Pain Descriptors / Indicators Burning   Pain Type Acute pain   Pain Onset More than a month ago   Pain Frequency Constant   Aggravating Factors  sitting, certain positions   Pain Relieving Factors moving around                         [facility] - 02/16/16 0001    Exercises   Exercises Shoulder   Shoulder Exercises: Supine   External Rotation Strengthening;Both;Theraband  3x10   Theraband Level (Shoulder External Rotation) Level 3 (Green)   Other Supine Exercises lying on pool noodle,length of spine, the cross   Shoulder Exercises: Prone   Other Prone Exercises upper body lifts with arms in T, then scare crow, 2x10 each   Shoulder Exercises: Sidelying   Other Sidelying Exercises Thoracic rotation with shoulder horiz abduction green band 3 x 10  reps - each side.    Shoulder Exercises: ROM/Strengthening   UBE (Upper Arm Bike) L3x4' alt FWD/BWD   Modalities   Modalities --   Moist Heat Therapy   Number Minutes Moist Heat --  not needed today   Moist Heat Location --   Electrical Stimulation   Electrical Stimulation Location --  not needed today   Electrical Stimulation Action --   Electrical Stimulation Parameters --   Electrical Stimulation Goals --                     PT Long Term Goals - 02/13/16 1128    PT LONG TERM GOAL #1   Title Patient I in HEP for discharge (03/26/16)    Time 10  added 6 wk to POC 02/13/16    Period Weeks   Status Revised   PT LONG TERM GOAL #2   Title Trunk and cervical ROM WFL's and painfree ( 03/2116)    Time 10   Period Weeks   Status Revised   PT LONG TERM GOAL #3   Title report reduction of pain =/> 75% with daily activity (03/26/16)    Time 10   Period Weeks  Status Revised   PT LONG TERM GOAL #4   Title Patient to tolerate sitting and standing per his previous status  03/26/16   Time 10   Period Weeks   Status On-going   PT LONG TERM GOAL #5   Title Improve FOTO to </= 29% limitation (03/26/16)    Time 10   Period Weeks   Status Revised               Plan - 02/16/16 0859    Clinical Impression Statement Leonte responded very well to treatment, pain down to 1/10 after ther ex.  Issued blue band to use at home. No need for stim and heat today.    Rehab Potential Good   PT Frequency 2x / week   PT Duration 4 weeks   PT Treatment/Interventions Ultrasound;Scar mobilization;Patient/family education;Dry needling;Electrical Stimulation;Cryotherapy;Moist Heat;Therapeutic exercise;Manual techniques;Taping   Consulted and Agree with Plan of Care Patient      Patient will benefit from skilled therapeutic intervention in order to improve the following deficits and impairments:  Postural dysfunction, Pain, Increased muscle spasms, Decreased range of motion  Visit  Diagnosis: Stiffness due to immobility  Abnormal posture  Pain in thoracic spine  Impaired functional mobility and endurance     Problem List Patient Active Problem List   Diagnosis Date Noted  . Right knee pain 01/26/2016  . Toe fracture 01/16/2016  . Thoracic back pain 01/10/2016  . Abrasion 01/10/2016  . Low back pain 08/12/2014  . GAD (generalized anxiety disorder) 09/25/2013  . Insomnia 09/25/2013  . Paresthesia of both hands 09/25/2013  . History of pneumothorax 04/07/2010  . TRANSIENT DISORDER INITIATING/MAINTAINING SLEEP 12/02/2007  . OTHER THALASSEMIA 09/30/2007  . WEIGHT LOSS, ABNORMAL 09/30/2007  . PAIN IN THORACIC SPINE 02/12/2007    Roderic Scarce PT  02/16/2016, 9:30 AM  [facility] [address] [place], [place], [address] Phone: [phone]   Fax:  [phone]  Name: Raymond [name] MRN: [id number] Date of Birth: 08-04-1986

## 2016-02-21 ENCOUNTER — Ambulatory Visit (INDEPENDENT_AMBULATORY_CARE_PROVIDER_SITE_OTHER): Payer: BLUE CROSS/BLUE SHIELD | Admitting: Rehabilitative and Restorative Service Providers"

## 2016-02-21 ENCOUNTER — Encounter: Payer: Self-pay | Admitting: Rehabilitative and Restorative Service Providers"

## 2016-02-21 DIAGNOSIS — M623 Immobility syndrome (paraplegic): Secondary | ICD-10-CM | POA: Diagnosis not present

## 2016-02-21 DIAGNOSIS — R293 Abnormal posture: Secondary | ICD-10-CM | POA: Diagnosis not present

## 2016-02-21 DIAGNOSIS — M2560 Stiffness of unspecified joint, not elsewhere classified: Secondary | ICD-10-CM

## 2016-02-21 DIAGNOSIS — M546 Pain in thoracic spine: Secondary | ICD-10-CM | POA: Diagnosis not present

## 2016-02-21 DIAGNOSIS — Z7409 Other reduced mobility: Secondary | ICD-10-CM

## 2016-02-21 NOTE — Therapy (Signed)
[facility] [address] [place], [place], [address] Phone: [phone]   Fax:  [phone]  Physical Therapy Treatment  Patient Details  Name: Raymond PlumeJason L [name] MRN: [id number] Date of Birth: February 01, 1986 Referring Provider: Dr. Denyse Amassorey  Encounter Date: 02/21/2016      PT End of Session - 02/21/16 0851    Visit Number 8   Number of Visits 20   Date for PT Re-Evaluation 03/26/16   PT Start Time 0849  patient 7 min late   PT Stop Time 0932   PT Time Calculation (min) 43 min   Activity Tolerance Patient tolerated treatment well      Past Medical History  Diagnosis Date  . WEIGHT LOSS, ABNORMAL 09/30/2007    Qualifier: Diagnosis of  By: Thomos LemonsBowen DO, Karen    . Spontaneous pneumothorax     Two in the past 2011    Past Surgical History  Procedure Laterality Date  . Chest tubes    . Inguinal hernia repair Left 10/12/13    There were no vitals filed for this visit.      Subjective Assessment - 02/21/16 0853    Subjective Increased pain over the weekend - not sure why - he was on the move all weekend and did not have time to do his exercises. Had paiin yesterday and did not feel like stretching    Currently in Pain? Yes   Pain Score 4    Pain Location Thoracic   Pain Orientation Right   Pain Descriptors / Indicators Sharp   Pain Type Acute pain   Pain Onset More than a month ago   Pain Frequency Constant                         [facility] Adult PT Treatment/Exercise - 02/21/16 0001    Neck Exercises: Machines for Strengthening   UBE (Upper Arm Bike) L4: 4 min alterating directions each minute. Standing    Shoulder Exercises: Stretch   Other Shoulder Stretches Doorway stretch x 3 positions x 30 sec x 3 reps     Moist Heat Therapy   Number Minutes Moist Heat 15 Minutes   Moist Heat Location Cervical  thoracic spine    Electrical Stimulation   Electrical Stimulation Location Rt cervical to thoracic spine     Electrical Stimulation Action IFC   Electrical Stimulation Parameters to tolerance   Electrical Stimulation Goals Pain;Tone   Manual Therapy   Manual Therapy Soft tissue mobilization   Manual therapy comments pt prone    Soft tissue mobilization cervical to upper to lower thoracic spine paraspinals of Lt and Rt.     Myofascial Release posterior cervical to thoracic area           Trigger Point Dry Needling - 02/21/16 0925    Consent Given? Yes   Upper Trapezius Response Twitch reponse elicited;Palpable increased muscle length  bilat   Levator Scapulae Response Twitch response elicited;Palpable increased muscle length  Rt   Rhomboids Response Twitch response elicited;Palpable increased muscle length   Supraspinatus Response Twitch response elicited;Palpable increased muscle length  Rt   Infraspinatus Response Twitch response elicited;Palpable increased muscle length  Rt   Longissimus Response Twitch response elicited;Palpable increased muscle length  bilat                    PT Long Term Goals - 02/21/16 0855    PT LONG TERM GOAL #1  Title Patient I in HEP for discharge (03/26/16)    Time 10   Period Weeks   Status On-going   PT LONG TERM GOAL #2   Title Trunk and cervical ROM WFL's and painfree ( 03/2116)    Time 10   Period Weeks   Status On-going   PT LONG TERM GOAL #3   Title report reduction of pain =/> 75% with daily activity (03/26/16)    Time 10   Period Weeks   Status On-going   PT LONG TERM GOAL #4   Title Patient to tolerate sitting and standing per his previous status  03/26/16   Time 10   Period Weeks   Status On-going   PT LONG TERM GOAL #5   Title Improve FOTO to </= 29% limitation (03/26/16)    Time 10   Period Weeks   Status On-going               Plan - 02/21/16 0856    Clinical Impression Statement Flare up of symptoms over the weekend - inconsistent with exercises. Up and down course of therapy. Gradual progress toward  stated goals of therapy. Good response with TDN    Rehab Potential Good   PT Frequency 2x / week   PT Duration 4 weeks   PT Treatment/Interventions Ultrasound;Scar mobilization;Patient/family education;Dry needling;Electrical Stimulation;Cryotherapy;Moist Heat;Therapeutic exercise;Manual techniques;Taping   PT Next Visit Plan TDN/ spinal mobs manual work.  postural strenthening;    Consulted and Agree with Plan of Care Patient      Patient will benefit from skilled therapeutic intervention in order to improve the following deficits and impairments:  Postural dysfunction, Pain, Increased muscle spasms, Decreased range of motion  Visit Diagnosis: Stiffness due to immobility  Abnormal posture  Pain in thoracic spine     Problem List Patient Active Problem List   Diagnosis Date Noted  . Right knee pain 01/26/2016  . Toe fracture 01/16/2016  . Thoracic back pain 01/10/2016  . Abrasion 01/10/2016  . Low back pain 08/12/2014  . GAD (generalized anxiety disorder) 09/25/2013  . Insomnia 09/25/2013  . Paresthesia of both hands 09/25/2013  . History of pneumothorax 04/07/2010  . TRANSIENT DISORDER INITIATING/MAINTAINING SLEEP 12/02/2007  . OTHER THALASSEMIA 09/30/2007  . WEIGHT LOSS, ABNORMAL 09/30/2007  . PAIN IN THORACIC SPINE 02/12/2007    Lundynn Cohoon Rober Minion PT, MPH  02/21/2016, 9:34 AM  [facility] [address] [place], [place], [address] Phone: [phone]   Fax:  [phone]  Name: Raymond [name] MRN: [id number] Date of Birth: Nov 18, 1985

## 2016-02-24 ENCOUNTER — Ambulatory Visit (INDEPENDENT_AMBULATORY_CARE_PROVIDER_SITE_OTHER): Payer: BLUE CROSS/BLUE SHIELD | Admitting: Family Medicine

## 2016-02-24 ENCOUNTER — Encounter: Payer: Self-pay | Admitting: Physical Therapy

## 2016-02-24 ENCOUNTER — Ambulatory Visit (INDEPENDENT_AMBULATORY_CARE_PROVIDER_SITE_OTHER): Payer: BLUE CROSS/BLUE SHIELD | Admitting: Physical Therapy

## 2016-02-24 ENCOUNTER — Encounter: Payer: Self-pay | Admitting: Family Medicine

## 2016-02-24 ENCOUNTER — Ambulatory Visit (INDEPENDENT_AMBULATORY_CARE_PROVIDER_SITE_OTHER): Payer: BLUE CROSS/BLUE SHIELD

## 2016-02-24 VITALS — BP 132/87 | HR 78 | Wt 151.0 lb

## 2016-02-24 DIAGNOSIS — M623 Immobility syndrome (paraplegic): Secondary | ICD-10-CM

## 2016-02-24 DIAGNOSIS — M546 Pain in thoracic spine: Secondary | ICD-10-CM

## 2016-02-24 DIAGNOSIS — Z7409 Other reduced mobility: Secondary | ICD-10-CM | POA: Diagnosis not present

## 2016-02-24 DIAGNOSIS — X58XXXA Exposure to other specified factors, initial encounter: Secondary | ICD-10-CM | POA: Diagnosis not present

## 2016-02-24 DIAGNOSIS — R293 Abnormal posture: Secondary | ICD-10-CM | POA: Diagnosis not present

## 2016-02-24 DIAGNOSIS — M2560 Stiffness of unspecified joint, not elsewhere classified: Secondary | ICD-10-CM

## 2016-02-24 DIAGNOSIS — M6588 Other synovitis and tenosynovitis, other site: Secondary | ICD-10-CM | POA: Diagnosis not present

## 2016-02-24 DIAGNOSIS — S92911A Unspecified fracture of right toe(s), initial encounter for closed fracture: Secondary | ICD-10-CM

## 2016-02-24 DIAGNOSIS — M659 Synovitis and tenosynovitis, unspecified: Secondary | ICD-10-CM

## 2016-02-24 MED ORDER — DICLOFENAC SODIUM 1 % TD GEL: 2.0000 g | Freq: Four times a day (QID) | TRANSDERMAL | Status: DC

## 2016-02-24 NOTE — Therapy (Signed)
[facility] [address] [place], [place], [address] Phone: [phone]   Fax:  [phone]  Physical Therapy Treatment  Patient Details  Name: Raymond [name] MRN: [id number] Date of Birth: 24-Apr-1986 Referring Provider: Dr. Denyse Amassorey  Encounter Date: 02/24/2016      PT End of Session - 02/24/16 0857    Visit Number 9   Number of Visits 20   Date for PT Re-Evaluation 03/26/16   PT Start Time 0845   PT Stop Time 0941   PT Time Calculation (min) 56 min   Activity Tolerance Patient tolerated treatment well      Past Medical History  Diagnosis Date  . WEIGHT LOSS, ABNORMAL 09/30/2007    Qualifier: Diagnosis of  By: Thomos LemonsBowen DO, Karen    . Spontaneous pneumothorax     Two in the past 2011    Past Surgical History  Procedure Laterality Date  . Chest tubes    . Inguinal hernia repair Left 10/12/13    There were no vitals filed for this visit.      Subjective Assessment - 02/24/16 0850    Subjective Pt reports his pain is down a little from the other day, still sore and would like to hold off on TDN today    Currently in Pain? Yes   Pain Score 2   with certain positions up to 6/10   Pain Orientation Right   Pain Descriptors / Indicators Aching;Clance BollSharp                         [facility] Adult PT Treatment/Exercise - 02/24/16 0001    Neck Exercises: Machines for Strengthening   UBE (Upper Arm Bike) L3 x 4' BWD only   Shoulder Exercises: Supine   Horizontal ABduction Strengthening;Both;15 reps;Theraband  on bolster   Theraband Level (Shoulder Horizontal ABduction) Level 2 (Red)   External Rotation Strengthening;Both;15 reps;Theraband  on bolster   Flexion Strengthening;Both;15 reps;Theraband  overhead, on bolster   Theraband Level (Shoulder Flexion) Level 2 (Red)   Other Supine Exercises SASH on bolster, red band, x 15 repse   Shoulder Exercises: Stretch   Other Shoulder Stretches upper back stretch  supine over bolster   Modalities   Modalities Ultrasound;Electrical Stimulation;Moist Heat   Moist Heat Therapy   Number Minutes Moist Heat 15 Minutes   Moist Heat Location --  thoracic   Electrical Stimulation   Electrical Stimulation Location Rt thoracic   Electrical Stimulation Action burst   Electrical Stimulation Parameters to tolerance   Electrical Stimulation Goals Tone;Pain   Ultrasound   Ultrasound Location Rt paraspinals T8-L1   Ultrasound Parameters combo Us/stim, 100%, 1.620mHz, 1.5w/cm2   Ultrasound Goals Pain  tone   Manual Therapy   Manual Therapy Soft tissue mobilization, spinal mobs   Soft tissue mobilization Rt thoracic paraspinals, tight from T7 to L1, better after combo still some banding Mid thoracic CPA mobs and UPA, some tenderness at T8 with CPA and Rt UPA on articular pillar                     PT Long Term Goals - 02/21/16 0855    PT LONG TERM GOAL #1   Title Patient I in HEP for discharge (03/26/16)    Time 10   Period Weeks   Status On-going   PT LONG TERM GOAL #2   Title Trunk and cervical ROM WFL's and painfree ( 03/2116)    Time  10   Period Weeks   Status On-going   PT LONG TERM GOAL #3   Title report reduction of pain =/> 75% with daily activity (03/26/16)    Time 10   Period Weeks   Status On-going   PT LONG TERM GOAL #4   Title Patient to tolerate sitting and standing per his previous status  03/26/16   Time 10   Period Weeks   Status On-going   PT LONG TERM GOAL #5   Title Improve FOTO to </= 29% limitation (03/26/16)    Time 10   Period Weeks   Status On-going               Plan - 02/24/16 0902    Clinical Impression Statement Val reports some decreased pain in the thoracic area after TDN, he didn't want TDN today. So we focused on exercise to release muscles along with manual work and combo.    Rehab Potential Good   PT Frequency 2x / week   PT Duration 4 weeks   PT Treatment/Interventions Ultrasound;Scar  mobilization;Patient/family education;Dry needling;Electrical Stimulation;Cryotherapy;Moist Heat;Therapeutic exercise;Manual techniques;Taping   PT Next Visit Plan TDN/ spinal mobs manual work.  postural strenthening;    Consulted and Agree with Plan of Care Patient      Patient will benefit from skilled therapeutic intervention in order to improve the following deficits and impairments:  Postural dysfunction, Pain, Increased muscle spasms, Decreased range of motion  Visit Diagnosis: Stiffness due to immobility  Abnormal posture  Pain in thoracic spine  Impaired functional mobility and endurance     Problem List Patient Active Problem List   Diagnosis Date Noted  . Synovitis of hand 02/24/2016  . Right knee pain 01/26/2016  . Toe fracture 01/16/2016  . Thoracic back pain 01/10/2016  . Abrasion 01/10/2016  . Low back pain 08/12/2014  . GAD (generalized anxiety disorder) 09/25/2013  . Insomnia 09/25/2013  . Paresthesia of both hands 09/25/2013  . History of pneumothorax 04/07/2010  . TRANSIENT DISORDER INITIATING/MAINTAINING SLEEP 12/02/2007  . OTHER THALASSEMIA 09/30/2007  . WEIGHT LOSS, ABNORMAL 09/30/2007  . PAIN IN THORACIC SPINE 02/12/2007    Raymond [name] PT  02/24/2016, 9:29 AM  [facility] [address] [place], [place], [address] Phone: [phone]   Fax:  [phone]  Name: Raymond [name] MRN: [id number] Date of Birth: 09-May-1986
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
## 2016-02-24 NOTE — Patient Instructions (Signed)
Thank you for coming in today. Return in 1 month.  Use the gel.  Return sooner if needed.

## 2016-02-27 ENCOUNTER — Encounter: Payer: BLUE CROSS/BLUE SHIELD | Admitting: Physical Therapy

## 2016-02-29 ENCOUNTER — Ambulatory Visit (INDEPENDENT_AMBULATORY_CARE_PROVIDER_SITE_OTHER): Payer: BLUE CROSS/BLUE SHIELD | Admitting: Rehabilitative and Restorative Service Providers"

## 2016-02-29 ENCOUNTER — Encounter: Payer: Self-pay | Admitting: Rehabilitative and Restorative Service Providers"

## 2016-02-29 DIAGNOSIS — Z7409 Other reduced mobility: Secondary | ICD-10-CM

## 2016-02-29 DIAGNOSIS — R293 Abnormal posture: Secondary | ICD-10-CM

## 2016-02-29 DIAGNOSIS — M546 Pain in thoracic spine: Secondary | ICD-10-CM

## 2016-02-29 DIAGNOSIS — M623 Immobility syndrome (paraplegic): Secondary | ICD-10-CM | POA: Diagnosis not present

## 2016-02-29 DIAGNOSIS — M2560 Stiffness of unspecified joint, not elsewhere classified: Secondary | ICD-10-CM
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
## 2016-02-29 NOTE — Patient Instructions (Addendum)
Cat / Cow Flow    Inhale, press spine toward ceiling like a Halloween cat. Keeping strength in arms and abdominals, exhale to soften spine through neutral and into cow pose. Open chest and arch back. Initiate movement between cat and cow at tailbone, one vertebrae at a time. Repeat __3-5__ times.    Child Pose    Sitting on knees, fold body over legs and relax head and arms on floor. Hold for __20 seconds 3-5 reps

## 2016-03-05 ENCOUNTER — Encounter: Payer: Self-pay | Admitting: Family Medicine

## 2016-03-06 ENCOUNTER — Ambulatory Visit (INDEPENDENT_AMBULATORY_CARE_PROVIDER_SITE_OTHER): Payer: BLUE CROSS/BLUE SHIELD | Admitting: Psychiatry

## 2016-03-06 ENCOUNTER — Telehealth: Payer: Self-pay | Admitting: *Deleted

## 2016-03-06 ENCOUNTER — Encounter (HOSPITAL_COMMUNITY): Payer: Self-pay | Admitting: Psychiatry

## 2016-03-06 VITALS — BP 112/70 | HR 91 | Ht 68.0 in | Wt 148.0 lb

## 2016-03-06 DIAGNOSIS — F4311 Post-traumatic stress disorder, acute: Secondary | ICD-10-CM | POA: Diagnosis not present

## 2016-03-06 DIAGNOSIS — F411 Generalized anxiety disorder: Secondary | ICD-10-CM | POA: Diagnosis not present

## 2016-03-06 DIAGNOSIS — G4700 Insomnia, unspecified: Secondary | ICD-10-CM | POA: Diagnosis not present

## 2016-03-06 MED ORDER — PAROXETINE HCL 20 MG PO TABS: 20.0000 mg | ORAL_TABLET | Freq: Every day | ORAL | 0 refills | Status: DC
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
## 2016-03-06 NOTE — Telephone Encounter (Signed)
PA submitted for Diclofenac  [id number]

## 2016-03-07 ENCOUNTER — Encounter: Payer: Self-pay | Admitting: Physical Therapy

## 2016-03-08 NOTE — Telephone Encounter (Signed)
Approvedon August 1  Effective from 03/06/2016 through 08/05/2038. Patient and pharm notified

## 2016-03-14 ENCOUNTER — Ambulatory Visit (INDEPENDENT_AMBULATORY_CARE_PROVIDER_SITE_OTHER): Payer: BLUE CROSS/BLUE SHIELD | Admitting: Licensed Clinical Social Worker

## 2016-03-14 DIAGNOSIS — F411 Generalized anxiety disorder: Secondary | ICD-10-CM

## 2016-03-14 DIAGNOSIS — F4310 Post-traumatic stress disorder, unspecified: Secondary | ICD-10-CM

## 2016-03-15 ENCOUNTER — Encounter (HOSPITAL_COMMUNITY): Payer: Self-pay | Admitting: Licensed Clinical Social Worker

## 2016-03-15 DIAGNOSIS — F4310 Post-traumatic stress disorder, unspecified: Secondary | ICD-10-CM | POA: Insufficient documentation
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
## 2016-03-26 ENCOUNTER — Ambulatory Visit: Payer: Self-pay | Admitting: Family Medicine

## 2016-03-29 ENCOUNTER — Encounter (HOSPITAL_COMMUNITY): Payer: Self-pay | Admitting: Psychiatry

## 2016-03-29 ENCOUNTER — Ambulatory Visit (INDEPENDENT_AMBULATORY_CARE_PROVIDER_SITE_OTHER): Payer: BLUE CROSS/BLUE SHIELD | Admitting: Psychiatry

## 2016-03-29 VITALS — BP 120/72 | HR 83 | Ht 68.0 in | Wt 148.0 lb

## 2016-03-29 DIAGNOSIS — G4700 Insomnia, unspecified: Secondary | ICD-10-CM

## 2016-03-29 DIAGNOSIS — F4310 Post-traumatic stress disorder, unspecified: Secondary | ICD-10-CM

## 2016-03-29 DIAGNOSIS — F411 Generalized anxiety disorder: Secondary | ICD-10-CM | POA: Diagnosis not present

## 2016-03-29 MED ORDER — PAROXETINE HCL 20 MG PO TABS: 20.0000 mg | ORAL_TABLET | Freq: Every day | ORAL | 1 refills | Status: DC
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
## 2016-03-30 ENCOUNTER — Ambulatory Visit (INDEPENDENT_AMBULATORY_CARE_PROVIDER_SITE_OTHER): Payer: BLUE CROSS/BLUE SHIELD | Admitting: Family Medicine

## 2016-03-30 ENCOUNTER — Ambulatory Visit (INDEPENDENT_AMBULATORY_CARE_PROVIDER_SITE_OTHER): Payer: BLUE CROSS/BLUE SHIELD

## 2016-03-30 VITALS — BP 140/85 | HR 105 | Wt 148.0 lb

## 2016-03-30 DIAGNOSIS — G4726 Circadian rhythm sleep disorder, shift work type: Secondary | ICD-10-CM | POA: Insufficient documentation

## 2016-03-30 DIAGNOSIS — S92911D Unspecified fracture of right toe(s), subsequent encounter for fracture with routine healing: Secondary | ICD-10-CM

## 2016-03-30 DIAGNOSIS — S92911A Unspecified fracture of right toe(s), initial encounter for closed fracture: Secondary | ICD-10-CM

## 2016-03-30 DIAGNOSIS — X58XXXD Exposure to other specified factors, subsequent encounter: Secondary | ICD-10-CM | POA: Diagnosis not present

## 2016-03-30 MED ORDER — DICLOFENAC SODIUM 1 % TD GEL: 2.0000 g | Freq: Four times a day (QID) | TRANSDERMAL | 11 refills | Status: DC

## 2016-03-30 MED ORDER — ZOLPIDEM TARTRATE 10 MG PO TABS: 5.0000 mg | ORAL_TABLET | Freq: Every evening | ORAL | 3 refills | Status: DC | PRN
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
## 2016-03-30 NOTE — Patient Instructions (Addendum)
Thank you for coming in today. Get xray today.  Start [place].  Follow up with Dr Linford ArnoldMetheney.   Use voltaren gel on the toe as needed.   Insomnia Insomnia is a sleep disorder that makes it difficult to fall asleep or to stay asleep. Insomnia can cause tiredness (fatigue), low energy, difficulty concentrating, mood swings, and poor performance at work or school.  There are three different ways to classify insomnia:  Difficulty falling asleep.  Difficulty staying asleep.  Waking up too early in the morning. Any type of insomnia can be long-term (chronic) or short-term (acute). Both are common. Short-term insomnia usually lasts for three months or less. Chronic insomnia occurs at least three times a week for longer than three months. CAUSES  Insomnia may be caused by another condition, situation, or substance, such as:  Anxiety.  Certain medicines.  Gastroesophageal reflux disease (GERD) or other gastrointestinal conditions.  Asthma or other breathing conditions.  Restless legs syndrome, sleep apnea, or other sleep disorders.  Chronic pain.  Menopause. This may include hot flashes.  Stroke.  Abuse of alcohol, tobacco, or illegal drugs.  Depression.  Caffeine.   Neurological disorders, such as Alzheimer disease.  An overactive thyroid (hyperthyroidism). The cause of insomnia may not be known. RISK FACTORS Risk factors for insomnia include:  Gender. Women are more commonly affected than men.  Age. Insomnia is more common as you get older.  Stress. This may involve your professional or personal life.  Income. Insomnia is more common in people with lower income.  Lack of exercise.   Irregular work schedule or night shifts.  Traveling between different time zones. SIGNS AND SYMPTOMS If you have insomnia, trouble falling asleep or trouble staying asleep is the main symptom. This may lead to other symptoms, such as:  Feeling fatigued.  Feeling nervous about  going to sleep.  Not feeling rested in the morning.  Having trouble concentrating.  Feeling irritable, anxious, or depressed. TREATMENT  Treatment for insomnia depends on the cause. If your insomnia is caused by an underlying condition, treatment will focus on addressing the condition. Treatment may also include:   Medicines to help you sleep.  Counseling or therapy.  Lifestyle adjustments. HOME CARE INSTRUCTIONS   Take medicines only as directed by your health care provider.  Keep regular sleeping and waking hours. Avoid naps.  Keep a sleep diary to help you and your health care provider figure out what could be causing your insomnia. Include:   When you sleep.  When you wake up during the night.  How well you sleep.   How rested you feel the next day.  Any side effects of medicines you are taking.  What you eat and drink.   Make your bedroom a comfortable place where it is easy to fall asleep:  Put up shades or special blackout curtains to block light from outside.  Use a white noise machine to block noise.  Keep the temperature cool.   Exercise regularly as directed by your health care provider. Avoid exercising right before bedtime.  Use relaxation techniques to manage stress. Ask your health care provider to suggest some techniques that may work well for you. These may include:  Breathing exercises.  Routines to release muscle tension.  Visualizing peaceful scenes.  Cut back on alcohol, caffeinated beverages, and cigarettes, especially close to bedtime. These can disrupt your sleep.  Do not overeat or eat spicy foods right before bedtime. This can lead to digestive discomfort that can make  it hard for you to sleep.  Limit screen use before bedtime. This includes:  Watching TV.  Using your smartphone, tablet, and computer.  Stick to a routine. This can help you fall asleep faster. Try to do a quiet activity, brush your teeth, and go to bed at the  same time each night.  Get out of bed if you are still awake after 15 minutes of trying to sleep. Keep the lights down, but try reading or doing a quiet activity. When you feel sleepy, go back to bed.  Make sure that you drive carefully. Avoid driving if you feel very sleepy.  Keep all follow-up appointments as directed by your health care provider. This is important. SEEK MEDICAL CARE IF:   You are tired throughout the day or have trouble in your daily routine due to sleepiness.  You continue to have sleep problems or your sleep problems get worse. SEEK IMMEDIATE MEDICAL CARE IF:   You have serious thoughts about hurting yourself or someone else.   This information is not intended to replace advice given to you by your health care provider. Make sure you discuss any questions you have with your health care provider.   Document Released: 07/20/2000 Document Revised: 04/13/2015 Document Reviewed: 04/23/2014 Elsevier Interactive Patient Education [employer]

## 2016-04-02 ENCOUNTER — Encounter: Payer: Self-pay | Admitting: Family Medicine

## 2016-04-03 MED ORDER — PREGABALIN 200 MG PO CAPS: 200.0000 mg | ORAL_CAPSULE | Freq: Three times a day (TID) | ORAL | 1 refills | Status: DC

## 2016-04-04 ENCOUNTER — Encounter: Payer: Self-pay | Admitting: Physical Therapy

## 2016-04-05 ENCOUNTER — Encounter: Payer: Self-pay | Admitting: Rehabilitative and Restorative Service Providers"

## 2016-04-05 ENCOUNTER — Ambulatory Visit (INDEPENDENT_AMBULATORY_CARE_PROVIDER_SITE_OTHER): Payer: BLUE CROSS/BLUE SHIELD | Admitting: Rehabilitative and Restorative Service Providers"

## 2016-04-05 DIAGNOSIS — R293 Abnormal posture: Secondary | ICD-10-CM

## 2016-04-05 DIAGNOSIS — M546 Pain in thoracic spine: Secondary | ICD-10-CM | POA: Diagnosis not present

## 2016-04-05 DIAGNOSIS — M623 Immobility syndrome (paraplegic): Secondary | ICD-10-CM | POA: Diagnosis not present

## 2016-04-05 DIAGNOSIS — Z7409 Other reduced mobility: Secondary | ICD-10-CM

## 2016-04-05 DIAGNOSIS — M2560 Stiffness of unspecified joint, not elsewhere classified: Secondary | ICD-10-CM

## 2016-04-05 NOTE — Therapy (Signed)
[facility] [address] [place], [place], [address] Phone: [phone]   Fax:  [phone]  Physical Therapy Treatment  Patient Details  Name: Raymond PlumeJason L [name] MRN: [id number] Date of Birth: [birth year] Referring Provider: Dr. Denyse Amassorey  Encounter Date: 04/05/2016      PT End of Session - 04/05/16 0939    Visit Number 11   Number of Visits 20   Date for PT Re-Evaluation 05/17/16   PT Start Time 0939   PT Stop Time 1030   PT Time Calculation (min) 51 min   Activity Tolerance Patient tolerated treatment well      Past Medical History:  Diagnosis Date  . Spontaneous pneumothorax    Two in the past 2011  . WEIGHT LOSS, ABNORMAL 09/30/2007   Qualifier: Diagnosis of  By: Thomos LemonsBowen DO, Karen      Past Surgical History:  Procedure Laterality Date  . chest tubes    . INGUINAL HERNIA REPAIR Left 10/12/13    There were no vitals filed for this visit.      Subjective Assessment - 04/05/16 0940    Subjective patient reports that his back has been bothering him - mostly on the Rt in the past couple of weeks. He started a third shift job and is having difficulty adjusting with his sleep schedule. 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 He has sharp pain in the Rt back which sometimes cathes his breathe, He is taking tylenol and prescriptioin antiinflammatory which helps some. He has had a headache for the past two days.    Currently in Pain? Yes   Pain Score 6    Pain Location Thoracic   Pain Orientation Right   Pain Descriptors / Indicators Aching;Sore;Sharp   Pain Type Acute pain   Pain Onset More than a month ago   Pain Frequency Constant   Aggravating Factors  lifting; reaching; lying down or sitting   Pain Relieving Factors meds - some help             [facility] PT Assessment - 04/05/16 0001      Assessment   Medical Diagnosis Rt thoracic pain   Referring  Provider Dr. Denyse Amassorey   Onset Date/Surgical Date 12/31/15   Hand Dominance Right   Next MD Visit none scheduled      Observation/Other Assessments   Focus on Therapeutic Outcomes (FOTO)  56% limitation      Posture/Postural Control   Posture Comments head forward; shoulders rounded and elevated; increased thoracic kyphosis - posture more forward in sitting      AROM   AROM Assessment Site --  lumbar mobility decreased 25-30%    Cervical Flexion 67   Cervical Extension 68   Cervical - Right Side Bend 49   Cervical - Left Side Bend 42   Cervical - Right Rotation 68   Cervical - Left Rotation 67   Thoracic - Right Rotation decreased 50%   Thoracic - Left Rotation decreased 40%      Strength   Overall Strength Comments 5/5 bilat UE's      Palpation   Spinal mobility hypomobile through lower thoracic spine    Palpation comment tight band of muscle Rt thoracic T5- L2 , Lt side T8-L1; cervical musculature ant/lat/post and upper traps                      [facility] Treatment/Exercise -  04/05/16 0001      Shoulder Exercises: Stretch   Other Shoulder Stretches 3 way doorway stretch 30 sec x 3      Moist Heat Therapy   Number Minutes Moist Heat 20 Minutes   Moist Heat Location --  thoracic     Electrical Stimulation   Electrical Stimulation Location Rt thoracic    Electrical Stimulation Action IFC   Electrical Stimulation Parameters to tolerance   Electrical Stimulation Goals Tone;Pain     Manual Therapy   Manual Therapy Soft tissue mobilization   Manual therapy comments pt prone    Joint Mobilization CPA mobs lower to mid T-spine ~T4/5 to T9/10    Soft tissue mobilization bilat thoracic paraspinals   Myofascial Release thoracic           Trigger Point Dry Needling - 04/05/16 1102    Consent Given? Yes   Longissimus Response Twitch response elicited;Palpable increased muscle length  bilat thoracic paraspinals - palpable decrease in tightness                PT Education - 04/05/16 1103    Education provided Yes   Education Details encouraged pt to resume HEP; avoid repetitive lifting and lying on stomach to sleep - discussed prone face cradle and possibly purchase of TENS unit   Person(s) Educated Patient   Methods Explanation   Comprehension Verbalized understanding             PT Long Term Goals - 04/05/16 1108      PT LONG TERM GOAL #1   Title Patient I in HEP for discharge (05/17/16)    Time 6   Period Weeks   Status Revised     PT LONG TERM GOAL #2   Title Trunk and cervical ROM WFL's and painfree ( 05/17/16)    Time 6   Period Weeks   Status Revised     PT LONG TERM GOAL #3   Title report reduction of pain =/> 75% with daily activity (05/17/16)    Time 6   Period Weeks   Status Revised     PT LONG TERM GOAL #4   Title Patient to tolerate sitting and standing per his previous status  (05/17/16)   Time 6   Period Weeks   Status Revised     PT LONG TERM GOAL #5   Title Improve FOTO to </= 29% limitation (05/17/16)    Time 6   Period Weeks   Status Revised               Plan - 04/05/16 1104    Clinical Impression Statement Eitan presents with flare up of Rt thoracic pain after starting a job requiring repetitive lifting. He has constant Rt posterior thoracic pain; limited mobilty; difficulty sleeping; decreased activity tolerance. Patient is working third shift and has difficulty with sleeping. He has been using medication to assist with sleep but reports that he still has to move to his stomach to sleep when he awakens.   Rehab Potential Good   PT Frequency 2x / week   PT Duration 6 weeks   PT Treatment/Interventions Ultrasound;Scar mobilization;Patient/family education;Dry needling;Electrical Stimulation;Cryotherapy;Moist Heat;Therapeutic exercise;Manual techniques;Taping   PT Next Visit Plan TDN/ spinal mobs manual work.  postural strengthening; education and home HEP    Consulted  and Agree with Plan of Care Patient      Patient will benefit from skilled therapeutic intervention in order to improve the following deficits and impairments:  Postural  dysfunction, Pain, Increased muscle spasms, Decreased range of motion  Visit Diagnosis: Stiffness due to immobility - Plan: PT plan of care cert/re-cert  Abnormal posture - Plan: PT plan of care cert/re-cert  Pain in thoracic spine - Plan: PT plan of care cert/re-cert     Problem List Patient Active Problem List   Diagnosis Date Noted  . 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 PTSD (post-traumatic stress disorder) 03/15/2016  . Synovitis of hand 02/24/2016  . Right knee pain 01/26/2016  . Toe fracture 01/16/2016  . Thoracic back pain 01/10/2016  . Abrasion 01/10/2016  . Low back pain 08/12/2014  . GAD (generalized anxiety disorder) 09/25/2013  . Insomnia 09/25/2013  . Paresthesia of both hands 09/25/2013  . History of pneumothorax 04/07/2010  . TRANSIENT DISORDER INITIATING/MAINTAINING SLEEP 12/02/2007  . OTHER THALASSEMIA 09/30/2007  . WEIGHT LOSS, ABNORMAL 09/30/2007  . PAIN IN THORACIC SPINE 02/12/2007    Lynelle Weiler Rober Minion PT, MPH  04/05/2016, 11:14 AM  [facility] [address] [place], [place], [address] Phone: [phone]   Fax:  [phone]  Name: REHAAN VILORIA MRN: [id number] Date of Birth: Oct 24, 1985

## 2016-04-12 ENCOUNTER — Ambulatory Visit (INDEPENDENT_AMBULATORY_CARE_PROVIDER_SITE_OTHER): Payer: BLUE CROSS/BLUE SHIELD | Admitting: Physical Therapy

## 2016-04-12 DIAGNOSIS — R293 Abnormal posture: Secondary | ICD-10-CM

## 2016-04-12 DIAGNOSIS — Z7409 Other reduced mobility: Secondary | ICD-10-CM | POA: Diagnosis not present

## 2016-04-12 DIAGNOSIS — M546 Pain in thoracic spine: Secondary | ICD-10-CM | POA: Diagnosis not present

## 2016-04-12 DIAGNOSIS — M623 Immobility syndrome (paraplegic): Secondary | ICD-10-CM | POA: Diagnosis not present

## 2016-04-12 DIAGNOSIS — M2560 Stiffness of unspecified joint, not elsewhere classified: Secondary | ICD-10-CM

## 2016-04-12 NOTE — Therapy (Signed)
[facility] [place] [address] [place], [place], [address] Phone: [phone]   Fax:  [phone]  Physical Therapy Treatment  Patient Details  Name: Raymond [name] MRN: [id number] Date of Birth: 01-19-1986 Referring Provider: Dr. Denyse Amass   Encounter Date: 04/12/2016      PT End of Session - 04/12/16 1456    Visit Number 12   Number of Visits 20   Date for PT Re-Evaluation 05/17/16   PT Start Time 1453  pt arrived late   PT Stop Time 1535   PT Time Calculation (min) 42 min   Activity Tolerance Patient tolerated treatment well;No increased pain      Past Medical History:  Diagnosis Date  . Spontaneous pneumothorax    Two in the past 2011  . WEIGHT LOSS, ABNORMAL 09/30/2007   Qualifier: Diagnosis of  By: Thomos Lemons      Past Surgical History:  Procedure Laterality Date  . chest tubes    . INGUINAL HERNIA REPAIR Left 10/12/13    There were no vitals filed for this visit.      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  He says he usually feels pretty good after work.  It is when he is "chillin" and still is when the pain starts.    Patient Stated Goals not have the back pain or greatly reduce it.    Currently in Pain? Yes   Pain Score 4    Pain Location Thoracic   Pain Orientation Right   Pain Descriptors / Indicators Dull;Sharp   Aggravating Factors  relaxed sitting    Pain Relieving Factors chair massager.             [facility] PT Assessment - 04/12/16 0001      Assessment   Medical Diagnosis Rt thoracic pain   Referring Provider Dr. Denyse Amass    Onset Date/Surgical Date 12/31/15   Hand Dominance Right   Next MD Visit PRN            [facility] Adult PT Treatment/Exercise - 04/12/16 0001      Exercises   Exercises Shoulder;Lumbar     Neck Exercises: Machines for Strengthening   UBE (Upper Arm Bike) L2 x 4' alternating directions,  standing.      Lumbar Exercises: Quadruped   Madcat/Old Horse 10 reps   Opposite Arm/Leg Raise Right arm/Left leg;Left arm/Right leg;5 reps  2 sets   Other Quadruped Lumbar Exercises Thoracic rotation with one hand behind back, with assisted motion from PTA x 10 reps with Rt rotation and 5 reps with rotation Lt.       Programme researcher, broadcasting/film/video Location Rt thoracic paraspinals.    Electrical Stimulation Action combo [place]   Electrical Stimulation Parameters to tolerance    Electrical Stimulation Goals Tone;Pain     Ultrasound   Ultrasound Location Rt thoracic paraspinals.   Ultrasound Parameters combo [place].  100%, 1.1 w/cm2, 8 min    Ultrasound Goals Pain  tightness     Manual Therapy   Manual Therapy Myofascial release;Soft tissue mobilization   Manual therapy comments pt prone    Soft tissue mobilization gentle strumming to Rt thoracic paraspinals, TPR to same area    Myofascial Release Rt thoracic paraspinals., lower trap, rhomboid   Kinesiotex Inhibit Muscle     Kinesiotix   Inhibit Muscle  I strip placed with 15% stretch to  Rt lower trap; I strip placed with 70% stretch perpendicular to I  strip in middle to decrease pain, decompress tissue.             PT Long Term Goals - 04/05/16 1108      PT LONG TERM GOAL #1   Title Patient I in HEP for discharge (05/17/16)    Time 6   Period Weeks   Status Revised     PT LONG TERM GOAL #2   Title Trunk and cervical ROM WFL's and painfree ( 05/17/16)    Time 6   Period Weeks   Status Revised     PT LONG TERM GOAL #3   Title report reduction of pain =/> 75% with daily activity (05/17/16)    Time 6   Period Weeks   Status Revised     PT LONG TERM GOAL #4   Title Patient to tolerate sitting and standing per his previous status  (05/17/16)   Time 6   Period Weeks   Status Revised     PT LONG TERM GOAL #5   Title Improve FOTO to </= 29% limitation (05/17/16)    Time 6   Period Weeks   Status  Revised               Plan - 04/12/16 1539    Clinical Impression Statement Pt had positive response to last session.  He tolerated all exercises well, without increase in pain.  Pt reported decrease in pain to 1/10 at end of session. Progressing towards unmet goals.    Rehab Potential Good   PT Frequency 2x / week   PT Duration 6 weeks   PT Treatment/Interventions Ultrasound;Scar mobilization;Patient/family education;Dry needling;Electrical Stimulation;Cryotherapy;Moist Heat;Therapeutic exercise;Manual techniques;Taping   PT Next Visit Plan Assess response to manual therapy, combo, Rock tape.    Consulted and Agree with Plan of Care Patient      Patient will benefit from skilled therapeutic intervention in order to improve the following deficits and impairments:  Postural dysfunction, Pain, Increased muscle spasms, Decreased range of motion  Visit Diagnosis: Stiffness due to immobility  Abnormal posture  Pain in thoracic spine  Impaired functional mobility and endurance     Problem List Patient Active Problem List   Diagnosis Date Noted  . 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 PTSD (post-traumatic stress disorder) 03/15/2016  . Synovitis of hand 02/24/2016  . Right knee pain 01/26/2016  . Toe fracture 01/16/2016  . Thoracic back pain 01/10/2016  . Abrasion 01/10/2016  . Low back pain 08/12/2014  . GAD (generalized anxiety disorder) 09/25/2013  . Insomnia 09/25/2013  . Paresthesia of both hands 09/25/2013  . History of pneumothorax 04/07/2010  . TRANSIENT DISORDER INITIATING/MAINTAINING SLEEP 12/02/2007  . OTHER THALASSEMIA 09/30/2007  . WEIGHT LOSS, ABNORMAL 09/30/2007  . PAIN IN THORACIC SPINE 02/12/2007   Mayer CamelJennifer Carlson-Long, PTA 04/12/16 4:48 PM  [facility] [address] [place], [place], [address] Phone: [phone]   Fax:  [phone]  Name: Raymond [name] MRN: [id number] Date of  Birth: [birth year]

## 2016-04-19 ENCOUNTER — Ambulatory Visit (INDEPENDENT_AMBULATORY_CARE_PROVIDER_SITE_OTHER): Payer: BLUE CROSS/BLUE SHIELD | Admitting: Physical Therapy

## 2016-04-19 DIAGNOSIS — M546 Pain in thoracic spine: Secondary | ICD-10-CM | POA: Diagnosis not present

## 2016-04-19 DIAGNOSIS — Z7409 Other reduced mobility: Secondary | ICD-10-CM | POA: Diagnosis not present

## 2016-04-19 DIAGNOSIS — M623 Immobility syndrome (paraplegic): Secondary | ICD-10-CM | POA: Diagnosis not present

## 2016-04-19 DIAGNOSIS — R293 Abnormal posture: Secondary | ICD-10-CM | POA: Diagnosis not present

## 2016-04-19 DIAGNOSIS — M2560 Stiffness of unspecified joint, not elsewhere classified: Secondary | ICD-10-CM

## 2016-04-19 NOTE — Therapy (Addendum)
[place], [place], [address] Phone: [phone]   Fax:  [phone]  Physical Therapy Treatment  Patient Details  Name: Raymond [name] MRN: [id number] Date of Birth: [birth year] Referring Provider: Dr. Georgina Snell   Encounter Date: 04/19/2016      PT End of Session - 04/19/16 1200    Visit Number 13   Number of Visits 20   Date for PT Re-Evaluation 05/17/16   PT Start Time 0947  pt arrived late   PT Stop Time 1239   PT Time Calculation (min) 46 min   Activity Tolerance Patient tolerated treatment well;No increased pain   Behavior During Therapy WFL for tasks assessed/performed      Past Medical History:  Diagnosis Date  . Spontaneous pneumothorax    Two in the past 2011  . WEIGHT LOSS, ABNORMAL 09/30/2007   Qualifier: Diagnosis of  By: Esmeralda Arthur      Past Surgical History:  Procedure Laterality Date  . chest tubes    . INGUINAL HERNIA REPAIR Left 10/12/13    There were no vitals filed for this visit.      Subjective Assessment - 04/19/16 1156    Subjective Pt reports he had pain relief after last session. No longer has pain sitting on couch, but is also using pillow behind back.  Pain no longer constant. He has continued to perform HEP 2-3x/ day   Patient Stated Goals not have the back pain or greatly reduce it.    Currently in Pain? Yes   Pain Score 2    Pain Location Thoracic   Pain Orientation Right   Pain Descriptors / Indicators Dull   Aggravating Factors  certain postures   Pain Relieving Factors changing positions.             [facility] PT Assessment - 04/19/16 0001      Assessment   Medical Diagnosis Rt thoracic pain   Referring Provider Dr. Georgina Snell    Onset Date/Surgical Date 12/31/15   Hand Dominance Right   Next MD Visit PRN     Observation/Other Assessments   Focus on Therapeutic Outcomes (FOTO)  14% limited     AROM   Right/Left Shoulder --  WNL, painfree   Cervical Flexion --  WNL   Cervical Extension --  WNL   Cervical - Right Side Bend --  WNL   Cervical - Left Side Bend WNL   Cervical - Right Rotation WNL   Cervical - Left Rotation WNL   Thoracic Flexion --  WNL   Thoracic Extension WNL   Thoracic - Right Rotation WNL   Thoracic - Left Rotation WNL                     [facility] Adult PT Treatment/Exercise - 04/19/16 0001      Lumbar Exercises: Seated   Other Seated Lumbar Exercises spinal flexion sitting reaching between knees with segmental flexion through cervical and thoracic spine for strentch 20 sec x 5     Lumbar Exercises: Quadruped   Madcat/Old Horse 10 reps   Opposite Arm/Leg Raise Right arm/Left leg;Left arm/Right leg;5 reps  2 sets   Other Quadruped Lumbar Exercises Thoracic rotation with one hand behind back, with assisted motion from PTA x 10 reps with Rt rotation and 5 reps with rotation Lt.       Building surveyor  combo [place]   Electrical Stimulation Parameters to tolerance    Electrical Stimulation Goals Tone;Pain     Ultrasound   Ultrasound Location Rt/Lt thoracic paraspinals   Ultrasound Parameters  combo [place], 1.1 w/cm2, 100%; 4 min each area   Ultrasound Goals Pain;Other (Comment)  tightness     Manual Therapy   Manual therapy comments pt prone    Soft tissue mobilization gentle strumming to Rt/Lt  thoracic paraspinals, rhomboids   Myofascial Release Rt/Lt  thoracic paraspinals., lower trap, rhomboid   Kinesiotex --  pt declined                      PT Long Term Goals - 04/19/16 1213      PT LONG TERM GOAL #1   Title Patient I in HEP for discharge (05/17/16)    Time 6   Period Weeks   Status On-going     PT LONG TERM GOAL #2   Title Trunk and cervical ROM WFL's and painfree ( 05/17/16)    Time 6   Period Weeks   Status Achieved     PT LONG TERM GOAL #3   Title report  reduction of pain =/> 75% with daily activity (05/17/16)    Time 6   Period Weeks   Status Achieved     PT LONG TERM GOAL #4   Title Patient to tolerate sitting and standing per his previous status  (05/17/16)   Time 6   Period Weeks   Status Achieved     PT LONG TERM GOAL #5   Title Improve FOTO to </= 29% limitation (05/17/16)    Time 6   Period Weeks   Status Achieved               Plan - 04/19/16 1258    Clinical Impression Statement Pt demonstrated pain free neck, shoulder, thoracic mobility.  He reported resolution of thoracic back pain at end of session.  Pt requests to hold therapy for 2 wks while he contiues HEP.    Rehab Potential Good   PT Frequency 2x / week   PT Duration 6 weeks   PT Treatment/Interventions Ultrasound;Scar mobilization;Patient/family education;Dry needling;Electrical Stimulation;Cryotherapy;Moist Heat;Therapeutic exercise;Manual techniques;Taping   PT Next Visit Plan Spoke to supervising PT; will hold therapy for 2 wks.  If pt doesn't return will d/c to HEP.    Consulted and Agree with Plan of Care Patient      Patient will benefit from skilled therapeutic intervention in order to improve the following deficits and impairments:  Postural dysfunction, Pain, Increased muscle spasms, Decreased range of motion  Visit Diagnosis: Stiffness due to immobility  Abnormal posture  Pain in thoracic spine  Impaired functional mobility and endurance     Problem List Patient Active Problem List   Diagnosis Date Noted  . 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 PTSD (post-traumatic stress disorder) 03/15/2016  . Synovitis of hand 02/24/2016  . Right knee pain 01/26/2016  . Toe fracture 01/16/2016  . Thoracic back pain 01/10/2016  . Abrasion 01/10/2016  . Low back pain 08/12/2014  . GAD (generalized anxiety disorder) 09/25/2013  . Insomnia 09/25/2013  . Paresthesia of both hands 09/25/2013  . History of pneumothorax 04/07/2010  . TRANSIENT  DISORDER INITIATING/MAINTAINING SLEEP 12/02/2007  . OTHER THALASSEMIA 09/30/2007  . WEIGHT LOSS, ABNORMAL 09/30/2007  . PAIN IN THORACIC SPINE 02/12/2007    Shelbie Hutching 04/19/2016, 1:21 PM  [facility] [address]  [place], [place], [address] Phone: [phone]   Fax:  [phone]  Name: Raymond [name] MRN: [id number] Date of Birth: 12/18/1985   PHYSICAL THERAPY DISCHARGE SUMMARY  Visits from Start of Care: 13  Current functional level related to goals / functional outcomes: Good progress with rehab. See last progress note for discharge status.   Remaining deficits: unknown   Education / Equipment: HEP Plan: Patient agrees to discharge.  Patient goals were partially met. Patient is being discharged due to not returning since the last visit.  ?????    Celyn P. Helene Kelp PT, MPH 07/03/16 9:57 AM

## 2016-05-02 ENCOUNTER — Ambulatory Visit (INDEPENDENT_AMBULATORY_CARE_PROVIDER_SITE_OTHER): Payer: BLUE CROSS/BLUE SHIELD

## 2016-05-02 ENCOUNTER — Encounter: Payer: Self-pay | Admitting: Family Medicine

## 2016-05-02 ENCOUNTER — Ambulatory Visit (INDEPENDENT_AMBULATORY_CARE_PROVIDER_SITE_OTHER): Payer: BLUE CROSS/BLUE SHIELD | Admitting: Family Medicine

## 2016-05-02 VITALS — BP 126/79 | HR 98 | Temp 98.0°F | Wt 148.0 lb

## 2016-05-02 DIAGNOSIS — S92911D Unspecified fracture of right toe(s), subsequent encounter for fracture with routine healing: Secondary | ICD-10-CM | POA: Diagnosis not present

## 2016-05-02 DIAGNOSIS — S92911A Unspecified fracture of right toe(s), initial encounter for closed fracture: Secondary | ICD-10-CM

## 2016-05-02 DIAGNOSIS — J069 Acute upper respiratory infection, unspecified: Secondary | ICD-10-CM

## 2016-05-02 DIAGNOSIS — X58XXXD Exposure to other specified factors, subsequent encounter: Secondary | ICD-10-CM | POA: Diagnosis not present

## 2016-05-02 MED ORDER — HYDROCODONE-HOMATROPINE 5-1.5 MG/5ML PO SYRP: 5.0000 mL | ORAL_SOLUTION | Freq: Three times a day (TID) | ORAL | 0 refills | Status: DC | PRN

## 2016-05-02 MED ORDER — IPRATROPIUM BROMIDE 0.06 % NA SOLN: 2.0000 | NASAL | 6 refills | Status: DC | PRN

## 2016-05-02 NOTE — Progress Notes (Signed)
Raymond [name] is a 30 y.o. male who presents to [name]: Primary Care Sports Medicine today for follow-up toe fracture and cough congestion and runny nose subjective fevers chills and body aches.  Toe fracture: Patient has had a toe fracture now for some time in his right foot. It's been healing well and is minimally painful now. One month ago the fracture showed incomplete healing on x-ray.  Cough congestion runny nose: Patient has a 2 to three-day history of these symptoms. Cough is very bothersome especially at night. He notes a postnasal drainage mild subjective wheezing and mild body aches. He has not tried much medications yet for this problem.   Past Medical History:  Diagnosis Date  . Spontaneous pneumothorax    Two in the past 2011  . WEIGHT LOSS, ABNORMAL 09/30/2007   Qualifier: Diagnosis of  By: Thomos Lemons     Past Surgical History:  Procedure Laterality Date  . chest tubes    . INGUINAL HERNIA REPAIR Left 10/12/13   Social History  Substance Use Topics  . Smoking status: Former Smoker    Packs/day: 0.75    Types: E-cigarettes  . Smokeless tobacco: Never Used  . Alcohol use No     Comment: Stopped Drinking in December. Was drinking 2-3 beers a day.   family history includes Anxiety disorder in his mother; Coronary artery disease in his maternal grandfather; Depression in his mother; Drug abuse in his brother; Hypertension in his father.  ROS as above:  Medications: Current Outpatient Prescriptions  Medication Sig Dispense Refill  . diclofenac sodium (VOLTAREN) 1 % GEL Apply 2 g topically 4 (four) times daily. To affected joint. 100 g 11  . HYDROcodone-homatropine (HYCODAN) 5-1.5 MG/5ML syrup Take 5 mLs by mouth every 8 (eight) hours as needed for cough. 60 mL 0  . ipratropium (ATROVENT) 0.06 % nasal spray Place 2 sprays into both nostrils every 4 (four) hours as needed  for rhinitis. 10 mL 6  . PARoxetine (PAXIL) 20 MG tablet Take 1 tablet (20 mg total) by mouth daily. 30 tablet 1  . pregabalin (LYRICA) 200 MG capsule Take 1 capsule (200 mg total) by mouth 3 (three) times daily. 90 capsule 1  . zolpidem (AMBIEN) 10 MG tablet Take 0.5-1 tablets (5-10 mg total) by mouth at bedtime as needed for sleep. 30 tablet 3   No current facility-administered medications for this visit.    Allergies  Allergen Reactions  . Baclofen Nausea Only  . Cyclobenzaprine Other (See Comments)    Dry mouth  . Prednisone Other (See Comments)    Extremely Irritable.      Exam:  BP 126/79   Pulse 98   Temp 98 F (36.7 C)   Wt 148 lb (67.1 kg)   SpO2 100%   BMI 22.50 kg/m   Gen: Well NAD HEENT: EOMI,  MMM clear nasal discharge. Posterior pharynx with cobblestoning. Lungs: Normal work of breathing. CTABL Heart: RRR no MRG Abd: NABS, Soft. Nondistended, Nontender Exts: Brisk capillary refill, warm and well perfused.  Right second toe minimally tender. Normal-appearing.  No results found for this or any previous visit (from the past 24 hour(s)). No results found.    Assessment and Plan: 30 y.o. male with  1) viral URI. Symptomatically management with Tylenol Atrovent nasal spray and Hycodan cough syrup. Return as needed.  2) right second toe fracture: Doing well clinically. X-ray pending.   Orders Placed This Encounter  Procedures  . DG Toe 2nd Right    Order Specific Question:   Reason for exam:    Answer:   eval fx    Order Specific Question:   Preferred imaging location?    [facility]    Discussed warning signs or symptoms. Please see discharge instructions. Patient expresses understanding.

## 2016-05-02 NOTE — Patient Instructions (Signed)
Thank you for coming in today. Get xray.  Use cough medicine and nasal spray.  Take tylenol for pain and fever as needed.  Call or go to the emergency room if you get worse, have trouble breathing, have chest pains, or palpitations.

## 2016-05-09 ENCOUNTER — Encounter: Payer: Self-pay | Admitting: Family Medicine

## 2016-05-21 ENCOUNTER — Ambulatory Visit (INDEPENDENT_AMBULATORY_CARE_PROVIDER_SITE_OTHER): Payer: BLUE CROSS/BLUE SHIELD

## 2016-05-21 ENCOUNTER — Ambulatory Visit (INDEPENDENT_AMBULATORY_CARE_PROVIDER_SITE_OTHER): Payer: BLUE CROSS/BLUE SHIELD | Admitting: Family Medicine

## 2016-05-21 ENCOUNTER — Encounter: Payer: Self-pay | Admitting: Family Medicine

## 2016-05-21 VITALS — BP 122/68 | HR 77 | Wt 148.0 lb

## 2016-05-21 DIAGNOSIS — S92514A Nondisplaced fracture of proximal phalanx of right lesser toe(s), initial encounter for closed fracture: Secondary | ICD-10-CM | POA: Diagnosis not present

## 2016-05-21 DIAGNOSIS — X58XXXD Exposure to other specified factors, subsequent encounter: Secondary | ICD-10-CM

## 2016-05-21 DIAGNOSIS — S92911D Unspecified fracture of right toe(s), subsequent encounter for fracture with routine healing: Secondary | ICD-10-CM | POA: Diagnosis not present

## 2016-05-21 MED ORDER — LIDOCAINE 5 % EX OINT: 1.0000 "application " | TOPICAL_OINTMENT | Freq: Every day | CUTANEOUS | 11 refills | Status: DC | PRN

## 2016-05-21 MED ORDER — METHOCARBAMOL 500 MG PO TABS: 500.0000 mg | ORAL_TABLET | Freq: Three times a day (TID) | ORAL | 2 refills | Status: DC

## 2016-05-21 NOTE — Patient Instructions (Signed)
Thank you for coming in today. Use a carbon fiber toe plate for your toe.  Continue buddy tape.  Return in 1 month.  Use robaxin.  I will discuss with Dr. Linford ArnoldMetheney.    Buddy Taping You have a minor finger or toe injury. It can be managed by buddy taping. Buddy taping means the injured finger or toe is taped to a healthy uninjured adjacent finger or toe. Most minor fractures and dislocations of the smaller fingers and toes will heal in 3 to 4 weeks. Buddy taping immobilizes and protects the area of injury. Buddy taping is not recommended for initial treatment of fractures of the thumb, longer fingers, or the great toe. Buddy taping should not be used for unstable or deformed fractures, but as fracture healing progresses it may be used for protection during rehabilitation. Fractured fingers and toes should be protected by buddy taping as long as the injury is still painful or swollen.  When an injury is buddy taped, place a small piece of gauze or cotton between the digits that are taped. This helps prevent the skin from breaking down from increased moisture. Buddy taping allows you to get your injury wet when you bathe. Change the gauze and tape more often if it gets wet, and dry the space between the finger or toes. Use a sturdy, hard-soled shoe for better support if you have a fractured toe. In 2 to 3 weeks you can start motion exercises. This will keep the fingers or toes from becoming stiff.  SEEK IMMEDIATE MEDICAL CARE IF:   The injured area becomes cold, numb, or pale.  You have pain not controlled with medications.  You notice increasing deformity of the toe or finger.   This information is not intended to replace advice given to you by your health care provider. Make sure you discuss any questions you have with your health care provider.   Document Released: 08/30/2004 Document Revised: 08/13/2014 Document Reviewed: 12/15/2014 Elsevier Interactive Patient Education [employer]2016 Elsevier  Inc.
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
## 2016-05-23 MED ORDER — LIDOCAINE HCL 3 % EX CREA: 1.0000 "application " | TOPICAL_CREAM | CUTANEOUS | 12 refills | Status: DC | PRN

## 2016-05-24 ENCOUNTER — Ambulatory Visit (HOSPITAL_COMMUNITY): Payer: Self-pay | Admitting: Psychiatry

## 2016-05-26 ENCOUNTER — Other Ambulatory Visit (HOSPITAL_COMMUNITY): Payer: Self-pay | Admitting: Psychiatry

## 2016-05-28 ENCOUNTER — Other Ambulatory Visit: Payer: Self-pay | Admitting: Family Medicine

## 2016-05-29 ENCOUNTER — Ambulatory Visit (INDEPENDENT_AMBULATORY_CARE_PROVIDER_SITE_OTHER): Payer: BLUE CROSS/BLUE SHIELD | Admitting: Psychiatry

## 2016-05-29 ENCOUNTER — Encounter (HOSPITAL_COMMUNITY): Payer: Self-pay | Admitting: Psychiatry

## 2016-05-29 VITALS — BP 116/70 | HR 82 | Resp 16 | Ht 68.0 in | Wt 149.0 lb

## 2016-05-29 DIAGNOSIS — Z818 Family history of other mental and behavioral disorders: Secondary | ICD-10-CM

## 2016-05-29 DIAGNOSIS — F4311 Post-traumatic stress disorder, acute: Secondary | ICD-10-CM

## 2016-05-29 DIAGNOSIS — F411 Generalized anxiety disorder: Secondary | ICD-10-CM | POA: Diagnosis not present

## 2016-05-29 DIAGNOSIS — F5102 Adjustment insomnia: Secondary | ICD-10-CM | POA: Diagnosis not present

## 2016-05-29 DIAGNOSIS — Z8489 Family history of other specified conditions: Secondary | ICD-10-CM

## 2016-05-29 DIAGNOSIS — Z813 Family history of other psychoactive substance abuse and dependence: Secondary | ICD-10-CM

## 2016-05-29 DIAGNOSIS — Z79899 Other long term (current) drug therapy: Secondary | ICD-10-CM

## 2016-05-29 DIAGNOSIS — Z811 Family history of alcohol abuse and dependence: Secondary | ICD-10-CM

## 2016-05-29 DIAGNOSIS — F4310 Post-traumatic stress disorder, unspecified: Secondary | ICD-10-CM

## 2016-05-29 DIAGNOSIS — Z87891 Personal history of nicotine dependence: Secondary | ICD-10-CM

## 2016-05-29 DIAGNOSIS — Z8249 Family history of ischemic heart disease and other diseases of the circulatory system: Secondary | ICD-10-CM

## 2016-05-29 MED ORDER — PAROXETINE HCL 20 MG PO TABS: 20.0000 mg | ORAL_TABLET | Freq: Every day | ORAL | 1 refills | Status: DC
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
## 2016-06-18 ENCOUNTER — Ambulatory Visit (INDEPENDENT_AMBULATORY_CARE_PROVIDER_SITE_OTHER): Payer: BLUE CROSS/BLUE SHIELD

## 2016-06-18 ENCOUNTER — Ambulatory Visit (INDEPENDENT_AMBULATORY_CARE_PROVIDER_SITE_OTHER): Payer: BLUE CROSS/BLUE SHIELD | Admitting: Family Medicine

## 2016-06-18 VITALS — BP 112/66 | HR 71 | Wt 158.0 lb

## 2016-06-18 DIAGNOSIS — S92514A Nondisplaced fracture of proximal phalanx of right lesser toe(s), initial encounter for closed fracture: Secondary | ICD-10-CM

## 2016-06-18 DIAGNOSIS — M25541 Pain in joints of right hand: Secondary | ICD-10-CM | POA: Diagnosis not present

## 2016-06-18 DIAGNOSIS — M7741 Metatarsalgia, right foot: Secondary | ICD-10-CM

## 2016-06-18 DIAGNOSIS — S6990XA Unspecified injury of unspecified wrist, hand and finger(s), initial encounter: Secondary | ICD-10-CM | POA: Diagnosis not present

## 2016-06-18 NOTE — Patient Instructions (Signed)
Thank you for coming in today. Use the pad in the shoe.   Return as needed.   Buddy Taping You have a minor finger or toe injury. It can be managed by buddy taping. Buddy taping means the injured finger or toe is taped to a healthy uninjured adjacent finger or toe. Most minor fractures and dislocations of the smaller fingers and toes will heal in 3 to 4 weeks. Buddy taping immobilizes and protects the area of injury. Buddy taping is not recommended for initial treatment of fractures of the thumb, longer fingers, or the great toe. Buddy taping should not be used for unstable or deformed fractures, but as fracture healing progresses it may be used for protection during rehabilitation. Fractured fingers and toes should be protected by buddy taping as long as the injury is still painful or swollen.  When an injury is buddy taped, place a small piece of gauze or cotton between the digits that are taped. This helps prevent the skin from breaking down from increased moisture. Buddy taping allows you to get your injury wet when you bathe. Change the gauze and tape more often if it gets wet, and dry the space between the finger or toes. Use a sturdy, hard-soled shoe for better support if you have a fractured toe. In 2 to 3 weeks you can start motion exercises. This will keep the fingers or toes from becoming stiff.  SEEK IMMEDIATE MEDICAL CARE IF:   The injured area becomes cold, numb, or pale.  You have pain not controlled with medications.  You notice increasing deformity of the toe or finger.   This information is not intended to replace advice given to you by your health care provider. Make sure you discuss any questions you have with your health care provider.   Document Released: 08/30/2004 Document Revised: 08/13/2014 Document Reviewed: 12/15/2014 Elsevier Interactive Patient Education [employer]2016 Elsevier Inc.
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
## 2016-07-26 ENCOUNTER — Other Ambulatory Visit: Payer: Self-pay | Admitting: *Deleted

## 2016-07-26 ENCOUNTER — Encounter: Payer: Self-pay | Admitting: Family Medicine

## 2016-07-26 ENCOUNTER — Other Ambulatory Visit: Payer: Self-pay | Admitting: Family Medicine

## 2016-07-26 MED ORDER — PREGABALIN 200 MG PO CAPS: ORAL_CAPSULE | ORAL | 0 refills | Status: DC

## 2016-07-31 ENCOUNTER — Encounter: Payer: Self-pay | Admitting: Family Medicine

## 2016-08-01 ENCOUNTER — Other Ambulatory Visit: Payer: Self-pay

## 2016-08-01 MED ORDER — ZOLPIDEM TARTRATE 10 MG PO TABS: 5.0000 mg | ORAL_TABLET | Freq: Every evening | ORAL | 0 refills | Status: DC | PRN

## 2016-08-02 ENCOUNTER — Encounter: Payer: Self-pay | Admitting: Family Medicine

## 2016-08-16 ENCOUNTER — Encounter: Payer: Self-pay | Admitting: Family Medicine

## 2016-08-16 ENCOUNTER — Ambulatory Visit (INDEPENDENT_AMBULATORY_CARE_PROVIDER_SITE_OTHER): Payer: BLUE CROSS/BLUE SHIELD | Admitting: Family Medicine

## 2016-08-16 VITALS — BP 123/72 | HR 82 | Ht 68.0 in | Wt 158.0 lb

## 2016-08-16 DIAGNOSIS — M542 Cervicalgia: Secondary | ICD-10-CM | POA: Diagnosis not present

## 2016-08-16 DIAGNOSIS — R51 Headache: Secondary | ICD-10-CM

## 2016-08-16 DIAGNOSIS — F5101 Primary insomnia: Secondary | ICD-10-CM | POA: Diagnosis not present

## 2016-08-16 DIAGNOSIS — R519 Headache, unspecified: Secondary | ICD-10-CM

## 2016-08-16 DIAGNOSIS — F4310 Post-traumatic stress disorder, unspecified: Secondary | ICD-10-CM

## 2016-08-16 MED ORDER — ZOLPIDEM TARTRATE 10 MG PO TABS: 5.0000 mg | ORAL_TABLET | Freq: Every evening | ORAL | 5 refills | Status: DC | PRN

## 2016-08-16 MED ORDER — TOPIRAMATE 25 MG PO TABS
25.0000 mg | ORAL_TABLET | Freq: Two times a day (BID) | ORAL | 2 refills | Status: DC
Start: 2016-08-16 — End: 2016-09-27

## 2016-08-16 MED ORDER — PREGABALIN 200 MG PO CAPS: ORAL_CAPSULE | ORAL | 5 refills | Status: DC

## 2016-08-16 MED ORDER — PREDNISONE 10 MG PO TABS
ORAL_TABLET | ORAL | 0 refills | Status: DC
Start: 2016-08-16 — End: 2016-09-04
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
## 2016-08-27 ENCOUNTER — Encounter (HOSPITAL_COMMUNITY): Payer: Self-pay | Admitting: Psychiatry

## 2016-08-27 ENCOUNTER — Ambulatory Visit (INDEPENDENT_AMBULATORY_CARE_PROVIDER_SITE_OTHER): Payer: BLUE CROSS/BLUE SHIELD | Admitting: Psychiatry

## 2016-08-27 VITALS — BP 120/80 | HR 80 | Wt 161.0 lb

## 2016-08-27 DIAGNOSIS — Z79899 Other long term (current) drug therapy: Secondary | ICD-10-CM

## 2016-08-27 DIAGNOSIS — Z888 Allergy status to other drugs, medicaments and biological substances status: Secondary | ICD-10-CM

## 2016-08-27 DIAGNOSIS — F411 Generalized anxiety disorder: Secondary | ICD-10-CM

## 2016-08-27 DIAGNOSIS — F5102 Adjustment insomnia: Secondary | ICD-10-CM | POA: Diagnosis not present

## 2016-08-27 DIAGNOSIS — Z813 Family history of other psychoactive substance abuse and dependence: Secondary | ICD-10-CM

## 2016-08-27 DIAGNOSIS — Z8249 Family history of ischemic heart disease and other diseases of the circulatory system: Secondary | ICD-10-CM

## 2016-08-27 DIAGNOSIS — Z818 Family history of other mental and behavioral disorders: Secondary | ICD-10-CM

## 2016-08-27 DIAGNOSIS — Z9889 Other specified postprocedural states: Secondary | ICD-10-CM | POA: Diagnosis not present

## 2016-08-27 DIAGNOSIS — Z87891 Personal history of nicotine dependence: Secondary | ICD-10-CM

## 2016-08-27 DIAGNOSIS — F4310 Post-traumatic stress disorder, unspecified: Secondary | ICD-10-CM | POA: Diagnosis not present

## 2016-08-27 MED ORDER — PAROXETINE HCL 20 MG PO TABS: 20.0000 mg | ORAL_TABLET | Freq: Every day | ORAL | 2 refills | Status: DC
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
## 2016-08-30 ENCOUNTER — Telehealth: Payer: Self-pay | Admitting: *Deleted

## 2016-08-30 NOTE — Telephone Encounter (Signed)
PA submitted for Lyrica [id number]

## 2016-08-31 NOTE — Telephone Encounter (Signed)
PA was denied because it states patient has not tried cymbalta. Patient has tried cymbalta. Appeal letter was written

## 2016-09-04 ENCOUNTER — Encounter: Payer: Self-pay | Admitting: Family Medicine

## 2016-09-04 ENCOUNTER — Ambulatory Visit (INDEPENDENT_AMBULATORY_CARE_PROVIDER_SITE_OTHER): Payer: BLUE CROSS/BLUE SHIELD | Admitting: Family Medicine

## 2016-09-04 VITALS — BP 139/87 | HR 77 | Ht 68.0 in | Wt 156.0 lb

## 2016-09-04 DIAGNOSIS — G8929 Other chronic pain: Secondary | ICD-10-CM

## 2016-09-04 DIAGNOSIS — M545 Low back pain: Secondary | ICD-10-CM

## 2016-09-04 NOTE — Progress Notes (Signed)
   Subjective:    Patient ID: Marko PlumeJason L Dennen, male    DOB: [birth year], 31 y.o.   MRN: [id number]  HPI 31 year old male is here today to follow-up on his medication for chronic back pain. He is currently on Lyrica and does really well with it. He feels like it controls his pain and has not caused any other side effects such as weight gain etc. Unfortunately with the new year history insurance coverage changed and they want him to have tried Cymbalta. He actually did try Cymbalta in 2016 and did not tolerate it well.  He says actually him is made him feel depressed. She did submit an appeal about 5 days ago. Plus he is Artie on Paxil.   Review of Systems     Objective:   Physical Exam  Constitutional: He is oriented to person, place, and time. He appears well-developed and well-nourished.  HENT:  Head: Normocephalic and atraumatic.  Eyes: Conjunctivae and EOM are normal.  Cardiovascular: Normal rate.   Pulmonary/Chest: Effort normal.  Neurological: He is alert and oriented to person, place, and time.  Skin: Skin is dry. No pallor.  Psychiatric: He has a normal mood and affect. His behavior is normal.  Vitals reviewed.         Assessment & Plan:  Chronic low back pain-he really does well with Lyrica. He went ahead and paid cash for the Lyrica this last time it was about $500 and he says he cannot continue to do that. Hopefully the a pill process will get approved as he has actually Artie tried Cymbalta back in 2016 and did not tolerate it well. We will check to see where the status of the appeal is to have her try to call today or tomorrow morning to get an up-to-date. + Concern dietary have to retry Cymbalta we will actually have to wean his Paxil first is he cannot be on 2 SSRI/SNRI.    Time spent 15 min, > 50% spent counseling about chronic low back pain.

## 2016-09-06 NOTE — Telephone Encounter (Signed)
lappeal approved from 08/30/17-08/05/2038.letter sent to scan,left message on  Pt vm and pharm vm

## 2016-09-27 ENCOUNTER — Ambulatory Visit (INDEPENDENT_AMBULATORY_CARE_PROVIDER_SITE_OTHER): Payer: BLUE CROSS/BLUE SHIELD | Admitting: Family Medicine

## 2016-09-27 ENCOUNTER — Encounter: Payer: Self-pay | Admitting: Family Medicine

## 2016-09-27 VITALS — BP 118/65 | HR 77 | Ht 68.0 in | Wt 155.0 lb

## 2016-09-27 DIAGNOSIS — R519 Headache, unspecified: Secondary | ICD-10-CM

## 2016-09-27 DIAGNOSIS — M7711 Lateral epicondylitis, right elbow: Secondary | ICD-10-CM

## 2016-09-27 DIAGNOSIS — R51 Headache: Secondary | ICD-10-CM | POA: Diagnosis not present

## 2016-09-27 NOTE — Patient Instructions (Signed)
Can try  Biofreeze 3 times a day on the right elbow. Try the tennis elbow strap. He can find is that most local pharmacies. This read the directions on how to apply it correctly.  Work on the stretches given.  If not improving over the next 3-4 weeks then come back in for possible injection.

## 2016-09-27 NOTE — Progress Notes (Signed)
   Subjective:    Patient ID: Raymond PlumeJason L [name], male    DOB: [birth year], 31 y.o.   MRN: [id number]  HPI 31 year old male here today to follow-up on chronic daily headaches. She says that they are about 75% better. He took the Topamax for about a week but says he admits made him feel more anxious so he is up stopping it. But he says his headaches overall are improved significantly. He quit laying back on his couch with his neck stretched this changed how he watches TV in bed and says that has actually made a big difference in his pain as he felt like a lot of his headaches were originating from his neck. He does have a headache today with a little bit of throbbing in his temples. He did take some ibuprofen but has really been trying to take any type of NSAIDs or Tylenol for the last 3-4 weeks.   He also complains of right elbow pain for approximately one month. He denies any known injury or trauma to the area but does do a lot of very heavy lifting at work for part of his job. He says it's painful even to lift a gallon of milk out of the refrigerator it's mostly focused over the outside of the right elbow. No worsening or alleviating factors and he has not tried any treatments for it.   Review of Systems     Objective:   Physical Exam  Constitutional: He is oriented to person, place, and time. He appears well-developed and well-nourished.  HENT:  Head: Normocephalic and atraumatic.  Eyes: Conjunctivae and EOM are normal.  Cardiovascular: Normal rate.   Pulmonary/Chest: Effort normal.  Musculoskeletal:  Tender directly over the right lateral epicondyles. Pain with dorsiflexion of the hand against resistance and pain with supination against resistance. Less discomfort with pronation. Strength is 5 out of 5 at the shoulder elbow and wrist and fingers.  Neurological: He is alert and oriented to person, place, and time.  Skin: Skin is dry. No pallor.  Psychiatric: He has a normal mood and affect. His  behavior is normal.  Vitals reviewed.         Assessment & Plan:  Chronic daily headache75% improvement just by changing position at home and not laying on the couch. Continue to work on making sure that he has his head in a good position also reminded him to make sure that he is doing his stretching exercises for his neck. He says he some a little bit but hasn't been very consistent with it. Next  Right lateral epicondylitis-discussed treatment including icing, topical anti-inflammatory. Right now he still has some Biofreeze at home so encouraged him to use that through the weekend and if it's not helping and we can: Topical Voltaren. Recommend a trial of an elbow strap and given handout on some stretches to on his own. If not improving over 34 weeks in follow-up for injection.

## 2016-10-07 ENCOUNTER — Encounter: Payer: Self-pay | Admitting: Emergency Medicine

## 2016-10-07 ENCOUNTER — Emergency Department
Admission: EM | Admit: 2016-10-07 | Discharge: 2016-10-07 | Disposition: A | Discharge status: Home / Self Care | Payer: BLUE CROSS/BLUE SHIELD | Source: Home / Self Care | Attending: Family Medicine | Admitting: Family Medicine

## 2016-10-07 DIAGNOSIS — L740 Miliaria rubra: Secondary | ICD-10-CM

## 2016-10-07 MED ORDER — PREDNISONE 20 MG PO TABS: 20.0000 mg | ORAL_TABLET | Freq: Two times a day (BID) | ORAL | 0 refills | Status: DC

## 2016-10-07 MED ORDER — DOXYCYCLINE HYCLATE 100 MG PO CAPS: 100.0000 mg | ORAL_CAPSULE | Freq: Two times a day (BID) | ORAL | 0 refills | Status: DC

## 2016-10-07 NOTE — Discharge Instructions (Signed)
May apply Anhydrous lanolin, which may help prevent duct blockage and stop new lesions from forming.  Recommend using cotton liners inside gloves, and change daily.

## 2016-10-07 NOTE — ED Provider Notes (Signed)
[facility]    CSN: [id number] Arrival date & time: 10/07/16  1136     History   Chief Complaint Chief Complaint  Patient presents with  . Rash    HPI Raymond [name] is a 31 y.o. male.   Patient complains of a pruritic rash in a glove-like distribution on her right hand for 6 days.  He states that he wears gloves daily at work, and his hands stay damp and sweaty inside the gloves.   The history is provided by the patient.  Rash  Location:  Hand Hand rash location:  R hand, dorsum of R hand, R palm and R fingers Quality: itchiness and redness   Quality: not blistering, not bruising, not burning, not draining, not painful, not peeling, not scaling, not swelling and not weeping   Severity:  Moderate Onset quality:  Gradual Duration:  6 days Timing:  Constant Progression:  Worsening Chronicity:  New Context: not animal contact, not chemical exposure, not exposure to similar rash, not food, not hot tub use, not insect bite/sting, not medications, not new detergent/soap, not nuts, not plant contact, not pollen, not sick contacts and not sun exposure   Relieved by:  Nothing Exacerbated by: wearing gloves. Ineffective treatments:  None tried Associated symptoms: no fever, no induration, no joint pain, no myalgias, no nausea, no sore throat and no throat swelling     Past Medical History:  Diagnosis Date  . Spontaneous pneumothorax    Two in the past 2011  . WEIGHT LOSS, ABNORMAL 09/30/2007   Qualifier: Diagnosis of  By: Thomos Lemons      Patient Active Problem List   Diagnosis Date Noted  . 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 PTSD (post-traumatic stress disorder) 03/15/2016  . Synovitis of hand 02/24/2016  . Right knee pain 01/26/2016  . Thoracic back pain 01/10/2016  . Abrasion 01/10/2016  . Low back pain 08/12/2014  . GAD (generalized anxiety disorder) 09/25/2013  . Insomnia 09/25/2013  . Paresthesia of both hands 09/25/2013  . History of  pneumothorax 04/07/2010  . TRANSIENT DISORDER INITIATING/MAINTAINING SLEEP 12/02/2007  . OTHER THALASSEMIA 09/30/2007  . WEIGHT LOSS, ABNORMAL 09/30/2007  . PAIN IN THORACIC SPINE 02/12/2007    Past Surgical History:  Procedure Laterality Date  . chest tubes    . INGUINAL HERNIA REPAIR Left 10/12/13       Home Medications    Prior to Admission medications   Medication Sig Start Date End Date Taking? Authorizing Provider  doxycycline (VIBRAMYCIN) 100 MG capsule Take 1 capsule (100 mg total) by mouth 2 (two) times daily. Take with food. 10/07/16   Lattie Haw, MD  PARoxetine (PAXIL) 20 MG tablet Take 1 tablet (20 mg total) by mouth daily. 08/27/16 08/27/17  Thresa Ross, MD  predniSONE (DELTASONE) 20 MG tablet Take 1 tablet (20 mg total) by mouth 2 (two) times daily. Take with food. 10/07/16   Lattie Haw, MD  pregabalin (LYRICA) 200 MG capsule take 1 capsule by mouth three times a day 08/16/16   Agapito Games, MD  zolpidem (AMBIEN) 10 MG tablet Take 0.5-1 tablets (5-10 mg total) by mouth at bedtime as needed for sleep. 08/16/16   Agapito Games, MD    Family History Family History  Problem Relation Age of Onset  . Hypertension Father   . Anxiety disorder Mother   . Depression Mother   . Drug abuse Brother   . Coronary artery disease Maternal Grandfather   .  ADD / ADHD Neg Hx   . Alcohol abuse Neg Hx   . Bipolar disorder Neg Hx   . Dementia Neg Hx   . OCD Neg Hx   . Paranoid behavior Neg Hx   . Schizophrenia Neg Hx   . Thyroid disease Neg Hx     Social History Social History  Substance Use Topics  . Smoking status: Former Smoker    Packs/day: 0.75    Types: E-cigarettes  . Smokeless tobacco: Never Used  . Alcohol use No     Comment: Stopped Drinking in December. Was drinking 2-3 beers a day.     Allergies   Baclofen; Cyclobenzaprine; and Prednisone   Review of Systems Review of Systems  Constitutional: Negative for fever.  HENT: Negative for  sore throat.   Gastrointestinal: Negative for nausea.  Musculoskeletal: Negative for arthralgias and myalgias.  Skin: Positive for rash.  All other systems reviewed and are negative.    Physical Exam Triage Vital Signs ED Triage Vitals  Enc Vitals Group     BP 10/07/16 1207 119/77     Pulse Rate 10/07/16 1207 75     Resp --      Temp 10/07/16 1207 98.2 F (36.8 C)     Temp Source 10/07/16 1207 Oral     SpO2 10/07/16 1207 97 %     Weight 10/07/16 1207 157 lb 4 oz (71.3 kg)     Height 10/07/16 1207 5\' 8"  (1.727 m)     Head Circumference --      Peak Flow --      Pain Score 10/07/16 1208 2     Pain Loc --      Pain Edu? --      Excl. in GC? --    No data found.   Updated Vital Signs BP 119/77 (BP Location: Left Arm)   Pulse 75   Temp 98.2 F (36.8 C) (Oral)   Ht 5\' 8"  (1.727 m)   Wt 157 lb 4 oz (71.3 kg)   SpO2 97%   BMI 23.91 kg/m   Visual Acuity Right Eye Distance:   Left Eye Distance:   Bilateral Distance:    Right Eye Near:   Left Eye Near:    Bilateral Near:     Physical Exam  Constitutional: He appears well-developed and well-nourished. No distress.  HENT:  Head: Normocephalic.  Nose: Nose normal.  Mouth/Throat: Oropharynx is clear and moist.  Eyes: Conjunctivae are normal. Pupils are equal, round, and reactive to light.  Neck: Neck supple.  Cardiovascular: Normal rate.   Pulmonary/Chest: Effort normal.  Abdominal: There is no tenderness.  Musculoskeletal:       Right hand: He exhibits normal range of motion, no tenderness and no swelling.       Hands: Right hand has multiple 2 to 4mm diameter slightly raised erythematous macules, some confluent, not centered on follicles.  No tenderness to palpation.   Lymphadenopathy:    He has no cervical adenopathy.  Neurological: He is alert.  Skin: Skin is warm and dry.  Nursing note and vitals reviewed.    UC Treatments / Results  Labs (all labs ordered are listed, but only abnormal results are  displayed) Labs Reviewed - No data to display  EKG  EKG Interpretation None       Radiology No results found.  Procedures Procedures (including critical care time)  Medications Ordered in UC Medications - No data to display   Initial Impression / Assessment  and Plan / UC Course  I have reviewed the triage vital signs and the nursing notes.  Pertinent labs & imaging results that were available during my care of the patient were reviewed by me and considered in my medical decision making (see chart for details).    Begin prednisone burst. Begin empiric doxycycline for possible staph. May apply Anhydrous lanolin, which may help prevent duct blockage and stop new lesions from forming.  Recommend using cotton liners inside gloves, and change daily. Followup with dermatologist if not improved two weeks.    Final Clinical Impressions(s) / UC Diagnoses   Final diagnoses:  Miliaria rubra    New Prescriptions New Prescriptions   DOXYCYCLINE (VIBRAMYCIN) 100 MG CAPSULE    Take 1 capsule (100 mg total) by mouth 2 (two) times daily. Take with food.   PREDNISONE (DELTASONE) 20 MG TABLET    Take 1 tablet (20 mg total) by mouth 2 (two) times daily. Take with food.     Lattie HawStephen A Kamarius Buckbee, MD 10/09/16 2106

## 2016-10-07 NOTE — ED Triage Notes (Signed)
Pt c/o rash on his right hand x 5 days only, he does wear latex gloves at work, itchy, painful at times.

## 2016-10-10 ENCOUNTER — Encounter: Payer: Self-pay | Admitting: Family Medicine

## 2016-10-10 ENCOUNTER — Ambulatory Visit (INDEPENDENT_AMBULATORY_CARE_PROVIDER_SITE_OTHER): Payer: BLUE CROSS/BLUE SHIELD | Admitting: Family Medicine

## 2016-10-10 VITALS — BP 121/62 | HR 94 | Ht 68.0 in | Wt 157.0 lb

## 2016-10-10 DIAGNOSIS — M7711 Lateral epicondylitis, right elbow: Secondary | ICD-10-CM

## 2016-10-10 NOTE — Progress Notes (Signed)
   Subjective:    Patient ID: Raymond PlumeJason L [name], male    DOB: 08/15/1985, 31 y.o.   MRN: [id number]  HPI F/U tennis elbow - Patient was seen about 3 weeks ago for right lateral epicondylitis. We discussed conservative treatment with icing, elbow strap anti-inflammatories etc. He decided to come in for an injection because it wasn't improving. And it got better for a few days but now feels worse again.  Review of Systems     Objective:   Physical Exam  Constitutional: He is oriented to person, place, and time. He appears well-developed and well-nourished.  HENT:  Head: Normocephalic and atraumatic.  Eyes: Conjunctivae and EOM are normal.  Cardiovascular: Normal rate.   Pulmonary/Chest: Effort normal.  Musculoskeletal:  Tender over the right lateral epicondyles on the right elbow. Strength 5 out of 5 at the elbow and wrist and hand. No swelling edema or erythema on exam.  Neurological: He is alert and oriented to person, place, and time.  Skin: Skin is dry. No pallor.  Psychiatric: He has a normal mood and affect. His behavior is normal.  Vitals reviewed.      Assessment & Plan:  Right lateral epicondylitis- Offered therapeutic injection. Patient agreed. Patient tolerated well. Follow-up wound care discussed. Return to see sports medicine if not improving over the next 3-4 weeks.  Aspiration/Injection Procedure Note Raymond PlumeJason L Coad [id number] 08/15/1985  Procedure: Injection Indications: pain and irritation  Procedure Details Consent: Risks of procedure as well as the alternatives and risks of each were explained to the (patient/caregiver).  Consent for procedure obtained. Time Out: Verified patient identification, verified procedure, site/side was marked, verified correct patient position, special equipment/implants available, medications/allergies/relevent history reviewed, required imaging and test results available.    Performed  Skin was prepped with alcohol.  1 mL of 1%  lidocaine without epinephrine and 1 mL of 40 mg Kenalog was injected over the lateral epicondyles. A sterile dressing was applied.  Patient did tolerate procedure well. Estimated blood loss: 0  Ayaan Ringle 10/10/2016, 12:37 PM

## 2016-10-31 ENCOUNTER — Encounter: Payer: Self-pay | Admitting: Family Medicine

## 2016-11-01 MED ORDER — PREDNISONE 20 MG PO TABS: 20.0000 mg | ORAL_TABLET | Freq: Two times a day (BID) | ORAL | 0 refills | Status: DC

## 2016-11-01 NOTE — Telephone Encounter (Signed)
Rx sent Pt advised 

## 2016-11-20 ENCOUNTER — Ambulatory Visit (INDEPENDENT_AMBULATORY_CARE_PROVIDER_SITE_OTHER): Payer: BLUE CROSS/BLUE SHIELD | Admitting: Psychiatry

## 2016-11-20 ENCOUNTER — Encounter (HOSPITAL_COMMUNITY): Payer: Self-pay | Admitting: Psychiatry

## 2016-11-20 VITALS — BP 122/70 | HR 84 | Resp 16 | Ht 68.0 in | Wt 159.0 lb

## 2016-11-20 DIAGNOSIS — F5102 Adjustment insomnia: Secondary | ICD-10-CM

## 2016-11-20 DIAGNOSIS — F4310 Post-traumatic stress disorder, unspecified: Secondary | ICD-10-CM | POA: Diagnosis not present

## 2016-11-20 DIAGNOSIS — Z818 Family history of other mental and behavioral disorders: Secondary | ICD-10-CM

## 2016-11-20 DIAGNOSIS — Z813 Family history of other psychoactive substance abuse and dependence: Secondary | ICD-10-CM

## 2016-11-20 DIAGNOSIS — Z87891 Personal history of nicotine dependence: Secondary | ICD-10-CM

## 2016-11-20 DIAGNOSIS — F411 Generalized anxiety disorder: Secondary | ICD-10-CM | POA: Diagnosis not present

## 2016-11-20 DIAGNOSIS — Z79899 Other long term (current) drug therapy: Secondary | ICD-10-CM | POA: Diagnosis not present

## 2016-11-20 MED ORDER — PAROXETINE HCL 20 MG PO TABS: 20.0000 mg | ORAL_TABLET | Freq: Every day | ORAL | 3 refills | Status: DC
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
## 2016-12-03 ENCOUNTER — Ambulatory Visit (INDEPENDENT_AMBULATORY_CARE_PROVIDER_SITE_OTHER): Payer: BLUE CROSS/BLUE SHIELD | Admitting: Family Medicine

## 2016-12-03 ENCOUNTER — Ambulatory Visit (INDEPENDENT_AMBULATORY_CARE_PROVIDER_SITE_OTHER): Payer: BLUE CROSS/BLUE SHIELD

## 2016-12-03 DIAGNOSIS — M79674 Pain in right toe(s): Secondary | ICD-10-CM | POA: Diagnosis not present

## 2016-12-03 DIAGNOSIS — M79671 Pain in right foot: Secondary | ICD-10-CM | POA: Diagnosis not present

## 2016-12-03 DIAGNOSIS — M542 Cervicalgia: Secondary | ICD-10-CM | POA: Insufficient documentation

## 2016-12-03 MED ORDER — CYCLOBENZAPRINE HCL 10 MG PO TABS: 10.0000 mg | ORAL_TABLET | Freq: Every day | ORAL | 0 refills | Status: DC

## 2016-12-03 MED ORDER — MELOXICAM 15 MG PO TABS: 15.0000 mg | ORAL_TABLET | Freq: Every day | ORAL | 0 refills | Status: DC
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
## 2016-12-03 NOTE — Patient Instructions (Addendum)
Thank you for coming in today. Attend PT.  Take meloxicam daily.  Use flexeril at bedtime as needed.  Use a heating pad.  Attend PT.  Recheck in 4-6 weeks.  Use voltaren gel on the toe joint.    Cervical Strain and Sprain Rehab Ask your health care provider which exercises are safe for you. Do exercises exactly as told by your health care provider and adjust them as directed. It is normal to feel mild stretching, pulling, tightness, or discomfort as you do these exercises, but you should stop right away if you feel sudden pain or your pain gets worse.Do not begin these exercises until told by your health care provider. Stretching and range of motion exercises These exercises warm up your muscles and joints and improve the movement and flexibility of your neck. These exercises also help to relieve pain, numbness, and tingling. Exercise A: Cervical side bend   1. Using good posture, sit on a stable chair or stand up. 2. Without moving your shoulders, slowly tilt your left / right ear to your shoulder until you feel a stretch in your neck muscles. You should be looking straight ahead. 3. Hold for __________ seconds. 4. Repeat with the other side of your neck. Repeat __________ times. Complete this exercise __________ times a day. Exercise B: Cervical rotation   1. Using good posture, sit on a stable chair or stand up. 2. Slowly turn your head to the side as if you are looking over your left / right shoulder.  Keep your eyes level with the ground.  Stop when you feel a stretch along the side and the back of your neck. 3. Hold for __________ seconds. 4. Repeat this by turning to your other side. Repeat __________ times. Complete this exercise __________ times a day. Exercise C: Thoracic extension and pectoral stretch  1. Roll a towel or a small blanket so it is about 4 inches (10 cm) in diameter. 2. Lie down on your back on a firm surface. 3. Put the towel lengthwise, under your spine  in the middle of your back. It should not be not under your shoulder blades. The towel should line up with your spine from your middle back to your lower back. 4. Put your hands behind your head and let your elbows fall out to your sides. 5. Hold for __________ seconds. Repeat __________ times. Complete this exercise __________ times a day. Strengthening exercises These exercises build strength and endurance in your neck. Endurance is the ability to use your muscles for a long time, even after your muscles get tired. Exercise D: Upper cervical flexion, isometric  1. Lie on your back with a thin pillow behind your head and a small rolled-up towel under your neck. 2. Gently tuck your chin toward your chest and nod your head down to look toward your feet. Do not lift your head off the pillow. 3. Hold for __________ seconds. 4. Release the tension slowly. Relax your neck muscles completely before you repeat this exercise. Repeat __________ times. Complete this exercise __________ times a day. Exercise E: Cervical extension, isometric   1. Stand about 6 inches (15 cm) away from a wall, with your back facing the wall. 2. Place a soft object, about 6-8 inches (15-20 cm) in diameter, between the back of your head and the wall. A soft object could be a small pillow, a ball, or a folded towel. 3. Gently tilt your head back and press into the soft object. Keep your  jaw and forehead relaxed. 4. Hold for __________ seconds. 5. Release the tension slowly. Relax your neck muscles completely before you repeat this exercise. Repeat __________ times. Complete this exercise __________ times a day. Posture and body mechanics   Body mechanics refers to the movements and positions of your body while you do your daily activities. Posture is part of body mechanics. Good posture and healthy body mechanics can help to relieve stress in your body's tissues and joints. Good posture means that your spine is in its natural  S-curve position (your spine is neutral), your shoulders are pulled back slightly, and your head is not tipped forward. The following are general guidelines for applying improved posture and body mechanics to your everyday activities. Standing   When standing, keep your spine neutral and keep your feet about hip-width apart. Keep a slight bend in your knees. Your ears, shoulders, and hips should line up.  When you do a task in which you stand in one place for a long time, place one foot up on a stable object that is 2-4 inches (5-10 cm) high, such as a footstool. This helps keep your spine neutral. Sitting    When sitting, keep your spine neutral and your keep feet flat on the floor. Use a footrest, if necessary, and keep your thighs parallel to the floor. Avoid rounding your shoulders, and avoid tilting your head forward.  When working at a desk or a computer, keep your desk at a height where your hands are slightly lower than your elbows. Slide your chair under your desk so you are close enough to maintain good posture.  When working at a computer, place your monitor at a height where you are looking straight ahead and you do not have to tilt your head forward or downward to look at the screen. Resting  When lying down and resting, avoid positions that are most painful for you. Try to support your neck in a neutral position. You can use a contour pillow or a small rolled-up towel. Your pillow should support your neck but not push on it. This information is not intended to replace advice given to you by your health care provider. Make sure you discuss any questions you have with your health care provider. Document Released: 07/23/2005 Document Revised: 03/29/2016 Document Reviewed: 06/29/2015 Elsevier Interactive Patient Education  2017 Else[employer]n.

## 2017-01-18 ENCOUNTER — Telehealth: Payer: Self-pay | Admitting: *Deleted

## 2017-01-18 NOTE — Telephone Encounter (Signed)
Called pt and informed him that form has been completed and placed up front for him to p/u. Copy placed in scan folder.Loralee PacasBarkley, [name]

## 2017-02-15 ENCOUNTER — Ambulatory Visit (INDEPENDENT_AMBULATORY_CARE_PROVIDER_SITE_OTHER): Payer: BLUE CROSS/BLUE SHIELD | Admitting: Family Medicine

## 2017-02-15 ENCOUNTER — Encounter: Payer: Self-pay | Admitting: Family Medicine

## 2017-02-15 VITALS — BP 138/83 | HR 87 | Ht 67.0 in | Wt 166.0 lb

## 2017-02-15 DIAGNOSIS — F5101 Primary insomnia: Secondary | ICD-10-CM

## 2017-02-15 DIAGNOSIS — F5102 Adjustment insomnia: Secondary | ICD-10-CM

## 2017-02-15 DIAGNOSIS — M542 Cervicalgia: Secondary | ICD-10-CM

## 2017-02-15 DIAGNOSIS — M25561 Pain in right knee: Secondary | ICD-10-CM

## 2017-02-15 DIAGNOSIS — M545 Low back pain: Secondary | ICD-10-CM | POA: Diagnosis not present

## 2017-02-15 DIAGNOSIS — G8929 Other chronic pain: Secondary | ICD-10-CM

## 2017-02-15 MED ORDER — PREGABALIN 200 MG PO CAPS: ORAL_CAPSULE | ORAL | 5 refills | Status: DC

## 2017-02-15 MED ORDER — ZOLPIDEM TARTRATE 10 MG PO TABS: 5.0000 mg | ORAL_TABLET | Freq: Every evening | ORAL | 3 refills | Status: DC | PRN

## 2017-02-15 NOTE — Progress Notes (Addendum)
Subjective:    Patient ID: Raymond PlumeJason L Huseby, male    DOB: [birth year], 31 y.o.   MRN: [id number]  HPI   Insomnia - Only taking Ambien 10 mg daily. Requesting a refill today. He is actually now working first shift so he goes into work around 4 in the morning and the neck she gets off around 2 or 3 in the afternoon. He still adjusting to this. So he would like to take the Ambien a little longer. He was previous on it for shift sleep work disorder  Follow-up neck pain-overall his pain is a little better than it was. He was not able to go to physical therapy because of the cost. He now has a new job and is actually working first shift so he has called to reschedule.   Chronic low back and cervical pain-he is no longer taking the Mobic or the Flexeril. He is still taking his Lyrica. He is requesting refills today.   History of right knee pain on and off but worse for about the last week and a half. He is noticing some occasional popping but feeling almost a shifting sensation in the knee when he goes to step outward. It seems to bother him more when he's been standing on his feet for a while. He says the knee feels tight almost like it's swollen but doesn't visually look swollen. Most of his pain is in a U-shaped around the outside the kneecap.     He would also like trying to wean down his Paxil. He's been having more frequent headaches. Is not sure if it's coming from his neck or if it's related to the Paxil so he would like to try decreasing his dose.  Review of Systems  BP 138/83   Pulse 87   Ht 5\' 7"  (1.702 m)   Wt 166 lb (75.3 kg)   BMI 26.00 kg/m     Allergies  Allergen Reactions  . Baclofen Nausea Only  . Cyclobenzaprine Other (See Comments)    Dry mouth  . Prednisone Other (See Comments)    Extremely Irritable.     Past Medical History:  Diagnosis Date  . Spontaneous pneumothorax    Two in the past 2011  . WEIGHT LOSS, ABNORMAL 09/30/2007   Qualifier: Diagnosis of  By:  Thomos LemonsBowen DO, Karen      Past Surgical History:  Procedure Laterality Date  . chest tubes    . INGUINAL HERNIA REPAIR Left 10/12/13    Social History   Social History  . Marital status: Single    Spouse name: N/A  . Number of children: N/A  . Years of education: N/A   Occupational History  . Work at [employer]    Social History Main Topics  . Smoking status: Former Smoker    Packs/day: 0.75    Types: E-cigarettes  . Smokeless tobacco: Never Used  . Alcohol use No     Comment: Stopped Drinking in December. Was drinking 2-3 beers a day.  . Drug use: No     Comment: Synthetic Marijuana- Stopped Jan. 6th Caffeine: Coffee 2 cups/pots per day.   Marland Kitchen. Sexual activity: Yes    Partners: Female     Comment: No birth control   Other Topics Concern  . Not on file   Social History Narrative  . No narrative on file    Family History  Problem Relation Age of Onset  . Hypertension Father   . Anxiety disorder Mother   .  Depression Mother   . Drug abuse Brother   . Coronary artery disease Maternal Grandfather   . ADD / ADHD Neg Hx   . Alcohol abuse Neg Hx   . Bipolar disorder Neg Hx   . Dementia Neg Hx   . OCD Neg Hx   . Paranoid behavior Neg Hx   . Schizophrenia Neg Hx   . Thyroid disease Neg Hx     Outpatient Encounter Prescriptions as of 02/15/2017  Medication Sig  . PARoxetine (PAXIL) 20 MG tablet Take 1 tablet (20 mg total) by mouth daily.  . pregabalin (LYRICA) 200 MG capsule take 1 capsule by mouth three times a day  . zolpidem (AMBIEN) 10 MG tablet Take 0.5-1 tablets (5-10 mg total) by mouth at bedtime as needed for sleep.  . [DISCONTINUED] pregabalin (LYRICA) 200 MG capsule take 1 capsule by mouth three times a day  . [DISCONTINUED] zolpidem (AMBIEN) 10 MG tablet Take 0.5-1 tablets (5-10 mg total) by mouth at bedtime as needed for sleep.  . [DISCONTINUED] cyclobenzaprine (FLEXERIL) 10 MG tablet Take 1 tablet (10 mg total) by mouth at bedtime.  . [DISCONTINUED] meloxicam  (MOBIC) 15 MG tablet Take 1 tablet (15 mg total) by mouth daily.   No facility-administered encounter medications on file as of 02/15/2017.           Objective:   Physical Exam  Constitutional: He is oriented to person, place, and time. He appears well-developed and well-nourished.  HENT:  Head: Normocephalic and atraumatic.  Eyes: Conjunctivae and EOM are normal.  Cardiovascular: Normal rate.   Pulmonary/Chest: Effort normal.  Musculoskeletal:  Right knee with normal flexion and extension. Strength at the knee and ankle is 5 out of 5. Nontender around the knee joint or along the joint lines. NO increased laxity with anterior drawer. No hypermobility of the patella. That he does have a little bit of a clicking when I just to passive extension of the knee. Negative McMurray's.  Neurological: He is alert and oriented to person, place, and time.  Skin: Skin is dry. No pallor.  Psychiatric: He has a normal mood and affect. His behavior is normal.  Vitals reviewed.         Assessment & Plan:  Insomnia-we'll refill Ambien.  Cervical pain-he had she starts physical therapy next week. Hopefully this will help. He has weaned himself off the muscle relaxer and anti-inflammatory.  Chronic low back pain and cervical pain-we'll refill his Lyrica for 6 more months. He is doing well on his current regimen.  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 we'll add order for physical therapy to his current physical therapy session that starts next week.  Frequent headaches-I suspect a lot of this is probably due to his neck issues but I do think it's reasonable to maybe try decreasing his Paxil to 10 mg. He glucose tabs in half and try this for a month and see if it helps.

## 2017-02-15 NOTE — Patient Instructions (Signed)
Cut paxil in half and take half a tab daily for 1 month. If doing well and want to wean further then just call the office.

## 2017-02-22 ENCOUNTER — Encounter: Payer: Self-pay | Admitting: Family Medicine

## 2017-02-22 DIAGNOSIS — N50819 Testicular pain, unspecified: Secondary | ICD-10-CM

## 2017-02-27 ENCOUNTER — Ambulatory Visit (INDEPENDENT_AMBULATORY_CARE_PROVIDER_SITE_OTHER): Payer: BLUE CROSS/BLUE SHIELD | Admitting: Rehabilitative and Restorative Service Providers"

## 2017-02-27 ENCOUNTER — Encounter: Payer: Self-pay | Admitting: Rehabilitative and Restorative Service Providers"

## 2017-02-27 DIAGNOSIS — R531 Weakness: Secondary | ICD-10-CM

## 2017-02-27 DIAGNOSIS — M25561 Pain in right knee: Secondary | ICD-10-CM | POA: Diagnosis not present

## 2017-02-27 DIAGNOSIS — R269 Unspecified abnormalities of gait and mobility: Secondary | ICD-10-CM | POA: Diagnosis not present

## 2017-02-27 DIAGNOSIS — R29898 Other symptoms and signs involving the musculoskeletal system: Secondary | ICD-10-CM | POA: Diagnosis not present

## 2017-02-27 DIAGNOSIS — Z7409 Other reduced mobility: Secondary | ICD-10-CM

## 2017-02-27 NOTE — Patient Instructions (Addendum)
HIP: Hamstrings - Supine   Place strap around foot. Raise leg up, keeping knee straight.  Bend opposite knee to protect back if indicated. Hold 30 seconds. 3 reps per set, 2-3 sets per day  Straight Leg Raise: With External Leg Rotation    Lie on back with right leg straight, opposite leg bent. Rotate straight leg out and lift __10__ inches. Repeat _10___ times per set. Do __1-3__ sets per session. Do __2__ sessions per day.   Strengthening: Hip Adduction - Isometric    With ball or folded pillow between knees, squeeze knees together. Hold __10__ seconds. Repeat __10__ times per set. Do __1-3__ sets per session. Do __2__ sessions per day.   Balance: Unilateral    Attempt to balance on left leg, eyes open. Hold _20-30___ seconds. Repeat _3-5___ times per set. Do __4-5__ sets per session. Do __2-3__ sessions per day. Perform exercise with eyes closed.   Kinesiology tape What is kinesiology tape?  There are many brands of kinesiology tape.  KTape, [employer]ape, [employer] Sport, Dynamic tape, to name a few. It is an elasticized tape designed to support the body's natural healing process. This tape provides stability and support to muscles and joints without restricting motion. It can also help decrease swelling in the area of application. How does it work? The tape microscopically lifts and decompresses the skin to allow for drainage of lymph (swelling) to flow away from area, reducing inflammation.  The tape has the ability to help re-educate the neuromuscular system by targeting specific receptors in the skin.  The presence of the tape increases the body's awareness of posture and body mechanics.  Contraindications: . Open wounds . Skin lesions . Adhesive allergies Safe removal of the tape: In some rare cases, mild/moderate skin irritation can occur.  This can include redness, itchiness, or hives. If this occurs, immediately remove tape and consult your primary care physician if  symptoms are severe or do not resolve within 2 days.  To remove tape safely, hold nearby skin with one hand and gentle roll tape down with other hand.  You can apply oil or conditioner to tape while in shower prior to removal to loosen adhesive.  DO NOT swiftly rip tape off like a band-aid, as this could cause skin tears and additional skin irritation.
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
## 2017-03-07 ENCOUNTER — Ambulatory Visit (INDEPENDENT_AMBULATORY_CARE_PROVIDER_SITE_OTHER): Payer: BLUE CROSS/BLUE SHIELD | Admitting: Physical Therapy

## 2017-03-07 DIAGNOSIS — R531 Weakness: Secondary | ICD-10-CM

## 2017-03-07 DIAGNOSIS — R29898 Other symptoms and signs involving the musculoskeletal system: Secondary | ICD-10-CM | POA: Diagnosis not present

## 2017-03-07 DIAGNOSIS — M25561 Pain in right knee: Secondary | ICD-10-CM
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
## 2017-03-11 ENCOUNTER — Ambulatory Visit (INDEPENDENT_AMBULATORY_CARE_PROVIDER_SITE_OTHER): Payer: BLUE CROSS/BLUE SHIELD | Admitting: Rehabilitative and Restorative Service Providers"

## 2017-03-11 ENCOUNTER — Encounter: Payer: Self-pay | Admitting: Rehabilitative and Restorative Service Providers"

## 2017-03-11 DIAGNOSIS — R29898 Other symptoms and signs involving the musculoskeletal system: Secondary | ICD-10-CM | POA: Diagnosis not present

## 2017-03-11 DIAGNOSIS — M542 Cervicalgia: Secondary | ICD-10-CM

## 2017-03-11 DIAGNOSIS — M25561 Pain in right knee: Secondary | ICD-10-CM

## 2017-03-11 DIAGNOSIS — R531 Weakness: Secondary | ICD-10-CM
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
## 2017-03-11 NOTE — Patient Instructions (Signed)

## 2017-03-13 ENCOUNTER — Ambulatory Visit (HOSPITAL_COMMUNITY): Payer: BLUE CROSS/BLUE SHIELD | Admitting: Psychiatry

## 2017-03-13 ENCOUNTER — Ambulatory Visit (INDEPENDENT_AMBULATORY_CARE_PROVIDER_SITE_OTHER): Payer: BLUE CROSS/BLUE SHIELD | Admitting: Rehabilitative and Restorative Service Providers"

## 2017-03-13 DIAGNOSIS — M25561 Pain in right knee: Secondary | ICD-10-CM

## 2017-03-13 DIAGNOSIS — R29898 Other symptoms and signs involving the musculoskeletal system: Secondary | ICD-10-CM

## 2017-03-13 DIAGNOSIS — R531 Weakness: Secondary | ICD-10-CM

## 2017-03-13 DIAGNOSIS — M542 Cervicalgia: Secondary | ICD-10-CM

## 2017-03-14 ENCOUNTER — Encounter: Payer: Self-pay | Admitting: Rehabilitative and Restorative Service Providers"

## 2017-03-14 NOTE — Therapy (Signed)
[facility] [place] [address] [place], [place], [address] Phone: [phone]   Fax:  [phone]  Physical Therapy Treatment  Patient Details  Name: Raymond [name] MRN: [id number] Date of Birth: [birth year] Referring Provider: Dr Metheney/Dr Corey(neck)   Encounter Date: 03/13/2017      PT End of Session - 03/14/17 1209    Visit Number 4   Number of Visits 12   Date for PT Re-Evaluation 04/10/17   PT Start Time 1551   PT Stop Time 1649   PT Time Calculation (min) 58 min   Activity Tolerance Patient tolerated treatment well      Past Medical History:  Diagnosis Date  . Spontaneous pneumothorax    Two in the past 2011  . WEIGHT LOSS, ABNORMAL 09/30/2007   Qualifier: Diagnosis of  By: Thomos Lemons      Past Surgical History:  Procedure Laterality Date  . chest tubes    . INGUINAL HERNIA REPAIR Left 10/12/13    There were no vitals filed for this visit.      Subjective Assessment - 03/14/17 1145    Subjective Raymond [name] reports that his neck is sore but he has not had a headache yesterday or today. Feels the DN helped but does not want to try again today. Knee continues to hurt - he has pain when he is standing and walking He feels a "squish and a pop" above and below the knee with walking. Knee symptoms have not changed since he statrted therapy. Raymond [name] states that he is exerciseing all day at work. He does some exercises at night and uses ice for knee. Concerned about knee. He has not had any tests done on his knee. (xray/MRI)    Pain Score 5    Pain Location Knee   Pain Orientation Right   Pain Descriptors / Indicators Dull;Nagging  squish and pop    Pain Type Acute pain   Pain Onset More than a month ago   Pain Frequency Constant   Pain Score 3   Pain Location Neck   Pain Orientation Right   Pain Descriptors / Indicators Tightness;Dull   Pain Type Chronic pain   Pain Onset More than a month ago   Pain Frequency  Constant                         [facility] Adult PT Treatment/Exercise - 03/14/17 0001      Knee/Hip Exercises: Stretches   Passive Hamstring Stretch Right;3 reps;30 seconds  supine with strap    Quad Stretch Right;3 reps;30 seconds  prone - foam roll distal thigh - with strap      Knee/Hip Exercises: Aerobic   Recumbent Bike L2: 5 min      Knee/Hip Exercises: Standing   Heel Raises Both;10 reps   SLS Rt/Lt SLS on blue pad x 30 sec x 2 reps each   SLS with Vectors SLS Rt LE reaching fwd to touch chair seat 10 reps x 3 sets      Knee/Hip Exercises: Supine   Quad Sets Strengthening;Right;10 reps   Short Arc Quad Sets Strengthening;Right;10 reps  LE supported on green  bolster     Straight Leg Raise with External Rotation Strengthening;Right;10 reps   Patellar Mobs moving patella medially and distally for stretch of lateral structures      Knee/Hip Exercises: Sidelying   Hip ABduction Right;10 reps  tap forward/tap back x 3  Moist Heat Therapy   Number Minutes Moist Heat 15 Minutes   Moist Heat Location Cervical     Electrical Stimulation   Electrical Stimulation Location bilat cervical   Electrical Stimulation Action IFC   Electrical Stimulation Parameters to tolerance   Electrical Stimulation Goals Pain;Tone     Ultrasound   Ultrasound Location peripatellar tissue focus on lateral proximal patella area and insertion of quad tendon    Ultrasound Parameters 1.4 w/cm2; 1 mHz; 100%; 9 min    Ultrasound Goals Pain;Other (Comment)  tightness      Manual Therapy   Manual therapy comments pt supine Rt LE resting on bolster    Joint Mobilization patellar mobs all plan   Soft tissue mobilization tool assisted soft tissue work peripatella                PT Education - 03/14/17 1204    Education provided Yes   Education Details HEP (unable to send written HEP - EPIC down)   Person(s) Educated Patient   Methods Explanation;Demonstration;Verbal cues    Comprehension Verbalized understanding;Returned demonstration;Verbal cues required             PT Long Term Goals - 03/11/17 1601      PT LONG TERM GOAL #1   Title Patient I in HEP for discharge 04/10/17   Time 6   Period Weeks   Status On-going     PT LONG TERM GOAL #2   Title Full painfree ROM Rt knee 04/10/17   Time 6   Period Weeks   Status On-going     PT LONG TERM GOAL #3   Title 5/5 strength Rt knee and ankle - no pain with resistive testing Rt LE  04/10/17   Time 6   Period Weeks   Status On-going     PT LONG TERM GOAL #4   Title Patient to tolerate sitting, standing and walking per his previous status 04/10/17   Time 6   Period Weeks   Status On-going     PT LONG TERM GOAL #5   Title Improve FOTO to </= 35 % limitation 04/10/17   Time 6   Period Weeks   Status On-going     PT LONG TERM GOAL #6   Title improve cervical ROM and mobility by 5-8 deg in flexion; lateral flexion and rotation 04/10/17   Time 6   Period Weeks   Status New     PT LONG TERM GOAL #7   Title Decrease frequency; intemsity and duration of daily headaches and neck pain by 50-75%  04/10/17   Time 6   Period Weeks   Status New               Plan - 03/14/17 1210    Clinical Impression Statement Raymond [name] continues to report Rt knee pain which is unchanged since beginning therapy. He seems inconsistent with HEP. He does have palpable tightness through the peripatellar area Rt knee with tightness noted through the lateral quad insertion and patella tendon. Trial of US to peripatella area. Encouragged consistent HEP and postural correction.    Rehab Potential Good   PT Frequency 2x / week   PT Duration 6 weeks   PT Treatment/Interventions Patient/family education;ADLs/Self Care Home Management;Cryotherapy;Electrical Stimulation;Iontophoresis 4mg /ml Dexamethasone;Moist Heat;Ultrasound;Dry needling;Manual techniques;Therapeutic activities;Therapeutic exercise;Balance training;Neuromuscular  re-education   PT Next Visit Plan Continue Rock tape as indicated.  progress LE strengthening /flexibility. Address cervical spine with postural correction; stretching; strengthening; manual work; DN; modalities  Consulted and Agree with Plan of Care Patient      Patient will benefit from skilled therapeutic intervention in order to improve the following deficits and impairments:  Postural dysfunction, Improper body mechanics, Pain, Decreased strength, Abnormal gait, Decreased activity tolerance, Increased fascial restricitons, Increased muscle spasms  Visit Diagnosis: Acute pain of right knee  Other symptoms and signs involving the musculoskeletal system  Weakness generalized  Cervicalgia     Problem List Patient Active Problem List   Diagnosis Date Noted  . Right foot pain 12/03/2016  . Pain, neck 12/03/2016  . 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 PTSD (post-traumatic stress disorder) 03/15/2016  . Synovitis of hand 02/24/2016  . Right knee pain 01/26/2016  . Low back pain 08/12/2014  . GAD (generalized anxiety disorder) 09/25/2013  . Insomnia 09/25/2013  . Paresthesia of both hands 09/25/2013  . History of pneumothorax 04/07/2010  . TRANSIENT DISORDER INITIATING/MAINTAINING SLEEP 12/02/2007  . OTHER THALASSEMIA 09/30/2007  . PAIN IN THORACIC SPINE 02/12/2007    Treatment provided 03/13/17. Documentation completed 03/14/17 due to system wide EPIC shut down.   Raymond [name] 03/14/2017, 12:17 PM  [facility] [address] [place], [place], [address] Phone: [phone]   Fax:  [phone]  Name: Raymond [name] MRN: [id number] Date of Birth: May 06, 1986

## 2017-03-19 ENCOUNTER — Ambulatory Visit (INDEPENDENT_AMBULATORY_CARE_PROVIDER_SITE_OTHER): Payer: BLUE CROSS/BLUE SHIELD | Admitting: Psychiatry

## 2017-03-19 ENCOUNTER — Encounter (HOSPITAL_COMMUNITY): Payer: Self-pay | Admitting: Psychiatry

## 2017-03-19 VITALS — BP 118/72 | HR 86 | Resp 16 | Ht 67.0 in | Wt 167.6 lb

## 2017-03-19 DIAGNOSIS — Z818 Family history of other mental and behavioral disorders: Secondary | ICD-10-CM | POA: Diagnosis not present

## 2017-03-19 DIAGNOSIS — F4310 Post-traumatic stress disorder, unspecified: Secondary | ICD-10-CM

## 2017-03-19 DIAGNOSIS — Z87891 Personal history of nicotine dependence: Secondary | ICD-10-CM | POA: Diagnosis not present

## 2017-03-19 DIAGNOSIS — F5102 Adjustment insomnia: Secondary | ICD-10-CM

## 2017-03-19 DIAGNOSIS — Z813 Family history of other psychoactive substance abuse and dependence: Secondary | ICD-10-CM | POA: Diagnosis not present

## 2017-03-19 DIAGNOSIS — F411 Generalized anxiety disorder: Secondary | ICD-10-CM

## 2017-03-19 MED ORDER — PAROXETINE HCL 20 MG PO TABS: 20.0000 mg | ORAL_TABLET | Freq: Every day | ORAL | 4 refills | Status: DC
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
## 2017-04-01 ENCOUNTER — Encounter: Payer: Self-pay | Admitting: Family Medicine

## 2017-04-01 ENCOUNTER — Ambulatory Visit (INDEPENDENT_AMBULATORY_CARE_PROVIDER_SITE_OTHER): Payer: BLUE CROSS/BLUE SHIELD | Admitting: Physical Therapy

## 2017-04-01 DIAGNOSIS — R531 Weakness: Secondary | ICD-10-CM | POA: Diagnosis not present

## 2017-04-01 DIAGNOSIS — M542 Cervicalgia: Secondary | ICD-10-CM | POA: Diagnosis not present

## 2017-04-01 DIAGNOSIS — R29898 Other symptoms and signs involving the musculoskeletal system: Secondary | ICD-10-CM

## 2017-04-01 DIAGNOSIS — Z7409 Other reduced mobility: Secondary | ICD-10-CM | POA: Diagnosis not present

## 2017-04-01 DIAGNOSIS — M25561 Pain in right knee: Secondary | ICD-10-CM

## 2017-04-01 NOTE — Therapy (Signed)
[facility] [place], [place], [address] Phone: [phone]   Fax:  [phone]  Physical Therapy Treatment  Patient Details  Name: Raymond [name] MRN: [id number] Date of Birth: 1986-05-05 Referring Provider: Dr Metheney/Dr Corey(neck)   Encounter Date: 04/01/2017      PT End of Session - 04/01/17 1650    Visit Number 5   Number of Visits 12   Date for PT Re-Evaluation 04/10/17   PT Start Time 7867  pt arrived late   PT Stop Time 1648   PT Time Calculation (min) 39 min   Activity Tolerance Patient tolerated treatment well;No increased pain   Behavior During Therapy WFL for tasks assessed/performed      Past Medical History:  Diagnosis Date  . Spontaneous pneumothorax    Two in the past 2011  . WEIGHT LOSS, ABNORMAL 09/30/2007   Qualifier: Diagnosis of  By: Esmeralda Arthur      Past Surgical History:  Procedure Laterality Date  . chest tubes    . INGUINAL HERNIA REPAIR Left 10/12/13    There were no vitals filed for this visit.      Subjective Assessment - 04/01/17 1611    Subjective Pt arrived late due to work schedule.  His chief complaint is the pain in neck.  He states his knee, "is a lost cause"; he is interested in going to orthopedic specialist to have it further evaluated. His Rt knee frequently locks and catches.  His neck is worse in AM, gets better with hot shower and movement.     Patient Stated Goals improve knee pain and improve ROM without pain    Currently in Pain? Yes   Pain Score 4    Pain Location Neck   Pain Orientation Right;Left   Pain Descriptors / Indicators Dull;Aching   Aggravating Factors  laying down   Pain Relieving Factors changes positions, medicine, hot shower            [facility] PT Assessment - 04/01/17 0001      AROM   Cervical Flexion 62   Cervical Extension 57   Cervical - Right Side Bend 40   Cervical - Left Side Bend 45   Cervical - Right Rotation 57   65 at end of session   Cervical - Left Rotation 55  62 at end of session.            [facility] Adult PT Treatment/Exercise - 04/01/17 0001      Neck Exercises: Machines for Strengthening   UBE (Upper Arm Bike) L2: 1.5 min forward/backward     Neck Exercises: Seated   Neck Retraction 5 reps   Cervical Rotation Right;Left;10 reps     Modalities   Modalities Electrical Stimulation;Ultrasound     Electrical Stimulation   Electrical Stimulation Location Rt post/lateral cervical musculature   Electrical Stimulation Action combo [place]   Electrical Stimulation Parameters to tolerance   Electrical Stimulation Goals Pain;Tone     Ultrasound   Ultrasound Location see estim location   Ultrasound Parameters combo [place]; 1.2w/cm2, 100%, 8 min   Ultrasound Goals Pain;Other (Comment)  tightness      Manual Therapy   Soft tissue mobilization Edge tool assisted STM to Rt/Lt cervical musculature, along with upper trap, levator - to decrease fascial restrictions and pain. STM to suboccipitals; deep pressure through Rt upper trap.      Neck Exercises: Stretches   Upper Trapezius Stretch 2 reps;30 seconds  Levator Stretch 2 reps;30 seconds   Other Neck Stretches 3 position doorway stretch x 2 reps, 30 sec each position - VC for technique.                      PT Long Term Goals - 04/01/17 1652      PT LONG TERM GOAL #1   Title Patient I in HEP for discharge 04/10/17   Time 6   Period Weeks   Status On-going     PT LONG TERM GOAL #2   Title Full painfree ROM Rt knee 04/10/17   Time 6   Period Weeks   Status Partially Met  knee range good, pain still present.      PT LONG TERM GOAL #3   Title 5/5 strength Rt knee and ankle - no pain with resistive testing Rt LE  04/10/17   Time 6   Period Weeks   Status On-going     PT LONG TERM GOAL #4   Title Patient to tolerate sitting, standing and walking per his previous status 04/10/17   Time 6   Period Weeks   Status On-going     PT  LONG TERM GOAL #5   Title Improve FOTO to </= 35 % limitation 04/10/17   Time 6   Period Weeks   Status On-going     PT LONG TERM GOAL #6   Title improve cervical ROM and mobility by 5-8 deg in flexion; lateral flexion and rotation 04/10/17   Time 6   Period Weeks   Status Partially Met     PT LONG TERM GOAL #7   Title Decrease frequency; intensity and duration of daily headaches and neck pain by 50-75%  04/10/17   Time 6   Period Weeks   Status On-going  unchanged.                Plan - 04/01/17 1650    Clinical Impression Statement Pt demonstrated improved cervical ROM today; even further improvement in rotation after manual therapy/combo [place].  Pt reported 2-3 point reduction in pain by end of session.  Progressing wiht neck related goals; knee goals unchanged.    Rehab Potential Good   PT Frequency 2x / week   PT Duration 6 weeks   PT Treatment/Interventions Patient/family education;ADLs/Self Care Home Management;Cryotherapy;Electrical Stimulation;Iontophoresis 4m/ml Dexamethasone;Moist Heat;Ultrasound;Dry needling;Manual techniques;Therapeutic activities;Therapeutic exercise;Balance training;Neuromuscular re-education   PT Next Visit Plan Progress postural correction/strengthening.  Manual therapy/ combo to neck.    Consulted and Agree with Plan of Care Patient      Patient will benefit from skilled therapeutic intervention in order to improve the following deficits and impairments:  Postural dysfunction, Improper body mechanics, Pain, Decreased strength, Abnormal gait, Decreased activity tolerance, Increased fascial restricitons, Increased muscle spasms  Visit Diagnosis: Cervicalgia  Impaired functional mobility and endurance  Weakness generalized  Other symptoms and signs involving the musculoskeletal system     Problem List Patient Active Problem List   Diagnosis Date Noted  . Right foot pain 12/03/2016  . Pain, neck 12/03/2016  . 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 PTSD (post-traumatic stress disorder) 03/15/2016  . Synovitis of hand 02/24/2016  . Right knee pain 01/26/2016  . Low back pain 08/12/2014  . GAD (generalized anxiety disorder) 09/25/2013  . Insomnia 09/25/2013  . Paresthesia of both hands 09/25/2013  . History of pneumothorax 04/07/2010  . TRANSIENT DISORDER INITIATING/MAINTAINING SLEEP 12/02/2007  . OTHER THALASSEMIA 09/30/2007  . PAIN  IN THORACIC SPINE 02/12/2007   Kerin Perna, PTA 04/01/17 5:04 PM  [facility] [place] [address] [place], [place], [address] Phone: [phone]   Fax:  [phone]  Name: Raymond [name] MRN: [id number] Date of Birth: 1985/09/18

## 2017-04-04 ENCOUNTER — Ambulatory Visit (INDEPENDENT_AMBULATORY_CARE_PROVIDER_SITE_OTHER): Payer: BLUE CROSS/BLUE SHIELD | Admitting: Physical Therapy

## 2017-04-04 DIAGNOSIS — Z7409 Other reduced mobility: Secondary | ICD-10-CM | POA: Diagnosis not present

## 2017-04-04 DIAGNOSIS — R531 Weakness: Secondary | ICD-10-CM | POA: Diagnosis not present

## 2017-04-04 DIAGNOSIS — M25561 Pain in right knee: Secondary | ICD-10-CM | POA: Diagnosis not present

## 2017-04-04 DIAGNOSIS — M542 Cervicalgia: Secondary | ICD-10-CM | POA: Diagnosis not present

## 2017-04-04 DIAGNOSIS — R29898 Other symptoms and signs involving the musculoskeletal system: Secondary | ICD-10-CM
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
## 2017-04-11 ENCOUNTER — Ambulatory Visit (INDEPENDENT_AMBULATORY_CARE_PROVIDER_SITE_OTHER): Payer: BLUE CROSS/BLUE SHIELD | Admitting: Physical Therapy

## 2017-04-11 DIAGNOSIS — R29898 Other symptoms and signs involving the musculoskeletal system: Secondary | ICD-10-CM

## 2017-04-11 DIAGNOSIS — M25561 Pain in right knee: Secondary | ICD-10-CM

## 2017-04-11 DIAGNOSIS — M542 Cervicalgia: Secondary | ICD-10-CM

## 2017-04-11 DIAGNOSIS — Z7409 Other reduced mobility: Secondary | ICD-10-CM

## 2017-04-11 DIAGNOSIS — R531 Weakness: Secondary | ICD-10-CM
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
## 2017-04-22 NOTE — Progress Notes (Deleted)
   Subjective:    Patient ID: Raymond [name], male    DOB: 1986/03/19, 31 y.o.   MRN: [id number]  HPI    Review of Systems     Objective:   Physical Exam        Assessment & Plan:

## 2017-04-23 ENCOUNTER — Ambulatory Visit: Payer: Self-pay | Admitting: Family Medicine

## 2017-05-03 ENCOUNTER — Ambulatory Visit (INDEPENDENT_AMBULATORY_CARE_PROVIDER_SITE_OTHER): Payer: BLUE CROSS/BLUE SHIELD | Admitting: Family Medicine

## 2017-05-03 ENCOUNTER — Encounter: Payer: Self-pay | Admitting: Family Medicine

## 2017-05-03 VITALS — BP 122/77 | HR 81 | Ht 67.0 in | Wt 165.0 lb

## 2017-05-03 DIAGNOSIS — M7711 Lateral epicondylitis, right elbow: Secondary | ICD-10-CM

## 2017-05-03 MED ORDER — MELOXICAM 15 MG PO TABS: 15.0000 mg | ORAL_TABLET | Freq: Every day | ORAL | 2 refills | Status: DC

## 2017-05-03 NOTE — Progress Notes (Signed)
   Subjective:    Patient ID: Raymond [name], male    DOB: 05/24/1986, 31 y.o.   MRN: [id number]  HPI 31 yo male Comes in complaining of left tennis elbow. It has bothered him previously and in fact has had an injection done previously about 6 months ago in March in the same arm. He did well with the injection and got significant relief. He's been working overtime and does a physically strenuous job. It's been gradually getting worse over the last couple of months. He's been using ibuprofen mostly for pain control. He did have some Mobic left over from last spring and used a couple of tabs of that as well. He said to be interested in another injection today.   Review of Systems     Objective:   Physical Exam  Constitutional: He is oriented to person, place, and time. He appears well-developed and well-nourished.  HENT:  Head: Normocephalic and atraumatic.  Eyes: Conjunctivae and EOM are normal.  Cardiovascular: Normal rate.   Pulmonary/Chest: Effort normal.  Musculoskeletal:  Right elbow and wrist with normal range of motion. He did have pain with pronation against resistance more than with supination. Tender over the lateral epicondyle. Also some swelling over the brachioradialis muscle.  Neurological: He is alert and oriented to person, place, and time.  Skin: Skin is dry. No pallor.  Psychiatric: He has a normal mood and affect. His behavior is normal.  Vitals reviewed.         Assessment & Plan:  Lateral epicondylitis - Discussed options. He would opt for injection today. Patient tolerated injection well. Recommend icing the area when he gets home as well as the brachioradialis muscle which is also a little bit swollen on exam today as well. If not improving then consider seeing one of our sports med docks. Avoid excessive and heavy lifting with that arm. Refilled meloxicam to take as needed.  Aspiration/Injection Procedure Note Raymond [name] [id number] 05-Sep-1985  Procedure:  Injection Indications: Right lateral epicondylitis  Procedure Details Consent: Risks of procedure as well as the alternatives and risks of each were explained to the (patient/caregiver).  Consent for procedure obtained. Time Out: Verified patient identification, verified procedure, site/side was marked, verified correct patient position, special equipment/implants available, medications/allergies/relevent history reviewed, required imaging and test results available.  Performed   Local Anesthesia Used:Ethyl Chloride Spray Amount of Fluid Aspirated: minimal amount Injection: 1 ml of  kenalog, 1 ml of lidocaine 1%.     Patient did tolerate procedure well. Estimated blood loss: none  Nani Gasser 05/03/2017, 4:46 PM

## 2017-06-12 ENCOUNTER — Other Ambulatory Visit: Payer: Self-pay | Admitting: Family Medicine

## 2017-06-12 DIAGNOSIS — F5102 Adjustment insomnia: Secondary | ICD-10-CM

## 2017-06-12 DIAGNOSIS — F5101 Primary insomnia: Secondary | ICD-10-CM

## 2017-07-04 ENCOUNTER — Encounter: Payer: Self-pay | Admitting: Family Medicine

## 2017-07-04 ENCOUNTER — Ambulatory Visit (INDEPENDENT_AMBULATORY_CARE_PROVIDER_SITE_OTHER): Payer: BLUE CROSS/BLUE SHIELD | Admitting: Family Medicine

## 2017-07-04 VITALS — BP 138/87 | HR 78 | Temp 98.5°F | Wt 154.0 lb

## 2017-07-04 DIAGNOSIS — J40 Bronchitis, not specified as acute or chronic: Secondary | ICD-10-CM

## 2017-07-04 MED ORDER — AZITHROMYCIN 250 MG PO TABS: 250.0000 mg | ORAL_TABLET | Freq: Every day | ORAL | 0 refills | Status: DC

## 2017-07-04 MED ORDER — IPRATROPIUM BROMIDE 0.06 % NA SOLN: 2.0000 | NASAL | 6 refills | Status: DC | PRN

## 2017-07-04 MED ORDER — GUAIFENESIN-CODEINE 100-10 MG/5ML PO SOLN: 5.0000 mL | Freq: Every evening | ORAL | 0 refills | Status: DC | PRN

## 2017-07-04 MED ORDER — PREDNISONE 10 MG PO TABS: 30.0000 mg | ORAL_TABLET | Freq: Every day | ORAL | 0 refills | Status: DC

## 2017-07-04 NOTE — Progress Notes (Signed)
Raymond PlumeJason L Coppernoll is a 31 y.o. male who [name] today for cough congestion runny nose subjective fevers and chills.  Symptoms ongoing now for approximately 4 days.  Barbara CowerJason is somewhat worsening but denies significant shortness of breath.  He has a history of spontaneous pneumothorax but denies symptoms similar to that.  No vomiting or diarrhea.  Cough is mildly productive now.  He is tried some over-the-counter medications for cough and cold which is only been mildly effective.   Past Medical History:  Diagnosis Date  . Spontaneous pneumothorax    Two in the past 2011  . WEIGHT LOSS, ABNORMAL 09/30/2007   Qualifier: Diagnosis of  By: Thomos LemonsBowen DO, Karen     Past Surgical History:  Procedure Laterality Date  . chest tubes    . INGUINAL HERNIA REPAIR Left 10/12/13   Social History   Tobacco Use  . Smoking status: Former Smoker    Packs/day: 0.75    Types: E-cigarettes  . Smokeless tobacco: Never Used  Substance Use Topics  . Alcohol use: No    Alcohol/week: 0.0 oz    Comment: Stopped Drinking in December. Was drinking 2-3 beers a day.   family history includes Anxiety disorder in his mother; Coronary artery disease in his maternal grandfather; Depression in his mother; Drug abuse in his brother; Hypertension in his father.  ROS as above:  Medications: Current Outpatient Medications  Medication Sig Dispense Refill  . meloxicam (MOBIC) 15 MG tablet Take 1 tablet (15 mg total) by mouth daily. 30 tablet 2  . PARoxetine (PAXIL) 20 MG tablet Take 1 tablet (20 mg total) by mouth daily. 30 tablet 4  . pregabalin (LYRICA) 200 MG capsule take 1 capsule by mouth three times a day 90 capsule 5  . zolpidem (AMBIEN) 10 MG tablet TAKE 1/2 TO 1 (ONE-HALF TO ONE) TABLET BY MOUTH AT BEDTIME AS NEEDED FOR SLEEP 30 tablet 3  . azithromycin (ZITHROMAX) 250 MG tablet Take 1  tablet (250 mg total) by mouth daily. Take first 2 tablets together, then 1 every day until finished. 6 tablet 0  . guaiFENesin-codeine 100-10 MG/5ML syrup Take 5 mLs by mouth at bedtime as needed for cough. 120 mL 0  . ipratropium (ATROVENT) 0.06 % nasal spray Place 2 sprays into both nostrils every 4 (four) hours as needed for rhinitis. 10 mL 6  . predniSONE (DELTASONE) 10 MG tablet Take 3 tablets (30 mg total) by mouth daily with breakfast. 15 tablet 0   No current facility-administered medications for this visit.    Allergies  Allergen Reactions  . Baclofen Nausea Only  . Cyclobenzaprine Other (See Comments)    Dry mouth    Health Maintenance Health Maintenance  Topic Date Due  . INFLUENZA VACCINE  11/04/2018 (Originally 03/06/2017)  . TETANUS/TDAP  07/21/2022  . HIV Screening  Completed     Exam:  BP 138/87   Pulse 78   Temp 98.5 F (36.9 C) (Oral)   Wt 154 lb (69.9 kg)   BMI 24.12 kg/m  Gen: Well NAD HEENT: EOMI,  MMM posterior pharynx with cobblestoning.  Normal tympanic membranes bilaterally.  Clear nasal discharge.  Mild cervical lymphadenopathy present bilaterally Lungs: Normal work of breathing. CTABL breath sounds are clear bilaterally Heart: RRR no MRG Abd: NABS, Soft. Nondistended, Nontender Exts: Brisk capillary refill, warm and well perfused.    No results found for this or any previous visit (from  the past 72 hour(s)). No results found.    Assessment and Plan: 31 y.o. male with bronchitis likely.  Possibly early secondary sickening to bacterial etiology.  Doubtful for spontaneous pneumothorax.  Plan for continued over-the-counter medications and supplemental treatment with prednisone Atrovent nasal spray and guaifenesin codeine cough syrup.  Use azithromycin as a backup if not better or if worsening.   No orders of the defined types were placed in this encounter.  Meds ordered this encounter  Medications  . guaiFENesin-codeine 100-10 MG/5ML syrup     Sig: Take 5 mLs by mouth at bedtime as needed for cough.    Dispense:  120 mL    Refill:  0  . predniSONE (DELTASONE) 10 MG tablet    Sig: Take 3 tablets (30 mg total) by mouth daily with breakfast.    Dispense:  15 tablet    Refill:  0  . ipratropium (ATROVENT) 0.06 % nasal spray    Sig: Place 2 sprays into both nostrils every 4 (four) hours as needed for rhinitis.    Dispense:  10 mL    Refill:  6  . azithromycin (ZITHROMAX) 250 MG tablet    Sig: Take 1 tablet (250 mg total) by mouth daily. Take first 2 tablets together, then 1 every day until finished.    Dispense:  6 tablet    Refill:  0     Discussed warning signs or symptoms. Please see discharge instructions. Patient expresses understanding.

## 2017-07-04 NOTE — Patient Instructions (Signed)
Thank you for coming in today. Take prednisone, and use the cough medicine at bedtime.  Continue over the counter medicines.  Use azithromycin as a backup if not better. Call or go to the emergency room if you get worse, have trouble breathing, have chest pains, or palpitations.    Acute Bronchitis, Adult Acute bronchitis is when air tubes (bronchi) in the lungs suddenly get swollen. The condition can make it hard to breathe. It can also cause these symptoms:  A cough.  Coughing up clear, yellow, or green mucus.  Wheezing.  Chest congestion.  Shortness of breath.  A fever.  Body aches.  Chills.  A sore throat.  Follow these instructions at home: Medicines  Take over-the-counter and prescription medicines only as told by your doctor.  If you were prescribed an antibiotic medicine, take it as told by your doctor. Do not stop taking the antibiotic even if you start to feel better. General instructions  Rest.  Drink enough fluids to keep your pee (urine) clear or pale yellow.  Avoid smoking and secondhand smoke. If you smoke and you need help quitting, ask your doctor. Quitting will help your lungs heal faster.  Use an inhaler, cool mist vaporizer, or humidifier as told by your doctor.  Keep all follow-up visits as told by your doctor. This is important. How is this prevented? To lower your risk of getting this condition again:  Wash your hands often with soap and water. If you cannot use soap and water, use hand sanitizer.  Avoid contact with people who have cold symptoms.  Try not to touch your hands to your mouth, nose, or eyes.  Make sure to get the flu shot every year.  Contact a doctor if:  Your symptoms do not get better in 2 weeks. Get help right away if:  You cough up blood.  You have chest pain.  You have very bad shortness of breath.  You become dehydrated.  You faint (pass out) or keep feeling like you are going to pass out.  You keep  throwing up (vomiting).  You have a very bad headache.  Your fever or chills gets worse. This information is not intended to replace advice given to you by your health care provider. Make sure you discuss any questions you have with your health care provider. Document Released: 01/09/2008 Document Revised: 02/29/2016 Document Reviewed: 01/11/2016 Elsevier Interactive Patient Education  2017 [employer]

## 2017-07-11 ENCOUNTER — Ambulatory Visit: Payer: Self-pay | Admitting: Family Medicine

## 2017-07-31 ENCOUNTER — Encounter: Payer: Self-pay | Admitting: Osteopathic Medicine

## 2017-07-31 ENCOUNTER — Ambulatory Visit (INDEPENDENT_AMBULATORY_CARE_PROVIDER_SITE_OTHER): Payer: BLUE CROSS/BLUE SHIELD | Admitting: Osteopathic Medicine

## 2017-07-31 VITALS — BP 133/85 | HR 81 | Temp 98.2°F | Wt 155.5 lb

## 2017-07-31 DIAGNOSIS — M7712 Lateral epicondylitis, left elbow: Secondary | ICD-10-CM

## 2017-07-31 MED ORDER — OXYCODONE-ACETAMINOPHEN 5-325 MG PO TABS: 1.0000 | ORAL_TABLET | Freq: Four times a day (QID) | ORAL | 0 refills | Status: DC | PRN

## 2017-07-31 MED ORDER — PREDNISONE 10 MG (48) PO TBPK
ORAL_TABLET | Freq: Every day | ORAL | 0 refills | Status: DC
Start: 2017-07-31 — End: 2017-08-23

## 2017-07-31 MED ORDER — DICLOFENAC SODIUM 1 % TD GEL: 4.0000 g | Freq: Four times a day (QID) | TRANSDERMAL | 11 refills | Status: DC

## 2017-07-31 MED ORDER — CELECOXIB 100 MG PO CAPS: 100.0000 mg | ORAL_CAPSULE | Freq: Two times a day (BID) | ORAL | 3 refills | Status: DC

## 2017-07-31 NOTE — Progress Notes (Signed)
HPI: Raymond PlumeJason L [name] is a 31 y.o. male who  has a past medical history of Spontaneous pneumothorax and WEIGHT LOSS, ABNORMAL (09/30/2007).  [facility] today, 07/31/17,  for chief complaint of:  Chief Complaint  Patient presents with  . Arm Pain     . Context: hx lateral epicondylitis R side, worse on L now.  . Location: L elbow lateral epicondylar area . Quality: sore, sharp pain . Duration: 1.5 mos . Timing: worse with motion, pronation . Modifying factors: repetitive strain with work  . Assoc signs/symptoms: no numbness, no swelling, no rash      Past medical, surgical, social and family history reviewed:  Patient Active Problem List   Diagnosis Date Noted  . Right foot pain 12/03/2016  . Pain, neck 12/03/2016  . 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 PTSD (post-traumatic stress disorder) 03/15/2016  . Synovitis of hand 02/24/2016  . Right knee pain 01/26/2016  . Low back pain 08/12/2014  . GAD (generalized anxiety disorder) 09/25/2013  . Insomnia 09/25/2013  . Paresthesia of both hands 09/25/2013  . History of pneumothorax 04/07/2010  . TRANSIENT DISORDER INITIATING/MAINTAINING SLEEP 12/02/2007  . OTHER THALASSEMIA 09/30/2007  . PAIN IN THORACIC SPINE 02/12/2007    Past Surgical History:  Procedure Laterality Date  . chest tubes    . INGUINAL HERNIA REPAIR Left 10/12/13    Social History   Tobacco Use  . Smoking status: Former Smoker    Packs/day: 0.75    Types: E-cigarettes    Last attempt to quit: 08/06/2014    Years since quitting: 2.9  . Smokeless tobacco: Never Used  Substance Use Topics  . Alcohol use: No    Alcohol/week: 0.0 oz    Comment: Stopped Drinking in December. Was drinking 2-3 beers a day.    Family History  Problem Relation Age of Onset  . Hypertension Father   . Anxiety disorder Mother   . Depression Mother   . Drug abuse Brother   . Coronary artery disease Maternal Grandfather   .  ADD / ADHD Neg Hx   . Alcohol abuse Neg Hx   . Bipolar disorder Neg Hx   . Dementia Neg Hx   . OCD Neg Hx   . Paranoid behavior Neg Hx   . Schizophrenia Neg Hx   . Thyroid disease Neg Hx      Current medication list and allergy/intolerance information reviewed:    Current Outpatient Medications  Medication Sig Dispense Refill  . meloxicam (MOBIC) 15 MG tablet Take 1 tablet (15 mg total) by mouth daily. 30 tablet 2  . PARoxetine (PAXIL) 20 MG tablet Take 1 tablet (20 mg total) by mouth daily. 30 tablet 4  . pregabalin (LYRICA) 200 MG capsule take 1 capsule by mouth three times a day 90 capsule 5  . zolpidem (AMBIEN) 10 MG tablet TAKE 1/2 TO 1 (ONE-HALF TO ONE) TABLET BY MOUTH AT BEDTIME AS NEEDED FOR SLEEP 30 tablet 3   No current facility-administered medications for this visit.     Allergies  Allergen Reactions  . Baclofen Nausea Only  . Cyclobenzaprine Other (See Comments)    Dry mouth      Review of Systems:  Constitutional:  Feeling physically well today except as HPI  HEENT: No  headache  Cardiac: No  chest pain  Respiratory:  No  shortness of breath. No  Cough  Gastrointestinal: No  abdominal pain, No  nausea  Musculoskeletal: +  myalgia/arthralgia  Skin: No  Rash  Neurologic: No  weakness, No  dizziness  Psychiatric: No  concerns with depression, No  concerns with anxiety  Exam:  BP 133/85   Pulse 81   Temp 98.2 F (36.8 C) (Oral)   Wt 155 lb 8 oz (70.5 kg)   BMI 24.35 kg/m   Constitutional: VS see above. General Appearance: alert, well-developed, well-nourished, NAD  Eyes: Normal lids and conjunctive, non-icteric sclera  Neck: No masses, trachea midline.  Respiratory: Normal respiratory effort.  Cardiovascular: S1/S2 normal, no murmur  Musculoskeletal: Gait normal. (+)tenderness lateral epicondyle L arm, wore with resisted pronation  Neurological: Normal balance/coordination. No tremor.      ASSESSMENT/PLAN:   Lateral epicondylitis  of left elbow - Plan: Ambulatory referral to Physical Therapy   Meds ordered this encounter  Medications  . predniSONE (STERAPRED UNI-PAK 48 TAB) 10 MG (48) TBPK tablet    Sig: Take by mouth daily. 12-Day taper, po    Dispense:  48 tablet    Refill:  0  . oxyCODONE-acetaminophen (PERCOCET) 5-325 MG tablet    Sig: Take 1 tablet by mouth every 6 (six) hours as needed for moderate pain or severe pain.    Dispense:  20 tablet    Refill:  0  . celecoxib (CELEBREX) 100 MG capsule    Sig: Take 1 capsule (100 mg total) by mouth 2 (two) times daily.    Dispense:  60 capsule    Refill:  3  . diclofenac sodium (VOLTAREN) 1 % GEL    Sig: Apply 4 g topically 4 (four) times daily. To affected joint.    Dispense:  100 g    Refill:  11       Patient Instructions  Plan:  Anti-inflammatories: Continue ibuprofen 800 mg 4 times per day for now, we'll try to get Celebrex covered. Can also use Voltaren gel.   Steroids: I sent in 12 day prednisone taper  Limited prescription for Percocet opiate pain medications: Used sparingly to prevent tolerance, if you think you may need long-term treatment with this medication, this would be a question to discuss with your primary care physician  Sent referral to physical [facility] the [facility], [facility] Denyse Am[facility]y.  Plan to follow-up with Dr. Denyse Amassorey in the next 2 weeks if no significant improvement. This may be something that you. Discussed whether MRI/surgical referral might be needed     Visit summary with medication list and pertinent instructions was printed for patient to review. All questions at time of visit were answered - patient instructed to contact office with any additional concerns. ER/RTC precautions were reviewed with the patient.   Follow-up plan: Return for Recheck with PCP/sports medicine as directed for left elbow pain.  Note: Total time spent 25 minutes, greater than 50% of the visit was  spent face-to-face counseling and coordinating care for the following: The encounter diagnosis was Lateral epicondylitis of left elbow..  Please note: voice recognition software was used to produce this document, and typos may escape review. Please contact Dr. Lyn HollingsheadAlexander for any needed clarifications.

## 2017-07-31 NOTE — Patient Instructions (Signed)
Plan:  Anti-inflammatories: Continue ibuprofen 800 mg 4 times per day for now, we'll try to get Celebrex covered. Can also use Voltaren gel.   Steroids: I sent in 12 day prednisone taper  Limited prescription for Percocet opiate pain medications: Used sparingly to prevent tolerance, if you think you may need long-term treatment with this medication, this would be a question to discuss with your primary care physician  [facility], continue home exercises as provided by Dr. Denyse Amassorey.  Plan to follow-up with Dr. Denyse Amassorey in the next 2 weeks if no significant improvement. This may be something that you. Discussed whether MRI/surgical referral might be needed

## 2017-08-01 ENCOUNTER — Other Ambulatory Visit: Payer: Self-pay | Admitting: Family Medicine

## 2017-08-01 DIAGNOSIS — M545 Low back pain: Principal | ICD-10-CM

## 2017-08-01 DIAGNOSIS — G8929 Other chronic pain: Secondary | ICD-10-CM

## 2017-08-01 NOTE — Telephone Encounter (Signed)
Metheney pt please advise

## 2017-08-08 ENCOUNTER — Telehealth: Payer: Self-pay | Admitting: *Deleted

## 2017-08-08 NOTE — Telephone Encounter (Signed)
Pre Authorization sent to cover my meds. [id number]

## 2017-08-13 ENCOUNTER — Ambulatory Visit (HOSPITAL_COMMUNITY): Payer: BLUE CROSS/BLUE SHIELD | Admitting: Psychiatry

## 2017-08-15 ENCOUNTER — Ambulatory Visit (INDEPENDENT_AMBULATORY_CARE_PROVIDER_SITE_OTHER): Payer: BLUE CROSS/BLUE SHIELD | Admitting: Psychiatry

## 2017-08-15 ENCOUNTER — Other Ambulatory Visit: Payer: Self-pay

## 2017-08-15 ENCOUNTER — Encounter (HOSPITAL_COMMUNITY): Payer: Self-pay | Admitting: Psychiatry

## 2017-08-15 VITALS — BP 120/82 | Ht 67.0 in | Wt 155.0 lb

## 2017-08-15 DIAGNOSIS — Z818 Family history of other mental and behavioral disorders: Secondary | ICD-10-CM

## 2017-08-15 DIAGNOSIS — Z87891 Personal history of nicotine dependence: Secondary | ICD-10-CM

## 2017-08-15 DIAGNOSIS — F4310 Post-traumatic stress disorder, unspecified: Secondary | ICD-10-CM

## 2017-08-15 DIAGNOSIS — Z813 Family history of other psychoactive substance abuse and dependence: Secondary | ICD-10-CM

## 2017-08-15 DIAGNOSIS — F5102 Adjustment insomnia: Secondary | ICD-10-CM | POA: Diagnosis not present

## 2017-08-15 DIAGNOSIS — F411 Generalized anxiety disorder: Secondary | ICD-10-CM

## 2017-08-15 MED ORDER — PAROXETINE HCL 20 MG PO TABS: 20.0000 mg | ORAL_TABLET | Freq: Every day | ORAL | 4 refills | Status: DC
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
## 2017-08-16 ENCOUNTER — Ambulatory Visit: Payer: Self-pay | Admitting: Family Medicine

## 2017-08-20 NOTE — Telephone Encounter (Signed)
request has been cancelled  DUPLICATE FOR APPROVED CASE (KEY #[id number] Effective on 08/30/2016 until 08/05/2038)

## 2017-08-23 ENCOUNTER — Telehealth: Payer: Self-pay | Admitting: *Deleted

## 2017-08-23 ENCOUNTER — Other Ambulatory Visit: Payer: Self-pay | Admitting: *Deleted

## 2017-08-23 ENCOUNTER — Ambulatory Visit: Payer: Self-pay | Admitting: Family Medicine

## 2017-08-23 MED ORDER — PREDNISONE 10 MG (48) PO TBPK: ORAL_TABLET | Freq: Every day | ORAL | 0 refills | Status: DC

## 2017-08-23 NOTE — Telephone Encounter (Signed)
Spoke w/pt and advised him of appt today. Asked if he wanted to have an injection done for his R elbow or if he wanted medication for pain? Pt stated that the pain medication worked very well and would like a refill of this. He said that the injections have caused his skin to become discolored and thin and feels that it hasn't helped and believes that he will need to have surgery to really help with this. He also said that he has an upcoming appt with PT next week.  I informed him that Dr. Linford ArnoldMetheney does NOT write NARCOTIC medications for this condition however, we can place a referral for him to be seen by a pain clinic pt was agreeable to this and said that he felt like the prednisone helped him and asked if this can be refilled for him. I told him that we can refill this.   He wanted to know when he would need to f/u for his regular med refills I advised that he make an appt for mid February. Pt agreed to this and stated that he would schedule.Loralee PacasBarkley, Baldwin Racicot ElversonLynetta
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
## 2017-08-26 ENCOUNTER — Telehealth: Payer: Self-pay

## 2017-08-26 MED ORDER — PREDNISONE 20 MG PO TABS: 40.0000 mg | ORAL_TABLET | Freq: Every day | ORAL | 0 refills | Status: DC

## 2017-08-26 NOTE — Addendum Note (Signed)
Addended by: Nani GasserMETHENEY, Zayden Maffei D on: 08/26/2017 05:30 PM   Modules accepted: Orders

## 2017-08-26 NOTE — Telephone Encounter (Signed)
Patient's wife calling in requesting Prednisone dose pack be switched to single form due to cost.

## 2017-08-26 NOTE — Telephone Encounter (Signed)
Was informed that it would cost $34.78 w/insurance. Last yr it was free due to him meeting his deductible.Raymond [name], Raymond [name] Raymond [name]  Will fwd to pcp for another prescription .Raymond [name], Raymond Pavek EdgeleyLynetta

## 2017-08-26 NOTE — Telephone Encounter (Signed)
New rx for prednisone 40mg  qd x 5 days [facility]

## 2017-08-29 ENCOUNTER — Encounter: Payer: Self-pay | Admitting: Rehabilitative and Restorative Service Providers"

## 2017-08-29 ENCOUNTER — Ambulatory Visit: Payer: BLUE CROSS/BLUE SHIELD | Admitting: Rehabilitative and Restorative Service Providers"

## 2017-08-29 DIAGNOSIS — R531 Weakness: Secondary | ICD-10-CM | POA: Diagnosis not present

## 2017-08-29 DIAGNOSIS — Z7409 Other reduced mobility: Secondary | ICD-10-CM

## 2017-08-29 DIAGNOSIS — R29898 Other symptoms and signs involving the musculoskeletal system: Secondary | ICD-10-CM

## 2017-08-29 DIAGNOSIS — M25522 Pain in left elbow: Secondary | ICD-10-CM | POA: Diagnosis not present

## 2017-08-29 NOTE — Patient Instructions (Addendum)
Wrist Extensor Stretch    Keeping elbow straight, grasp left hand and slowly bend wrist forward until stretch is felt. Hold __30__ seconds. Relax. Repeat _2-3__ times per set. Do __several__ sessions per day.   transverse friction massage - working across the fibers at elbow 1 min followed by ice massage x 5 min twice a day    IONTOPHORESIS PATIENT PRECAUTIONS & CONTRAINDICATIONS:  . Redness under one or both electrodes can occur.  This characterized by a uniform redness that usually disappears within 12 hours of treatment. . Small pinhead size blisters may result in response to the drug.  Contact your physician if the problem persists more than 24 hours. . On rare occasions, iontophoresis therapy can result in temporary skin reactions such as rash, inflammation, irritation or burns.  The skin reactions may be the result of individual sensitivity to the ionic solution used, the condition of the skin at the start of treatment, reaction to the materials in the electrodes, allergies or sensitivity to dexamethasone, or a poor connection between the patch and your skin.  Discontinue using iontophoresis if you have any of these reactions and report to your therapist. . Remove the Patch or electrodes if you have any undue sensation of pain or burning during the treatment and report discomfort to your therapist. . Tell your Therapist if you have had known adverse reactions to the application of electrical current. . If using the Patch, the LED light will turn off when treatment is complete and the patch can be removed.  Approximate treatment time is 1-3 hours.  Remove the patch when light goes off or after 6 hours. . The Patch can be worn during normal activity, however excessive motion where the electrodes have been placed can cause poor contact between the skin and the electrode or uneven electrical current resulting in greater risk of skin irritation. Marland Kitchen. Keep out of the reach of children.   . DO  NOT use if you have a cardiac pacemaker or any other electrically sensitive implanted device. . DO NOT use if you have a known sensitivity to dexamethasone. . DO NOT use during Magnetic Resonance Imaging (MRI). . DO NOT use over broken or compromised skin (e.g. sunburn, cuts, or acne) due to the increased risk of skin reaction. . DO NOT SHAVE over the area to be treated:  To establish good contact between the Patch and the skin, excessive hair may be clipped. . DO NOT place the Patch or electrodes on or over your eyes, directly over your heart, or brain. . DO NOT reuse the Patch or electrodes as this may cause burns to occur.
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
## 2017-09-05 ENCOUNTER — Encounter: Payer: Self-pay | Admitting: Rehabilitative and Restorative Service Providers"

## 2017-09-07 ENCOUNTER — Other Ambulatory Visit: Payer: Self-pay | Admitting: Osteopathic Medicine

## 2017-09-07 DIAGNOSIS — G8929 Other chronic pain: Secondary | ICD-10-CM

## 2017-09-07 DIAGNOSIS — M545 Low back pain: Principal | ICD-10-CM

## 2017-09-09 ENCOUNTER — Other Ambulatory Visit: Payer: Self-pay

## 2017-09-09 DIAGNOSIS — M545 Low back pain, unspecified: Secondary | ICD-10-CM

## 2017-09-09 DIAGNOSIS — G8929 Other chronic pain: Secondary | ICD-10-CM

## 2017-09-09 MED ORDER — PREGABALIN 200 MG PO CAPS: ORAL_CAPSULE | ORAL | 0 refills | Status: DC

## 2017-09-09 NOTE — Telephone Encounter (Signed)
Patient would like a refill on Lyrica.

## 2017-09-10 ENCOUNTER — Other Ambulatory Visit: Payer: Self-pay

## 2017-09-10 ENCOUNTER — Telehealth: Payer: Self-pay | Admitting: Family Medicine

## 2017-09-10 DIAGNOSIS — M545 Low back pain, unspecified: Secondary | ICD-10-CM

## 2017-09-10 DIAGNOSIS — M7712 Lateral epicondylitis, left elbow: Secondary | ICD-10-CM

## 2017-09-10 DIAGNOSIS — G8929 Other chronic pain: Secondary | ICD-10-CM

## 2017-09-10 MED ORDER — PREGABALIN 200 MG PO CAPS: ORAL_CAPSULE | ORAL | 0 refills | Status: DC

## 2017-09-10 NOTE — Telephone Encounter (Signed)
Med refilled today

## 2017-09-10 NOTE — Telephone Encounter (Signed)
Dr. Linford ArnoldMetheney please see Tonya's phone message from Aug 23, 2017 to place a referral for this patient. Pt mentioned he has not heard anything from referral place.

## 2017-09-10 NOTE — Progress Notes (Signed)
Cannot locate Rx printed yesterday, reprinted for PCP.

## 2017-09-10 NOTE — Telephone Encounter (Signed)
New referral placed for pain management.  Do encourage him to continue with physical therapy as well.

## 2017-09-11 NOTE — Telephone Encounter (Signed)
Left VM for Pt with status update and recommendation.

## 2017-09-12 ENCOUNTER — Ambulatory Visit: Payer: BLUE CROSS/BLUE SHIELD | Admitting: Physical Therapy

## 2017-09-12 DIAGNOSIS — Z7409 Other reduced mobility: Secondary | ICD-10-CM | POA: Diagnosis not present

## 2017-09-12 DIAGNOSIS — R29898 Other symptoms and signs involving the musculoskeletal system: Secondary | ICD-10-CM | POA: Diagnosis not present

## 2017-09-12 DIAGNOSIS — M25522 Pain in left elbow: Secondary | ICD-10-CM | POA: Diagnosis not present

## 2017-09-12 DIAGNOSIS — R531 Weakness: Secondary | ICD-10-CM

## 2017-09-12 NOTE — Therapy (Signed)
[place], [place], [address] Phone: [phone]   Fax:  [phone]  Physical Therapy Treatment  Patient Details  Name: Raymond [name] MRN: [id number] Date of Birth: 1986-01-16 Referring Provider: Dr Emeterio Reeve    Encounter Date: 09/12/2017  PT End of Session - 09/12/17 1626    Visit Number  2    Number of Visits  12    Date for PT Re-Evaluation  10/10/17    PT Start Time  1624    PT Stop Time  1707    PT Time Calculation (min)  43 min    Activity Tolerance  Patient tolerated treatment well    Behavior During Therapy  [facility] for tasks assessed/performed       Past Medical History:  Diagnosis Date  . Spontaneous pneumothorax    Two in the past 2011  . WEIGHT LOSS, ABNORMAL 09/30/2007   Qualifier: Diagnosis of  By: Esmeralda Arthur      Past Surgical History:  Procedure Laterality Date  . chest tubes    . INGUINAL HERNIA REPAIR Left 10/12/13    There were no vitals filed for this visit.  Subjective Assessment - 09/12/17 1626    Subjective  Pt reports his elbows feel similar to last session.  He has been using brace everyother day, self massage, ice, and stretches daily.     Currently in Pain?  Yes    Pain Score  4  up to 9/10 with use.     Pain Location  Elbow    Pain Orientation  Left;Right    Pain Descriptors / Indicators  Stabbing;Sharp;Burning    Aggravating Factors   lifting, certain positions    Pain Relieving Factors  heat, ice, medication, tape.          [facility] PT Assessment - 09/12/17 0001      Strength   Right Hand Grip (lbs)  105    Right Hand Lateral Pinch  12 lbs    Right Hand 3 Point Pinch  15 lbs    Left Hand Grip (lbs)  59    Left Hand Lateral Pinch  11 lbs    Left Hand 3 Point Pinch  13 lbs          [facility] Adult PT Treatment/Exercise - 09/12/17 0001      Self-Care   Self-Care  Other Self-Care Comments    Other Self-Care Comments   Pt educated on TPR to  musculature around lateral epicondyles, via demo and VC.  Pt verbalized understanding.       Wrist Exercises   Other wrist exercises  cross over wrist pronation and flexion stretch x 30 sec x 2 reps each arm.     Other wrist exercises  resisted finger ext x 10 reps (fingers in rubber glove) x 2 sets each hand.       Modalities   Modalities  Iontophoresis;Ultrasound      Ultrasound   Ultrasound Location  bilat lateral epicondyle     Ultrasound Parameters  100%, 3.13mz, 1.1 w./cm2, 8 min total    Ultrasound Goals  Pain tightness.       Iontophoresis   Type of Iontophoresis  Dexamethasone    Location  bilat lateral epicondyles     Dose  1.0 cc each arm    Time  833m 6 hr patch      Manual Therapy   Manual Therapy  Soft tissue mobilization  Soft tissue mobilization  TPR to bilat musculature surrounding lateral epicondyles; Edge tool assistance to Rt/Lt wrist extensors, distal triceps, brachioradialus.                   PT Long Term Goals - 09/12/17 1805      PT LONG TERM GOAL #1   Title  Patient I in HEP for discharge 10/10/17    Time  6    Period  Weeks    Status  On-going      PT LONG TERM GOAL #2   Title  Decrease pain with active UE ROM to no more than 2-3/10 on 0/10 pain scale 10/10/17    Time  6    Period  Weeks    Status  On-going      PT LONG TERM GOAL #3   Title  Improve grip and pinch strength Lt hand to 80# grip; 11# three point pinch and 13# lateral pinch 10/10/17    Time  6    Period  Weeks    Status  Partially Met      PT LONG TERM GOAL #4   Title  Decrease pain with functional and work tasks by 50-70% per pt report 10/10/17    Time  6    Period  Weeks    Status  On-going      PT LONG TERM GOAL #5   Title  Improve FOTO to </= 29 % limitation 10/10/17    Time  6    Period  Weeks    Status  On-going            Plan - 09/12/17 1803    Clinical Impression Statement  Pt has been away from therapy for 2 wks.  He has had minimal improvement  with stretches and self care techniques.  Pt shown 1 additional stretch and TPR. Pt encouraged to attend therapy on more regular basis for assistance in meeting goals. LTG#3 partially met.     Rehab Potential  Good    PT Frequency  2x / week    PT Duration  6 weeks    PT Treatment/Interventions  Patient/family education;ADLs/Self Care Home Management;Cryotherapy;Electrical Stimulation;Iontophoresis 68m/ml Dexamethasone;Dry needling;Manual techniques;Neuromuscular re-education;Therapeutic activities;Therapeutic exercise    PT Next Visit Plan  assess response to ionto.  add prone thoracic strengthening.     Consulted and Agree with Plan of Care  Patient       Patient will benefit from skilled therapeutic intervention in order to improve the following deficits and impairments:  Postural dysfunction, Improper body mechanics, Pain, Increased fascial restricitons, Increased muscle spasms, Decreased range of motion, Decreased strength, Decreased activity tolerance, Impaired UE functional use  Visit Diagnosis: Pain in left elbow  Impaired functional mobility and endurance  Weakness generalized  Other symptoms and signs involving the musculoskeletal system     Problem List Patient Active Problem List   Diagnosis Date Noted  . Right foot pain 12/03/2016  . Pain, neck 12/03/2016  . 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 PTSD (post-traumatic stress disorder) 03/15/2016  . Synovitis of hand 02/24/2016  . Right knee pain 01/26/2016  . Low back pain 08/12/2014  . GAD (generalized anxiety disorder) 09/25/2013  . Insomnia 09/25/2013  . Paresthesia of both hands 09/25/2013  . History of pneumothorax 04/07/2010  . TRANSIENT DISORDER INITIATING/MAINTAINING SLEEP 12/02/2007  . OTHER THALASSEMIA 09/30/2007  . PAIN IN THORACIC SPINE 02/12/2007   JKerin Perna PTA 09/12/17 6:11 PM  [facility] [place], [place],  [address] Phone: [phone]   Fax:  [phone]  Name: Raymond [name] MRN: [id number] Date of Birth: Aug 01, 1986

## 2017-09-18 ENCOUNTER — Ambulatory Visit: Payer: BLUE CROSS/BLUE SHIELD | Admitting: Physical Therapy

## 2017-09-18 ENCOUNTER — Encounter: Payer: Self-pay | Admitting: Physical Therapy

## 2017-09-18 DIAGNOSIS — M25522 Pain in left elbow: Secondary | ICD-10-CM

## 2017-09-18 DIAGNOSIS — R29898 Other symptoms and signs involving the musculoskeletal system: Secondary | ICD-10-CM | POA: Diagnosis not present

## 2017-09-18 DIAGNOSIS — Z7409 Other reduced mobility: Secondary | ICD-10-CM

## 2017-09-18 DIAGNOSIS — R531 Weakness: Secondary | ICD-10-CM | POA: Diagnosis not present

## 2017-09-18 NOTE — Therapy (Signed)
[place], [place], [address] Phone: [phone]   Fax:  [phone]  Physical Therapy Treatment  Patient Details  Name: Raymond [name] MRN: [id number] Date of Birth: 07-20-1986 Referring Provider: Dr. Emeterio Reeve   Encounter Date: 09/18/2017  PT End of Session - 09/18/17 1455    Visit Number  3    Number of Visits  12    Date for PT Re-Evaluation  10/10/17    PT Start Time  2263    PT Stop Time  1520    PT Time Calculation (min)  47 min    Activity Tolerance  Patient tolerated treatment well    Behavior During Therapy  [facility] for tasks assessed/performed       Past Medical History:  Diagnosis Date  . Spontaneous pneumothorax    Two in the past 2011  . WEIGHT LOSS, ABNORMAL 09/30/2007   Qualifier: Diagnosis of  By: Esmeralda Arthur      Past Surgical History:  Procedure Laterality Date  . chest tubes    . INGUINAL HERNIA REPAIR Left 10/12/13    There were no vitals filed for this visit.  Subjective Assessment - 09/18/17 1443    Subjective  Pt reports he had some burning and irritation from last ionto patches. "I feel like I've gone backwards since last visit, but it is because my work load has increased".  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  Pt reports he is still taking prednisone.     Patient Stated Goals  relieve pain in elbows    Currently in Pain?  Yes    Pain Score  5     Pain Location  Elbow    Pain Orientation  Right;Left    Pain Descriptors / Indicators  Aching sharp with certain motions    Aggravating Factors   lifting, certain position    Pain Relieving Factors  heat, ice, medication.          [facility] PT Assessment - 09/18/17 0001      Assessment   Medical Diagnosis  Lt/Rt lateral epicondylitis     Referring Provider  Dr. Emeterio Reeve    Onset Date/Surgical Date  04/06/17  Lt; 03/06/16 Rt     Hand Dominance  Right    Next MD Visit  PRN       Strength   Right Hand Grip (lbs)  80, 100    Left Hand Grip (lbs)  72, 83       [facility] Adult PT Treatment/Exercise - 09/18/17 0001      Shoulder Exercises: Standing   External Rotation  Strengthening;Both;10 reps;Theraband 2 sets    Theraband Level (Shoulder External Rotation)  Level 3 (Green)    Extension  Strengthening;Both;15 reps;Theraband    Theraband Level (Shoulder Extension)  Level 3 (Green)    Row  Strengthening;Both;12 reps;Theraband 2 sets    Theraband Level (Shoulder Row)  Level 3 (Green)      Wrist Exercises   Wrist Flexion  Strengthening;Right;Left;15 reps;Seated;Bar weights/barbell    Bar Weights/Barbell (Wrist Flexion)  3 lbs    Wrist Extension  Strengthening;Right;Left;15 reps;Seated;Bar weights/barbell    Bar Weights/Barbell (Wrist Extension)  3 lbs    Wrist Radial Deviation  Strengthening;Right;Left;10 reps;Seated;Bar weights/barbell    Bar Weights/Barbell (Radial Deviation)  3 lbs    Other wrist exercises  cross over wrist pronation and flexion  stretch x 60 sec x 2 reps each arm.     Other wrist exercises  resisted finger ext x 10 reps (fingers in rubber glove) x 2 sets each hand.       Ultrasound   Ultrasound Location  bilat lateral epicondyle    Ultrasound Parameters  100%, 3.3 mHz, 8 ,min total    Ultrasound Goals  Pain tightness.       Iontophoresis   Type of Iontophoresis  Dexamethasone    Location  bilat lateral epicondyle tissue     Dose  1.0 cc each arm     Time  37m, 6 hr patch      Manual Therapy   Manual Therapy  Soft tissue mobilization    Soft tissue mobilization  TPR to bilat musculature surrounding lateral epicondyles            PT Education - 09/18/17 1545    Education provided  Yes    Education Details  HEP - added row, shoulder ext, ER with green band (issued previous episode of care).     Person(s) Educated  Patient    Methods  Explanation;Demonstration     Comprehension  Verbalized understanding;Returned demonstration          PT Long Term Goals - 09/12/17 1805      PT LONG TERM GOAL #1   Title  Patient I in HEP for discharge 10/10/17    Time  6    Period  Weeks    Status  On-going      PT LONG TERM GOAL #2   Title  Decrease pain with active UE ROM to no more than 2-3/10 on 0/10 pain scale 10/10/17    Time  6    Period  Weeks    Status  On-going      PT LONG TERM GOAL #3   Title  Improve grip and pinch strength Lt hand to 80# grip; 11# three point pinch and 13# lateral pinch 10/10/17    Time  6    Period  Weeks    Status  Partially Met      PT LONG TERM GOAL #4   Title  Decrease pain with functional and work tasks by 50-70% per pt report 10/10/17    Time  6    Period  Weeks    Status  On-going      PT LONG TERM GOAL #5   Title  Improve FOTO to </= 29 % limitation 10/10/17    Time  6    Period  Weeks    Status  On-going            Plan - 09/18/17 1536    Clinical Impression Statement  Improved Lt grip strength.  Pt tolerated all exercises well, without increase in pain, just fatigue.  Encouraged pt to be more compliant with HEP and change lifting technique at work to avoid further aggrivation of symptoms.      Rehab Potential  Good    PT Frequency  2x / week    PT Duration  6 weeks    PT Treatment/Interventions  Patient/family education;ADLs/Self Care Home Management;Cryotherapy;Electrical Stimulation;Iontophoresis 447mml Dexamethasone;Dry needling;Manual techniques;Neuromuscular re-education;Therapeutic activities;Therapeutic exercise    PT Next Visit Plan  assess response to (and compliance of) HEP.  add prone thoracic strengthening.     Consulted and Agree with Plan of Care  Patient       Patient will benefit from skilled therapeutic intervention in order to improve  the following deficits and impairments:  Postural dysfunction, Improper body mechanics, Pain, Increased fascial restricitons, Increased muscle spasms,  Decreased range of motion, Decreased strength, Decreased activity tolerance, Impaired UE functional use  Visit Diagnosis: Pain in left elbow  Impaired functional mobility and endurance  Weakness generalized  Other symptoms and signs involving the musculoskeletal system     Problem List Patient Active Problem List   Diagnosis Date Noted  . Right foot pain 12/03/2016  . Pain, neck 12/03/2016  . 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 PTSD (post-traumatic stress disorder) 03/15/2016  . Synovitis of hand 02/24/2016  . Right knee pain 01/26/2016  . Low back pain 08/12/2014  . GAD (generalized anxiety disorder) 09/25/2013  . Insomnia 09/25/2013  . Paresthesia of both hands 09/25/2013  . History of pneumothorax 04/07/2010  . TRANSIENT DISORDER INITIATING/MAINTAINING SLEEP 12/02/2007  . OTHER THALASSEMIA 09/30/2007  . PAIN IN THORACIC SPINE 02/12/2007   Kerin Perna, PTA 09/18/17 3:47 PM  [place], [place], [address] Phone: [phone]   Fax:  [phone]  Name: Raymond [name] MRN: [id number] Date of Birth: 1985/11/08

## 2017-09-24 ENCOUNTER — Ambulatory Visit: Payer: BLUE CROSS/BLUE SHIELD | Admitting: Family Medicine

## 2017-09-24 ENCOUNTER — Encounter: Payer: Self-pay | Admitting: Family Medicine

## 2017-09-24 VITALS — BP 131/76 | HR 83 | Ht 67.0 in | Wt 165.0 lb

## 2017-09-24 DIAGNOSIS — F5101 Primary insomnia: Secondary | ICD-10-CM

## 2017-09-24 DIAGNOSIS — M7712 Lateral epicondylitis, left elbow: Secondary | ICD-10-CM | POA: Diagnosis not present

## 2017-09-24 DIAGNOSIS — F411 Generalized anxiety disorder: Secondary | ICD-10-CM

## 2017-09-24 MED ORDER — PAROXETINE HCL 30 MG PO TABS: 30.0000 mg | ORAL_TABLET | Freq: Every day | ORAL | 3 refills | Status: DC

## 2017-09-24 MED ORDER — ESZOPICLONE 3 MG PO TABS: 3.0000 mg | ORAL_TABLET | Freq: Every day | ORAL | 2 refills | Status: DC

## 2017-09-24 NOTE — Progress Notes (Signed)
Subjective:    Patient ID: Raymond [name], male    DOB: 1985/10/03, 32 y.o.   MRN: [id number]  HPI F/U insomnia -he feels like the Ambien is causing some memory loss occasionally.  Some nights he is only able to get 6 hours of sleep versus 8 hours.  Usually goes down to work around 3 AM and often does not get off until for 5:00 the next day.  He is been working 60+ hours per week.  He is noticed that on mornings where he is not able to get 8 hours of sleep he says he will sometimes get to work and then realize he does not remember getting ready for work.  Would like to switch back to [place].  He says he does not remember experiencing those type of symptoms.  The only side effect he really had was a metallic taste in his mouth.  F/U GAD -he does feel a little bit more anxious at work these days.  He is been working long hours and is more fatigued.  He sometimes worries what others are thinking about him.  Is currently taking Paxil 20 mg daily.  He also had a slight cough over the weekend but if feeling some better.   He does continue to have pain with his elbows but knows that it is from over work and overuse because he is working long hours and has a very physically straining job.  He has gone to a couple of physical therapy sessions for this.  Review of Systems   BP 131/76   Pulse 83   Ht 5\' 7"  (1.702 m)   Wt 165 lb (74.8 kg)   SpO2 99%   BMI 25.84 kg/m     Allergies  Allergen Reactions  . Baclofen Nausea Only  . Cyclobenzaprine Other (See Comments)    Dry mouth    Past Medical History:  Diagnosis Date  . Spontaneous pneumothorax    Two in the past 2011  . WEIGHT LOSS, ABNORMAL 09/30/2007   Qualifier: Diagnosis of  By: Thomos LemonsBowen DO, Karen      Past Surgical History:  Procedure Laterality Date  . chest tubes    . INGUINAL HERNIA REPAIR Left 10/12/13    Social History   Socioeconomic History  . Marital status: Single    Spouse name: Not on file  . Number of children: Not on  file  . Years of education: Not on file  . Highest education level: Not on file  Social Needs  . Financial resource strain: Not on file  . Food insecurity - worry: Not on file  . Food insecurity - inability: Not on file  . Transportation needs - medical: Not on file  . Transportation needs - non-medical: Not on file  Occupational History  . Occupation: Work at [employer]  Tobacco Use  . Smoking status: Former Smoker    Packs/day: 0.75    Types: E-cigarettes    Last attempt to quit: 08/06/2014    Years since quitting: 3.1  . Smokeless tobacco: Never Used  Substance and Sexual Activity  . Alcohol use: No    Alcohol/week: 0.0 oz    Comment: Stopped Drinking in December. Was drinking 2-3 beers a day.  . 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 6th Caffeine: Coffee 2 cups/pots per day.   Marland Kitchen. Sexual activity: Yes    Partners: Female    Comment: No birth control  Other Topics Concern  . Not  on file  Social History Narrative  . Not on file    Family History  Problem Relation Age of Onset  . Hypertension Father   . Anxiety disorder Mother   . Depression Mother   . Drug abuse Brother   . Coronary artery disease Maternal Grandfather   . ADD / ADHD Neg Hx   . Alcohol abuse Neg Hx   . Bipolar disorder Neg Hx   . Dementia Neg Hx   . OCD Neg Hx   . Paranoid behavior Neg Hx   . Schizophrenia Neg Hx   . Thyroid disease Neg Hx     Outpatient Encounter Medications as of 09/24/2017  Medication Sig  . celecoxib (CELEBREX) 100 MG capsule Take 1 capsule (100 mg total) by mouth 2 (two) times daily.  . diclofenac sodium (VOLTAREN) 1 % GEL Apply 4 g topically 4 (four) times daily. To affected joint.  Marland Kitchen PARoxetine (PAXIL) 30 MG tablet Take 1 tablet (30 mg total) by mouth daily.  . pregabalin (LYRICA) 200 MG capsule TAKE 1 CAPSULE BY MOUTH THREE TIMES DAILY  . [DISCONTINUED] PARoxetine (PAXIL) 20 MG tablet Take 1 tablet (20 mg total) by mouth daily.  . [DISCONTINUED] zolpidem  (AMBIEN) 10 MG tablet TAKE 1/2 TO 1 (ONE-HALF TO ONE) TABLET BY MOUTH AT BEDTIME AS NEEDED FOR SLEEP  . Eszopiclone 3 MG TABS Take 1 tablet (3 mg total) by mouth at bedtime. Take immediately before bedtime  . [DISCONTINUED] predniSONE (DELTASONE) 20 MG tablet Take 2 tablets (40 mg total) by mouth daily with breakfast.  . [DISCONTINUED] sertraline (ZOLOFT) 100 MG tablet Take 1 tablet (100 mg total) by mouth daily. (Patient not taking: Reported on 03/06/2016)   No facility-administered encounter medications on file as of 09/24/2017.           Objective:   Physical Exam  Constitutional: He is oriented to person, place, and time. He appears well-developed and well-nourished.  HENT:  Head: Normocephalic and atraumatic.  Cardiovascular: Normal rate, regular rhythm and normal heart sounds.  Pulmonary/Chest: Effort normal and breath sounds normal.  Neurological: He is alert and oriented to person, place, and time.  Skin: Skin is warm and dry.  Psychiatric: He has a normal mood and affect. His behavior is normal.          Assessment & Plan:   GAD -  PHQ - 9 score of 5 and GAD - 7 score of 9.  We discussed options.  I would like to increase his Paxil to 30 mg daily.  If he is doing well then I will just see him back in 3 months.  If we need to make further adjustments he can give me a call and let me know or we can even consider adding BuSpar for short period of time.  Insomnia -will discontinue Ambien.  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  We will switch him back to [facility] which she had taken previously and done well with.  Lateral epicondylitis-until he is able to significantly reduce his work hours he would likely continue to have problems with his elbows.  Hopefully he might be able to attend a few more therapy sessions.

## 2017-09-26 ENCOUNTER — Ambulatory Visit: Payer: BLUE CROSS/BLUE SHIELD | Admitting: Physical Therapy

## 2017-09-26 DIAGNOSIS — M25522 Pain in left elbow: Secondary | ICD-10-CM

## 2017-09-26 DIAGNOSIS — Z7409 Other reduced mobility: Secondary | ICD-10-CM | POA: Diagnosis not present

## 2017-09-26 DIAGNOSIS — R29898 Other symptoms and signs involving the musculoskeletal system: Secondary | ICD-10-CM

## 2017-09-26 DIAGNOSIS — R531 Weakness: Secondary | ICD-10-CM | POA: Diagnosis not present

## 2017-09-26 NOTE — Therapy (Signed)
[place], [place], [address] Phone: [phone]   Fax:  [phone]  Physical Therapy Treatment  Patient Details  Name: Raymond [name] MRN: [id number] Date of Birth: [birth year] Referring Provider: Dr. Emeterio Reeve   Encounter Date: 09/26/2017  PT End of Session - 09/26/17 1701    Visit Number  4    Number of Visits  12    Date for PT Re-Evaluation  10/10/17    PT Start Time  1619    PT Stop Time  1701    PT Time Calculation (min)  42 min    Activity Tolerance  Patient tolerated treatment well    Behavior During Therapy  [facility] for tasks assessed/performed       Past Medical History:  Diagnosis Date  . Spontaneous pneumothorax    Two in the past 2011  . WEIGHT LOSS, ABNORMAL 09/30/2007   Qualifier: Diagnosis of  By: Esmeralda Arthur      Past Surgical History:  Procedure Laterality Date  . chest tubes    . INGUINAL HERNIA REPAIR Left 10/12/13    There were no vitals filed for this visit.  Subjective Assessment - 09/26/17 1620    Subjective  Pt reported he really aggrivated his Lt elbow Monday; he used ice, stretched, took medicine to calm it back down.  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     Currently in Pain?  Yes    Pain Score  3     Pain Location  Elbow    Pain Orientation  Left;Right    Pain Descriptors / Indicators  Aching    Aggravating Factors   lifting, certain position    Pain Relieving Factors  heat, ice, medication.          [facility] PT Assessment - 09/26/17 0001      Observation/Other Assessments   Focus on Therapeutic Outcomes (FOTO)   33% limited      Strength   Right Hand Grip (lbs)  105    Left Hand Grip (lbs)  75       [facility] Adult PT Treatment/Exercise - 09/26/17 0001      Shoulder Exercises: Standing   External Rotation  Strengthening;Both;10  reps;Theraband 2 sets    Theraband Level (Shoulder External Rotation)  Level 3 (Green)      Wrist Exercises   Wrist Flexion  Strengthening;Right;Left;20 reps yellow flex bar, bilat    Wrist Extension  Strengthening;Right;Left;20 reps;Seated yellow flex bar, bilat    Other wrist exercises  cross over wrist pronation and flexion stretch x 60 sec x 4 reps each arm.  Wrist ext/flexion stretch x 45 sec x 3 reps each arm.    Other wrist exercises  resisted finger ext x 10 reps (fingers in rubber glove) x 2 sets each hand.       Ultrasound   Ultrasound Location  bilat lateral epicondyle.     Ultrasound Parameters  100%, 3.3 mHz, 1.1 w/cm2 x 8 min total.     Ultrasound Goals  Pain tightness.       Iontophoresis   Type of Iontophoresis  Dexamethasone    Location  bilat lateral epicondyle tissue     Dose  1.0 cc each arm     Time  47m, 6 hr patch      Manual Therapy   Manual  Therapy  Soft tissue mobilization    Soft tissue mobilization  TPR to bilat musculature surrounding lateral epicondyles; Edge tool assistance to Rt/Lt wrist extensors, distal triceps, brachioradialus.                   PT Long Term Goals - 09/26/17 1623      PT LONG TERM GOAL #1   Title  Patient I in HEP for discharge 10/10/17    Time  6    Period  Weeks    Status  On-going      PT LONG TERM GOAL #2   Title  Decrease pain with active UE ROM to no more than 2-3/10 on 0/10 pain scale 10/10/17    Time  6    Period  Weeks    Status  On-going pain up to 4-5/10 with use.       PT LONG TERM GOAL #3   Title  Improve grip and pinch strength Lt hand to 80# grip; 11# three point pinch and 13# lateral pinch 10/10/17    Period  Weeks    Status  Partially Met      PT LONG TERM GOAL #4   Title  Decrease pain with functional and work tasks by 50-70% per pt report 10/10/17    Time  6    Period  Weeks    Status  On-going      PT LONG TERM GOAL #5   Title  Improve FOTO to </= 29 % limitation 10/10/17    Time  6     Period  Weeks    Status  On-going            Plan - 09/26/17 1754    Clinical Impression Statement  Grip strength similar to last assessment last wk.  He has tried to alter his work load and lifting technique.  He is finishing his Prednisone medicine, and verbalizes concerns about pain increasing when it is out of his system.  He tolerated all exercises well, with min to no increase in pain.  Pt making gradual progress towards goals.     Rehab Potential  Good    PT Frequency  2x / week    PT Duration  6 weeks    PT Treatment/Interventions  Patient/family education;ADLs/Self Care Home Management;Cryotherapy;Electrical Stimulation;Iontophoresis 44m/ml Dexamethasone;Dry needling;Manual techniques;Neuromuscular re-education;Therapeutic activities;Therapeutic exercise    PT Next Visit Plan  assess response to (and compliance of) HEP.  add prone thoracic strengthening. continue ionto and manual therapy    Consulted and Agree with Plan of Care  Patient       Patient will benefit from skilled therapeutic intervention in order to improve the following deficits and impairments:  Postural dysfunction, Improper body mechanics, Pain, Increased fascial restricitons, Increased muscle spasms, Decreased range of motion, Decreased strength, Decreased activity tolerance, Impaired UE functional use  Visit Diagnosis: Pain in left elbow  Impaired functional mobility and endurance  Weakness generalized  Other symptoms and signs involving the musculoskeletal system     Problem List Patient Active Problem List   Diagnosis Date Noted  . Right foot pain 12/03/2016  . Pain, neck 12/03/2016  . 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 PTSD (post-traumatic stress disorder) 03/15/2016  . Synovitis of hand 02/24/2016  . Right knee pain 01/26/2016  . Low back pain 08/12/2014  . GAD (generalized anxiety disorder) 09/25/2013  . Insomnia 09/25/2013  . Paresthesia of both hands 09/25/2013  . History of  pneumothorax 04/07/2010  .  TRANSIENT DISORDER INITIATING/MAINTAINING SLEEP 12/02/2007  . OTHER THALASSEMIA 09/30/2007  . PAIN IN THORACIC SPINE 02/12/2007   Kerin Perna, PTA 09/26/17 6:00 PM  [place], [place], [address] Phone: [phone]   Fax:  [phone]  Name: MAHDI FRYE MRN: [id number] Date of Birth: [birth year]

## 2017-10-03 ENCOUNTER — Ambulatory Visit: Payer: BLUE CROSS/BLUE SHIELD | Admitting: Physical Therapy

## 2017-10-03 DIAGNOSIS — R531 Weakness: Secondary | ICD-10-CM

## 2017-10-03 DIAGNOSIS — M25522 Pain in left elbow: Secondary | ICD-10-CM | POA: Diagnosis not present

## 2017-10-03 DIAGNOSIS — R29898 Other symptoms and signs involving the musculoskeletal system: Secondary | ICD-10-CM

## 2017-10-03 DIAGNOSIS — Z7409 Other reduced mobility: Secondary | ICD-10-CM | POA: Diagnosis not present

## 2017-10-03 NOTE — Therapy (Signed)
[place], [place], [address] Phone: [phone]   Fax:  [phone]  Physical Therapy Treatment  Patient Details  Name: Raymond [name] MRN: [id number] Date of Birth: [birth year] Referring Provider: Dr. Emeterio Reeve   Encounter Date: 10/03/2017  PT End of Session - 10/03/17 1544    Visit Number  5    Number of Visits  12    Date for PT Re-Evaluation  10/10/17    PT Start Time  8315    PT Stop Time  1616    PT Time Calculation (min)  38 min       Past Medical History:  Diagnosis Date  . Spontaneous pneumothorax    Two in the past 2011  . WEIGHT LOSS, ABNORMAL 09/30/2007   Qualifier: Diagnosis of  By: Esmeralda Arthur      Past Surgical History:  Procedure Laterality Date  . chest tubes    . INGUINAL HERNIA REPAIR Left 10/12/13    There were no vitals filed for this visit.  Subjective Assessment - 10/03/17 1542    Subjective  Pt reports he is no longer taking Prednisone, but is taking Mobic.  His work load continues to be heavy.  His Lt feels worse than Rt today.     Patient Stated Goals  relieve pain in elbows    Currently in Pain?  Yes    Pain Score  -- 6/10 in Lt; 2/10 in Rt.     Pain Location  Elbow    Pain Orientation  Right;Left    Pain Descriptors / Indicators  Aching    Aggravating Factors   lifting with palm down.     Pain Relieving Factors  ice, medication, stretches.          [facility] PT Assessment - 10/03/17 0001      Assessment   Medical Diagnosis  Lt/Rt lateral epicondylitis     Referring Provider  Dr. Emeterio Reeve    Onset Date/Surgical Date  04/06/17    Hand Dominance  Right    Next MD Visit  PRN       Strength   Right Hand Grip (lbs)  92,100    Left Hand Grip (lbs)  80,86       [facility] Adult PT Treatment/Exercise - 10/03/17 0001      Shoulder Exercises: Standing   External Rotation  Strengthening;Both;10 reps;Theraband 2 sets    Theraband Level  (Shoulder External Rotation)  Level 3 (Green)    Other Standing Exercises  Rt/Lt elbow ext with green band x 10 reps, eccentric control.       Wrist Exercises   Other wrist exercises  cross over wrist pronation and flexion stretch x 60 sec x 4 reps each arm.   Wrist flex/ ext stretches 45 sec x 3 reps each arm .     Other wrist exercises  resisted finger ext x 10 reps (fingers in rubber glove) x 2 sets each hand.       Modalities   Modalities  Electrical Stimulation;Iontophoresis;Ultrasound      Theme park manager  bilat lateral epicondyle musculature    Chartered certified accountant  combo [place]    Electrical Stimulation Parameters  to tolerance     Electrical Stimulation Goals  Pain      Ultrasound   Ultrasound Location  bilat lateral epicondyle musculature    Ultrasound Parameters  50%, 1.0 mHz, 10 min total time- with  combo .     Ultrasound Goals  Edema;Pain      Iontophoresis   Type of Iontophoresis  Dexamethasone    Location  bilat lateral epicondyle tissue     Dose  1.0 cc each arm     Time  158m,12 hr patch                   PT Long Term Goals - 09/26/17 1623      PT LONG TERM GOAL #1   Title  Patient I in HEP for discharge 10/10/17    Time  6    Period  Weeks    Status  On-going      PT LONG TERM GOAL #2   Title  Decrease pain with active UE ROM to no more than 2-3/10 on 0/10 pain scale 10/10/17    Time  6    Period  Weeks    Status  On-going pain up to 4-5/10 with use.       PT LONG TERM GOAL #3   Title  Improve grip and pinch strength Lt hand to 80# grip; 11# three point pinch and 13# lateral pinch 10/10/17    Period  Weeks    Status  Partially Met      PT LONG TERM GOAL #4   Title  Decrease pain with functional and work tasks by 50-70% per pt report 10/10/17    Time  6    Period  Weeks    Status  On-going      PT LONG TERM GOAL #5   Title  Improve FOTO to </= 29 % limitation 10/10/17    Time  6    Period  Weeks     Status  On-going            Plan - 10/03/17 1553    Clinical Impression Statement  Extensor carpi radialis and extensor digitorum test strong but painful.  Pt demonstrated improved grip strength despite elevated pain level. He tolerated all exercises well.  Progressing towards goals.    Rehab Potential  Good    PT Frequency  2x / week    PT Duration  6 weeks    PT Treatment/Interventions  Patient/family education;ADLs/Self Care Home Management;Cryotherapy;Electrical Stimulation;Iontophoresis 458mml Dexamethasone;Dry needling;Manual techniques;Neuromuscular re-education;Therapeutic activities;Therapeutic exercise    PT Next Visit Plan  assess response to HEP.  add prone thoracic strengthening. continue ionto and manual therapy       Patient will benefit from skilled therapeutic intervention in order to improve the following deficits and impairments:  Postural dysfunction, Improper body mechanics, Pain, Increased fascial restricitons, Increased muscle spasms, Decreased range of motion, Decreased strength, Decreased activity tolerance, Impaired UE functional use  Visit Diagnosis: Pain in left elbow  Impaired functional mobility and endurance  Weakness generalized  Other symptoms and signs involving the musculoskeletal system     Problem List Patient Active Problem List   Diagnosis Date Noted  . Right foot pain 12/03/2016  . Pain, neck 12/03/2016  . 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 PTSD (post-traumatic stress disorder) 03/15/2016  . Synovitis of hand 02/24/2016  . Right knee pain 01/26/2016  . Low back pain 08/12/2014  . GAD (generalized anxiety disorder) 09/25/2013  . Insomnia 09/25/2013  . Paresthesia of both hands 09/25/2013  . History of pneumothorax 04/07/2010  . TRANSIENT DISORDER INITIATING/MAINTAINING SLEEP 12/02/2007  . OTHER THALASSEMIA 09/30/2007  . PAIN IN THORACIC SPINE 02/12/2007   JeKerin PernaPTA 10/03/17 6:12 PM  [place], [place], [address] Phone: [phone]   Fax:  [phone]  Name: Raymond [name] MRN: [id number] Date of Birth: December 01, 1985

## 2017-10-07 ENCOUNTER — Other Ambulatory Visit: Payer: Self-pay | Admitting: *Deleted

## 2017-10-07 DIAGNOSIS — G8929 Other chronic pain: Secondary | ICD-10-CM

## 2017-10-07 DIAGNOSIS — M545 Low back pain, unspecified: Secondary | ICD-10-CM

## 2017-10-08 ENCOUNTER — Other Ambulatory Visit: Payer: Self-pay | Admitting: Family Medicine

## 2017-10-08 DIAGNOSIS — M545 Low back pain: Principal | ICD-10-CM

## 2017-10-08 DIAGNOSIS — G8929 Other chronic pain: Secondary | ICD-10-CM

## 2017-10-08 MED ORDER — PREGABALIN 200 MG PO CAPS: ORAL_CAPSULE | ORAL | 2 refills | Status: DC

## 2017-10-10 ENCOUNTER — Encounter: Payer: Self-pay | Admitting: Physical Therapy

## 2017-10-10 ENCOUNTER — Telehealth: Payer: Self-pay | Admitting: Physical Therapy

## 2017-10-10 NOTE — Telephone Encounter (Signed)
Called patient regarding his missed physical therapy appointment.  He states he had forgotten about appointment but is interested in rescheduling.  He reports he is doing a little better but is interested in continued care.  Appointments made while patient on the phone.    Mayer CamelJennifer Carlson-Long, PTA 10/10/17 4:55 PM

## 2017-10-16 ENCOUNTER — Encounter: Payer: Self-pay | Admitting: Family Medicine

## 2017-10-16 MED ORDER — BUSPIRONE HCL 7.5 MG PO TABS: 7.5000 mg | ORAL_TABLET | Freq: Three times a day (TID) | ORAL | 1 refills | Status: DC | PRN

## 2017-10-17 ENCOUNTER — Ambulatory Visit (INDEPENDENT_AMBULATORY_CARE_PROVIDER_SITE_OTHER): Payer: BLUE CROSS/BLUE SHIELD | Admitting: Physical Therapy

## 2017-10-17 DIAGNOSIS — M25522 Pain in left elbow: Secondary | ICD-10-CM | POA: Diagnosis not present

## 2017-10-17 DIAGNOSIS — R29898 Other symptoms and signs involving the musculoskeletal system: Secondary | ICD-10-CM

## 2017-10-17 DIAGNOSIS — R531 Weakness: Secondary | ICD-10-CM | POA: Diagnosis not present

## 2017-10-17 DIAGNOSIS — Z7409 Other reduced mobility: Secondary | ICD-10-CM

## 2017-10-17 NOTE — Therapy (Signed)
[place], [place], [address] Phone: [phone]   Fax:  [phone]  Physical Therapy Treatment  Patient Details  Name: Raymond [name] MRN: [id number] Date of Birth: 09-Sep-1985 Referring Provider: Dr. Emeterio Reeve   Encounter Date: 10/17/2017  PT End of Session - 10/17/17 1538    Visit Number  6    Number of Visits  12    Date for PT Re-Evaluation  10/10/17    PT Start Time  0175    PT Stop Time  1620    PT Time Calculation (min)  46 min       Past Medical History:  Diagnosis Date  . Spontaneous pneumothorax    Two in the past 2011  . WEIGHT LOSS, ABNORMAL 09/30/2007   Qualifier: Diagnosis of  By: Esmeralda Arthur      Past Surgical History:  Procedure Laterality Date  . chest tubes    . INGUINAL HERNIA REPAIR Left 10/12/13    There were no vitals filed for this visit.  Subjective Assessment - 10/17/17 1538    Subjective  Pt reports his work load has been light over last 2 wks and this has helped.  He feels like things are moving in the right direction.  He has difficulty riding motorcycle and still has significant pain (up to 5/10) wiht work.  He is having a procedure on 11/12/17 for his elbows (unsure what it is yet)    Patient Stated Goals  relieve pain in elbows    Currently in Pain?  No/denies    Pain Score  0-No pain up to 5/10 in Lt, 3/10 in Rt with work.          [facility] PT Assessment - 10/17/17 0001      Assessment   Medical Diagnosis  Lt/Rt lateral epicondylitis     Referring Provider  Dr. Emeterio Reeve    Onset Date/Surgical Date  04/06/17    Hand Dominance  Right    Next MD Visit  PRN       Observation/Other Assessments   Focus on Therapeutic Outcomes (FOTO)   33% limited (no change since visit on 2/21)      Strength   Right Hand Grip (lbs)  105    Right Hand Lateral Pinch  14 lbs    Right Hand 3 Point Pinch  16 lbs    Left Hand Grip (lbs)  102    Left Hand  Lateral Pinch  13 lbs    Left Hand 3 Point Pinch  14 lbs       [facility] Adult PT Treatment/Exercise - 10/17/17 0001      Elbow Exercises   Elbow Extension  Strengthening;Right;Left;10 reps;Standing;Theraband    Theraband Level (Elbow Extension)  Level 3 (Green)      Shoulder Exercises: Standing   External Rotation  Strengthening;Both;10 reps;Theraband 2 sets    Theraband Level (Shoulder External Rotation)  Level 3 (Green)    Other Standing Exercises  tricep ext stretch x 30 sec each arm.      Other Standing Exercises  3 position doorway stretch x 30 sec each.       Shoulder Exercises: ROM/Strengthening   UBE (Upper Arm Bike)  L1: 1 min each direction       Wrist Exercises   Wrist Flexion  Strengthening;Right;Left;20 reps yellow flex bar, bilat    Wrist Extension  Strengthening;Right;Left;20 reps;Seated yellow flex bar, bilat    Other wrist exercises  cross over wrist pronation and flexion stretch x 60 sec x 4 reps each arm.   Wrist flex/ ext stretches 45 sec x 3 reps each arm .       Acupuncturist Location  bilat lateral epicondyle musculature    Electrical Stimulation Action  Combo [place]    Electrical Stimulation Parameters  to tolerance     Electrical Stimulation Goals  Pain      Ultrasound   Ultrasound Location  bilat lateral epicondyle musculature    Ultrasound Parameters  50%, 1.2 w/cm2, 10 min total.    Ultrasound Goals  Edema;Pain      Iontophoresis   Type of Iontophoresis  Dexamethasone    Location  bilat lateral epicondyle tissue     Dose  1.0 cc each arm     Time  141m,12 hr patch                   PT Long Term Goals - 10/17/17 1715      PT LONG TERM GOAL #1   Title  Patient I in HEP for discharge 10/10/17    Time  6    Period  Weeks    Status  On-going      PT LONG TERM GOAL #2   Title  Decrease pain with active UE ROM to no more than 2-3/10 on 0/10 pain scale 10/10/17    Time  6    Period  Weeks    Status  On-going  4-5/10 pain with activity      PT LONG TERM GOAL #3   Title  Improve grip and pinch strength Lt hand to 80# grip; 11# three point pinch and 13# lateral pinch 10/10/17    Time  6    Period  Weeks    Status  Achieved      PT LONG TERM GOAL #4   Title  Decrease pain with functional and work tasks by 50-70% per pt report 10/10/17    Time  6    Period  Weeks    Status  Partially Met      PT LONG TERM GOAL #5   Title  Improve FOTO to </= 29 % limitation 10/10/17    Time  6    Period  Weeks    Status  On-going            Plan - 10/17/17 1716    Clinical Impression Statement  Pt demonstrated improved grip strength in Lt hand; has met LTG#3.  He tolerated all exercises well, with min increase in pain.  He is making gradual progress towards goals and will benefit from continuation of therapy to improve functional mobility.     Rehab Potential  Good    PT Frequency  2x / week    PT Duration  6 weeks    PT Treatment/Interventions  Patient/family education;ADLs/Self Care Home Management;Cryotherapy;Electrical Stimulation;Iontophoresis 419mml Dexamethasone;Dry needling;Manual techniques;Neuromuscular re-education;Therapeutic activities;Therapeutic exercise    PT Next Visit Plan  spoke to supervising PT; will request additional visits and continue postural and UE strengthening.     Consulted and Agree with Plan of Care  Patient       Patient will benefit from skilled therapeutic intervention in order to improve the following deficits and impairments:  Postural dysfunction, Improper body mechanics, Pain, Increased fascial restricitons, Increased muscle spasms, Decreased range of motion, Decreased strength, Decreased activity tolerance, Impaired UE functional use  Visit Diagnosis: Pain in left elbow  Impaired functional mobility and endurance  Weakness generalized  Other symptoms and signs involving the musculoskeletal system     Problem List Patient Active Problem List   Diagnosis  Date Noted  . Right foot pain 12/03/2016  . Pain, neck 12/03/2016  . 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 PTSD (post-traumatic stress disorder) 03/15/2016  . Synovitis of hand 02/24/2016  . Right knee pain 01/26/2016  . Low back pain 08/12/2014  . GAD (generalized anxiety disorder) 09/25/2013  . Insomnia 09/25/2013  . Paresthesia of both hands 09/25/2013  . History of pneumothorax 04/07/2010  . TRANSIENT DISORDER INITIATING/MAINTAINING SLEEP 12/02/2007  . OTHER THALASSEMIA 09/30/2007  . PAIN IN THORACIC SPINE 02/12/2007   Kerin Perna, PTA 10/17/17 5:18 PM  Celyn P. Helene Kelp PT, MPH 10/17/17 6:39 PM   [facility] [place], [place], [address] Phone: [phone]   Fax:  [phone]  Name: Raymond [name] MRN: [id number] Date of Birth: February 16, 1986

## 2017-10-24 ENCOUNTER — Ambulatory Visit (INDEPENDENT_AMBULATORY_CARE_PROVIDER_SITE_OTHER): Payer: BLUE CROSS/BLUE SHIELD | Admitting: Physical Therapy

## 2017-10-24 ENCOUNTER — Encounter: Payer: Self-pay | Admitting: Physical Therapy

## 2017-10-24 DIAGNOSIS — R29898 Other symptoms and signs involving the musculoskeletal system: Secondary | ICD-10-CM | POA: Diagnosis not present

## 2017-10-24 DIAGNOSIS — M25522 Pain in left elbow: Secondary | ICD-10-CM

## 2017-10-24 DIAGNOSIS — Z7409 Other reduced mobility: Secondary | ICD-10-CM | POA: Diagnosis not present

## 2017-10-24 DIAGNOSIS — R531 Weakness: Secondary | ICD-10-CM

## 2017-10-24 NOTE — Therapy (Addendum)
[place], [place], [address] Phone: [phone]   Fax:  [phone]  Physical Therapy Treatment  Patient Details  Name: Raymond [name] MRN: [id number] Date of Birth: 08/10/1985 Referring Provider: Dr. Emeterio Reeve   Encounter Date: 10/24/2017  PT End of Session - 10/24/17 1632    Visit Number  7    Number of Visits  18    Date for PT Re-Evaluation  11/30/17    PT Start Time  1628 pt arrived late    PT Stop Time  1703    PT Time Calculation (min)  35 min    Activity Tolerance  Patient tolerated treatment well    Behavior During Therapy  [facility] for tasks assessed/performed       Past Medical History:  Diagnosis Date  . Spontaneous pneumothorax    Two in the past 2011  . WEIGHT LOSS, ABNORMAL 09/30/2007   Qualifier: Diagnosis of  By: Esmeralda Arthur      Past Surgical History:  Procedure Laterality Date  . chest tubes    . INGUINAL HERNIA REPAIR Left 10/12/13    There were no vitals filed for this visit.  Subjective Assessment - 10/24/17 1633    Subjective  "It's been a rough week at work".  He overslept from a nap and arrived late.  He reports that his elbows aren't as painful when riding motorcycle (up to 2/10). He has tried to change body mechanics at work.  His Rt elbow is not bothering him much anymore.    Currently in Pain?  Yes    Pain Score  3     Pain Location  Elbow    Pain Orientation  Left    Aggravating Factors   lifting with palm down    Pain Relieving Factors  ice, medication, stretches.          [facility] PT Assessment - 10/24/17 0001      Assessment   Medical Diagnosis  Lt/Rt lateral epicondylitis     Referring Provider  Dr. Emeterio Reeve    Onset Date/Surgical Date  04/06/17    Hand Dominance  Right    Next MD Visit  PRN         [facility] Adult PT Treatment/Exercise - 10/24/17 0001      Shoulder Exercises: Standing   Other Standing Exercises  tricep ext with 3  plates on gym equip x 15 reps; tricep ext stretch x 30 sec each arm.      Other Standing Exercises  3 position doorway stretch x 30 sec each.       Shoulder Exercises: ROM/Strengthening   UBE (Upper Arm Bike)  L2: 1 min each direction       Wrist Exercises   Wrist Flexion  Strengthening;Right;Left;15 reps;Seated;Bar weights/barbell    Bar Weights/Barbell (Wrist Flexion)  3 lbs    Wrist Extension  Strengthening;Right;Left;15 reps;Seated;Bar weights/barbell    Bar Weights/Barbell (Wrist Extension)  3 lbs    Wrist Radial Deviation  Strengthening;Right;Left;10 reps;Seated;Bar weights/barbell    Bar Weights/Barbell (Radial Deviation)  3 lbs    Other wrist exercises  cross over wrist pronation and flexion stretch x 60 sec x 2 reps each arm.  2 sets      Acupuncturist Location  Lt  lateral epicondyle musculature    Electrical Stimulation Action  combo [place]    Electrical Stimulation Parameters  to tolerance    Electrical Stimulation Goals  Pain;Tone      Ultrasound   Ultrasound Location  Lt prox wrist ext     Ultrasound Parameters  100%, 1.3 w/cm2, 52mn     Ultrasound Goals  Pain      Iontophoresis   Type of Iontophoresis  Dexamethasone    Location  Lt lateral epicondyle    Dose  1.0 cc     Time  1274m12 hr patch       Manual Therapy   Soft tissue mobilization  STM to Lt wrist ext, lateral distal tricep                  PT Long Term Goals - 10/17/17 1715      PT LONG TERM GOAL #1   Title  Patient I in HEP for discharge 11/30/17    Time  12    Period  Weeks    Status  Revised      PT LONG TERM GOAL #2   Title  Decrease pain with active UE ROM to no more than 2-3/10 on 0/10 pain scale 11/30/17    Time  12    Period  Weeks    Status  Revised 4-5/10 pain with activity      PT LONG TERM GOAL #3   Title  Improve grip and pinch strength Lt hand to 80# grip; 11# three point pinch and 13# lateral pinch 10/10/17    Time  6    Period  Weeks     Status  Achieved      PT LONG TERM GOAL #4   Title  Decrease pain with functional and work tasks by 50-70% per pt report 11/30/17    Time  12    Period  Weeks    Status  Revised      PT LONG TERM GOAL #5   Title  Improve FOTO to </= 29 % limitation 11/30/17    Time  12    Period  Weeks    Status  Revised      PT LONG TERM GOAL #6   Title  Patient reports that he can ride his motorcycle with less pain </= 2/10 on 0-10 pain scale 11/30/17    Time  6    Period  Weeks    Status  New            Plan - 10/24/17 1758    Clinical Impression Statement  Pt reporting reduced symptoms in Lt elbow.  Less palpable tightness in Lt forearm.  Continued tenderness in Lt lateral epicondyle musculature.  Pt making progress towards remaining goals.     Rehab Potential  Good    PT Frequency  2x / week    PT Duration  6 weeks    PT Treatment/Interventions  Patient/family education;ADLs/Self Care Home Management;Cryotherapy;Electrical Stimulation;Iontophoresis '4mg'$ /ml Dexamethasone;Dry needling;Manual techniques;Neuromuscular re-education;Therapeutic activities;Therapeutic exercise    PT Next Visit Plan  continue postural and UE strengthening.     Consulted and Agree with Plan of Care  Patient       Patient will benefit from skilled therapeutic intervention in order to improve the following deficits and impairments:  Postural dysfunction, Improper body mechanics, Pain, Increased fascial restricitons, Increased muscle spasms, Decreased range of motion, Decreased strength, Decreased activity tolerance, Impaired UE functional use  Visit Diagnosis: Pain in left elbow  Impaired functional mobility and endurance  Weakness generalized  Other symptoms and signs involving the musculoskeletal system     Problem List Patient Active Problem List  Diagnosis Date Noted  . Right foot pain 12/03/2016  . Pain, neck 12/03/2016  . 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 PTSD (post-traumatic stress  disorder) 03/15/2016  . Synovitis of hand 02/24/2016  . Right knee pain 01/26/2016  . Low back pain 08/12/2014  . GAD (generalized anxiety disorder) 09/25/2013  . Insomnia 09/25/2013  . Paresthesia of both hands 09/25/2013  . History of pneumothorax 04/07/2010  . TRANSIENT DISORDER INITIATING/MAINTAINING SLEEP 12/02/2007  . OTHER THALASSEMIA 09/30/2007  . PAIN IN THORACIC SPINE 02/12/2007   Kerin Perna, PTA 10/24/17 6:05 PM  [place], [place], [address] Phone: [phone]   Fax:  [phone]  Name: REILY ILIC MRN: [id number] Date of Birth: 1986-06-01  PHYSICAL THERAPY DISCHARGE SUMMARY  Visits from Start of Care: 7  Current functional level related to goals / functional outcomes: See last progress note for discharge status    Remaining deficits: intermittent symptoms related to [employer]   Education / Equipment: HEP Plan: Patient agrees to discharge.  Patient goals were partially met. Patient is being discharged due to being pleased with the current functional level.  ?????    Celyn P. Helene Kelp PT, MPH 11/26/17 10:12 AM

## 2017-10-31 ENCOUNTER — Encounter: Payer: Self-pay | Admitting: Physical Therapy

## 2017-12-24 ENCOUNTER — Encounter: Payer: Self-pay | Admitting: Family Medicine

## 2017-12-24 ENCOUNTER — Ambulatory Visit: Payer: BLUE CROSS/BLUE SHIELD | Admitting: Family Medicine

## 2017-12-24 VITALS — Ht 67.0 in

## 2017-12-24 DIAGNOSIS — G8929 Other chronic pain: Secondary | ICD-10-CM

## 2017-12-24 DIAGNOSIS — F411 Generalized anxiety disorder: Secondary | ICD-10-CM

## 2017-12-24 DIAGNOSIS — F5101 Primary insomnia: Secondary | ICD-10-CM

## 2017-12-24 DIAGNOSIS — M25521 Pain in right elbow: Secondary | ICD-10-CM | POA: Diagnosis not present

## 2017-12-24 MED ORDER — BUSPIRONE HCL 10 MG PO TABS: 10.0000 mg | ORAL_TABLET | Freq: Three times a day (TID) | ORAL | 5 refills | Status: DC

## 2017-12-24 MED ORDER — ESZOPICLONE 3 MG PO TABS: 3.0000 mg | ORAL_TABLET | Freq: Every day | ORAL | 2 refills | Status: DC

## 2017-12-24 NOTE — Progress Notes (Signed)
Subjective:    Patient ID: Raymond [name], male    DOB: 06-06-1986, 32 y.o.   MRN: [id number]  HPI 32 year old male comes in today to follow-up on his anxiety.  He does feel like he is noticed some improvement after the increase the Paxil to 30 mg.  He still taking the buspirone 7.5.  Most days he is able to get it in twice a day but very rarely takes a third dose.  Is occasionally miss a dose of Paxil may be every couple of weeks.  He does feel it gets help but says he still feels like he struggles.  He says the people at work seem more relaxed and laid back and not as worried and he seems to worry very much so about things like timeliness and getting the job done properly.  He is also being followed at [facility] for chronic elbow pain.  He recently had an ulnar nerve injection and unfortunately did not get relief.  He is following up around June 1 and they are discussing possibly doing another injection in the elbow.  He is currently on Belbuca.  He says is quite costly to see them and wanted to know if I would be willing to take over his prescription at some point.  Follow-up insomnia-back on Lunesta and doing well.   Review of Systems  Ht  (1.702 m)   BMI 25.84 kg/m     Allergies  Allergen Reactions  . Baclofen Nausea Only  . Cyclobenzaprine Other (See Comments)    Dry mouth    Past Medical History:  Diagnosis Date  . Spontaneous pneumothorax    Two in the past 2011  . WEIGHT LOSS, ABNORMAL 09/30/2007   Qualifier: Diagnosis of  By: Thomos Lemons      Past Surgical History:  Procedure Laterality Date  . chest tubes    . INGUINAL HERNIA REPAIR Left 10/12/13    Social History   Socioeconomic History  . Marital status: Single    Spouse name: Not on file  . Number of children: Not on file  . Years of education: Not on file  . Highest education level: Not on file  Occupational History  . Occupation: Work at [employer]  . Financial resource  strain: Not on file  . Food insecurity:    Worry: Not on file    Inability: Not on file  . Transportation needs:    Medical: Not on file    Non-medical: Not on file  Tobacco Use  . Smoking status: Former Smoker    Packs/day: 0.75    Types: E-cigarettes    Last attempt to quit: 08/06/2014    Years since quitting: 3.3  . Smokeless tobacco: Never Used  Substance and Sexual Activity  . Alcohol use: No    Alcohol/week: 0.0 oz    Comment: Stopped Drinking in December. Was drinking 2-3 beers a day.  . 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 6th Caffeine: Coffee 2 cups/pots per day.   Marland Kitchen Sexual activity: Yes    Partners: Female    Comment: No birth control  Lifestyle  . Physical activity:    Days per week: Not on file    Minutes per session: Not on file  . Stress: Not on file  Relationships  . Social connections:    Talks on phone: Not on file    Gets together: Not on file    Attends  religious service: Not on file    Active member of club or organization: Not on file    Attends meetings of clubs or organizations: Not on file    Relationship status: Not on file  . Intimate partner violence:    Fear of current or ex partner: Not on file    Emotionally abused: Not on file    Physically abused: Not on file    Forced sexual activity: Not on file  Other Topics Concern  . Not on file  Social History Narrative  . Not on file    Family History  Problem Relation Age of Onset  . Hypertension Father   . Anxiety disorder Mother   . Depression Mother   . Drug abuse Brother   . Coronary artery disease Maternal Grandfather   . ADD / ADHD Neg Hx   . Alcohol abuse Neg Hx   . Bipolar disorder Neg Hx   . Dementia Neg Hx   . OCD Neg Hx   . Paranoid behavior Neg Hx   . Schizophrenia Neg Hx   . Thyroid disease Neg Hx     Outpatient Encounter Medications as of 12/24/2017  Medication Sig  . Buprenorphine HCl (BELBUCA) 300 MCG FILM Place 300 mcg inside cheek every 12  (twelve) hours.  . busPIRone (BUSPAR) 10 MG tablet Take 1 tablet (10 mg total) by mouth 3 (three) times daily.  . diclofenac sodium (VOLTAREN) 1 % GEL Apply 4 g topically 4 (four) times daily. To affected joint.  . Eszopiclone 3 MG TABS Take 1 tablet (3 mg total) by mouth at bedtime. Take immediately before bedtime  . PARoxetine (PAXIL) 30 MG tablet Take 1 tablet (30 mg total) by mouth daily.  . pregabalin (LYRICA) 200 MG capsule TAKE 1 CAPSULE BY MOUTH THREE TIMES DAILY  . [DISCONTINUED] busPIRone (BUSPAR) 7.5 MG tablet Take 1 tablet (7.5 mg total) by mouth 3 (three) times daily as needed.  . [DISCONTINUED] Eszopiclone 3 MG TABS Take 1 tablet (3 mg total) by mouth at bedtime. Take immediately before bedtime  . [DISCONTINUED] celecoxib (CELEBREX) 100 MG capsule Take 1 capsule (100 mg total) by mouth 2 (two) times daily.  . [DISCONTINUED] sertraline (ZOLOFT) 100 MG tablet Take 1 tablet (100 mg total) by mouth daily. (Patient not taking: Reported on 03/06/2016)   No facility-administered encounter medications on file as of 12/24/2017.          Objective:   Physical Exam  Constitutional: He is oriented to person, place, and time. He appears well-developed and well-nourished.  HENT:  Head: Normocephalic and atraumatic.  TMs and canals are clear.   Eyes: Pupils are equal, round, and reactive to light. EOM are normal.  Neck: Neck supple. No thyromegaly present.  Cardiovascular: Normal rate and normal heart sounds.  Pulmonary/Chest: Effort normal and breath sounds normal.  Lymphadenopathy:    He has no cervical adenopathy.  Neurological: He is alert and oriented to person, place, and time.  Skin: Skin is warm and dry.  Psychiatric: He has a normal mood and affect.        Assessment & Plan:  Generalized anxiety disorder-discussed options.  Will continue with Paxil 30 mg daily and increase his BuSpar to 10 mg.  I did write for 3 times daily knowing that he will most likely just take it twice  daily.  New prescription sent to pharmacy.  Chronic elbow pain-following with [facility].  I do not write Belbuca so I will not be  able to take over his pain regimen for him.  He is scheduled for follow-up and likely will try another injection.  Insomnia-continue Lunesta.

## 2017-12-27 ENCOUNTER — Encounter: Payer: Self-pay | Admitting: Family Medicine

## 2017-12-27 DIAGNOSIS — M25521 Pain in right elbow: Principal | ICD-10-CM

## 2017-12-27 DIAGNOSIS — G8929 Other chronic pain: Secondary | ICD-10-CM

## 2017-12-31 ENCOUNTER — Other Ambulatory Visit: Payer: Self-pay | Admitting: Family Medicine

## 2017-12-31 DIAGNOSIS — M545 Low back pain: Principal | ICD-10-CM

## 2017-12-31 DIAGNOSIS — G8929 Other chronic pain: Secondary | ICD-10-CM

## 2017-12-31 NOTE — Telephone Encounter (Signed)
[facility] requesting RF on Lyrica.  Last RX sent 10-08-17 for 30 day supply with 2 RF.  RX pended, please send if appropriate

## 2018-01-07 ENCOUNTER — Ambulatory Visit (HOSPITAL_COMMUNITY): Payer: Self-pay | Admitting: Psychiatry

## 2018-01-09 ENCOUNTER — Ambulatory Visit (HOSPITAL_COMMUNITY): Payer: BLUE CROSS/BLUE SHIELD | Admitting: Psychiatry

## 2018-03-09 ENCOUNTER — Other Ambulatory Visit: Payer: Self-pay | Admitting: Family Medicine

## 2018-03-14 ENCOUNTER — Other Ambulatory Visit: Payer: Self-pay | Admitting: Family Medicine

## 2018-03-14 DIAGNOSIS — G8929 Other chronic pain: Secondary | ICD-10-CM

## 2018-03-14 DIAGNOSIS — M545 Low back pain: Principal | ICD-10-CM

## 2018-03-14 NOTE — Telephone Encounter (Signed)
He is already on Lyrica.

## 2018-03-26 ENCOUNTER — Other Ambulatory Visit: Payer: Self-pay | Admitting: Family Medicine

## 2018-03-31 ENCOUNTER — Other Ambulatory Visit: Payer: Self-pay | Admitting: Family Medicine

## 2018-03-31 DIAGNOSIS — G8929 Other chronic pain: Secondary | ICD-10-CM

## 2018-03-31 DIAGNOSIS — M545 Low back pain: Principal | ICD-10-CM

## 2018-03-31 NOTE — Telephone Encounter (Signed)
Routed to pcp for signature.Wyolene Weimann [name], CMA  

## 2018-04-30 ENCOUNTER — Other Ambulatory Visit: Payer: Self-pay | Admitting: Family Medicine

## 2018-04-30 DIAGNOSIS — M545 Low back pain: Principal | ICD-10-CM

## 2018-04-30 DIAGNOSIS — G8929 Other chronic pain: Secondary | ICD-10-CM

## 2018-06-24 ENCOUNTER — Ambulatory Visit (INDEPENDENT_AMBULATORY_CARE_PROVIDER_SITE_OTHER): Payer: BLUE CROSS/BLUE SHIELD | Admitting: Family Medicine

## 2018-06-24 ENCOUNTER — Encounter: Payer: Self-pay | Admitting: Family Medicine

## 2018-06-24 VITALS — BP 150/97 | HR 97 | Ht 67.32 in | Wt 151.0 lb

## 2018-06-24 DIAGNOSIS — M542 Cervicalgia: Secondary | ICD-10-CM | POA: Diagnosis not present

## 2018-06-24 DIAGNOSIS — G4726 Circadian rhythm sleep disorder, shift work type: Secondary | ICD-10-CM | POA: Diagnosis not present

## 2018-06-24 DIAGNOSIS — M79601 Pain in right arm: Secondary | ICD-10-CM

## 2018-06-24 DIAGNOSIS — M79602 Pain in left arm: Secondary | ICD-10-CM

## 2018-06-24 MED ORDER — PREDNISONE 20 MG PO TABS: 40.0000 mg | ORAL_TABLET | Freq: Every day | ORAL | 0 refills | Status: DC

## 2018-06-24 MED ORDER — CYCLOBENZAPRINE HCL 10 MG PO TABS: 10.0000 mg | ORAL_TABLET | Freq: Every evening | ORAL | 0 refills | Status: DC | PRN

## 2018-06-24 NOTE — Patient Instructions (Signed)
Please schedule an appointment with Dr. Denyse Amassorey for him to further work-up your neck pain.
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
## 2018-06-25 ENCOUNTER — Other Ambulatory Visit: Payer: Self-pay | Admitting: Family Medicine

## 2018-06-25 DIAGNOSIS — F5101 Primary insomnia: Secondary | ICD-10-CM

## 2018-06-25 NOTE — Telephone Encounter (Signed)
Routing to pcp for signature.Laquon Emel [name], CMA  

## 2018-06-26 ENCOUNTER — Ambulatory Visit: Payer: Self-pay | Admitting: Family Medicine

## 2018-07-01 ENCOUNTER — Encounter: Payer: Self-pay | Admitting: Family Medicine

## 2018-07-01 ENCOUNTER — Ambulatory Visit: Payer: BLUE CROSS/BLUE SHIELD | Admitting: Family Medicine

## 2018-07-01 DIAGNOSIS — M542 Cervicalgia: Secondary | ICD-10-CM | POA: Diagnosis not present

## 2018-07-01 NOTE — Patient Instructions (Signed)
Thank you for coming in today.  You should hear about MRI soonish.  Recheck with me a few days after MRI to discuss results and plan in detail.   Let me know if you do not hear anything.
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
## 2018-07-07 ENCOUNTER — Ambulatory Visit (INDEPENDENT_AMBULATORY_CARE_PROVIDER_SITE_OTHER): Payer: BLUE CROSS/BLUE SHIELD

## 2018-07-07 DIAGNOSIS — M542 Cervicalgia: Secondary | ICD-10-CM

## 2018-07-07 DIAGNOSIS — M5031 Other cervical disc degeneration,  high cervical region: Secondary | ICD-10-CM | POA: Diagnosis not present

## 2018-07-09 ENCOUNTER — Telehealth: Payer: Self-pay | Admitting: *Deleted

## 2018-07-09 NOTE — Telephone Encounter (Signed)
Called pt and informed him that his forms have been completed. He asked that they be placed up front and he will come by to p/u.  Copy made and scanned in pt's chart.Laureen Ochs.Rhylei Mcquaig, Viann Shoveonya [name], CMA

## 2018-07-15 ENCOUNTER — Ambulatory Visit (INDEPENDENT_AMBULATORY_CARE_PROVIDER_SITE_OTHER): Payer: BLUE CROSS/BLUE SHIELD | Admitting: Family Medicine

## 2018-07-15 VITALS — BP 144/69 | HR 94 | Wt 155.0 lb

## 2018-07-15 DIAGNOSIS — M542 Cervicalgia: Secondary | ICD-10-CM

## 2018-07-15 MED ORDER — METAXALONE 800 MG PO TABS: 800.0000 mg | ORAL_TABLET | Freq: Three times a day (TID) | ORAL | 3 refills | Status: DC | PRN

## 2018-07-15 MED ORDER — NORTRIPTYLINE HCL 25 MG PO CAPS: 25.0000 mg | ORAL_CAPSULE | Freq: Every evening | ORAL | 1 refills | Status: DC | PRN

## 2018-07-15 NOTE — Patient Instructions (Addendum)
Thank you for coming in today. You should hear from Dr Shayne Alken office soon.  I think you may benefit from injection in the cervical facet joints.   This may also be occipital neuralgia.   Let me know if do not hear from his office.   Try sxelaxin for muscle spasms.  Use nortriptyline at bedtime as needed for pain.     Facet Joint Block The facet joints connect the bones of the spine (vertebrae). They make it possible for you to bend, twist, and make other movements with your spine. They also keep you from bending too far, twisting too far, and making other excessive movements. A facet joint block is a procedure where a numbing medicine (anesthetic) is injected into a facet joint. Often, a type of anti-inflammatory medicine called a steroid is also injected. A facet joint block may be done to diagnose neck or back pain. If the pain gets better after a facet joint block, it means the pain is probably coming from the facet joint. If the pain does not get better, it means the pain is probably not coming from the facet joint. A facet joint block may also be done to relieve neck or back pain caused by an inflamed facet joint. A facet joint block is only done to relieve pain if the pain does not improve with other methods, such as medicine, exercise programs, and physical therapy. Tell a health care provider about:  Any allergies you have.  All medicines you are taking, including vitamins, herbs, eye drops, creams, and over-the-counter medicines.  Any problems you or family members have had with anesthetic medicines.  Any blood disorders you have.  Any surgeries you have had.  Any medical conditions you have.  Whether you are pregnant or may be pregnant. What are the risks? Generally, this is a safe procedure. However, problems may occur, including:  Bleeding.  Injury to a nerve near the injection site.  Pain at the injection site.  Weakness or numbness in areas controlled by  nerves near the injection site.  Infection.  Temporary fluid retention.  Allergic reactions to medicines or dyes.  Injury to other structures or organs near the injection site.  What happens before the procedure?  Follow instructions from your health care provider about eating or drinking restrictions.  Ask your health care provider about: ? Changing or stopping your regular medicines. This is especially important if you are taking diabetes medicines or blood thinners. ? Taking medicines such as aspirin and ibuprofen. These medicines can thin your blood. Do not take these medicines before your procedure if your health care provider instructs you not to.  Do not take any new dietary supplements or medicines without asking your health care provider first.  Plan to have someone take you home after the procedure. What happens during the procedure?  You may need to remove your clothing and dress in an open-back gown.  The procedure will be done while you are lying on an X-ray table. You will most likely be asked to lie on your stomach, but you may be asked to lie in a different position if an injection will be made in your neck.  Machines will be used to monitor your oxygen levels, heart rate, and blood pressure.  If an injection will be made in your neck, an IV tube will be inserted into one of your veins. Fluids and medicine will flow directly into your body through the IV tube.  The area over the  facet joint where the injection will be made will be cleaned with soap. The surrounding skin will be covered with clean drapes.  A numbing medicine (local anesthetic) will be applied to your skin. Your skin may sting or burn for a moment.  A video X-ray machine (fluoroscopy) will be used to locate the joint. In some cases, a CT scan may be used.  A contrast dye may be injected into the facet joint area to help locate the joint.  When the joint is located, an anesthetic will be injected  into the joint through the needle.  Your health care provider will ask you whether you feel pain relief. If you do feel relief, a steroid may be injected to provide pain relief for a longer period of time. If you do not feel relief or feel only partial relief, additional injections of an anesthetic may be made in other facet joints.  The needle will be removed.  Your skin will be cleaned.  A bandage (dressing) will be applied over each injection site. The procedure may vary among health care providers and hospitals. What happens after the procedure?  You will be observed for 15-30 minutes before being allowed to go home. This information is not intended to replace advice given to you by your health care provider. Make sure you discuss any questions you have with your health care provider. Document Released: 12/12/2006 Document Revised: 08/24/2015 Document Reviewed: 04/18/2015 Elsevier Interactive Patient Education  2018 [employer]   Occipital Neuralgia Occipital neuralgia is a type of headache that causes episodes of very bad pain in the back of your head. Pain from occipital neuralgia may spread (radiate) to other parts of your head. The pain is usually brief and often goes away after you rest and relax. These headaches may be caused by irritation of the nerves that leave your spinal cord high up in your neck, just below the base of your skull (occipital nerves). Your occipital nerves transmit sensations from the back of your head, the top of your head, and the areas behind your ears. What are the causes? Occipital neuralgia can occur without any known cause (primary headache syndrome). In other cases, occipital neuralgia is caused by pressure on or irritation of one of the two occipital nerves. Causes of occipital nerve compression or irritation include:  Wear and tear of the vertebrae in the neck (osteoarthritis).  Neck injury.  Disease of the disks that separate the  vertebrae.  Tumors.  Gout.  Infections.  Diabetes.  Swollen blood vessels that put pressure on the occipital nerves.  Muscle spasm in the neck.  What are the signs or symptoms? Pain is the main symptom of occipital neuralgia. It usually starts in the back of the head but may also be felt in other areas supplied by the occipital nerves. Pain is usually on one side but may be on both sides. You may have:  Brief episodes of very bad pain that is burning, stabbing, shocking, or shooting.  Pain behind the eye.  Pain triggered by neck movement or hair brushing.  Scalp tenderness.  Aching in the back of the head between episodes of very bad pain.  How is this diagnosed? Your health care provider may diagnose occipital neuralgia based on your symptoms and a physical exam. During the exam, the health care provider may push on areas supplied by the occipital nerves to see if they are painful. Some tests may also be done to help in making the diagnosis. These  may include:  Imaging studies of the upper spinal cord, such as an MRI or CT scan. These may show compression or spinal cord abnormalities.  Nerve block. You will get an injection of numbing medicine (local anesthetic) near the occipital nerve to see if this relieves pain.  How is this treated? Treatment may begin with simple measures, such as:  Rest.  Massage.  Heat.  Over-the-counter pain relievers.  If these measures do not work, you may need other treatments, including:  Medicines such as: ? Prescription-strength anti-inflammatory medicines. ? Muscle relaxants. ? Antiseizure medicines. ? Antidepressants.  Steroid injection. This involves injections of local anesthetic and strong anti-inflammatory drugs (steroids).  Pulsed radiofrequency. Wires are implanted to deliver electrical impulses that block pain signals from the occipital nerve.  Physical therapy.  Surgery to relieve nerve pressure.  Follow these  instructions at home:  Take all medicines as directed by your health care provider.  Avoid activities that cause pain.  Rest when you have an attack of pain.  Try gentle massage or a heating pad to relieve pain.  Work with a physical therapist to learn stretching exercises you can do at home.  Try a different pillow or sleeping position.  Practice good posture.  Try to stay active. Get regular exercise that does not cause pain. Ask your health care provider to suggest safe exercises for you.  Keep all follow-up visits as directed by your health care provider. This is important. Contact a health care provider if:  Your medicine is not working.  You have new or worsening symptoms. Get help right away if:  You have very bad head pain that is not going away.  You have a sudden change in vision, balance, or speech. This information is not intended to replace advice given to you by your health care provider. Make sure you discuss any questions you have with your health care provider. Document Released: 07/17/2001 Document Revised: 12/29/2015 Document Reviewed: 07/15/2013 Elsevier Interactive Patient Education  2017 [employer]

## 2018-07-15 NOTE — Progress Notes (Signed)
Raymond [name] is a 32 y.o. male who presents to [facility] today for right posterior neck pain and posterior scalp pain/headache.  Patient has had ongoing neck and posterior head scalp pain for months.  He has had trials of physical therapy in the past which was only marginally helpful.  He notes the pain can be bad at times and does interfere with his sleep sometimes it does interfere with his ability to work at times.  He has been trying over-the-counter medications which been marginally helpful.  Additionally he is tried various muscle relaxers which did not work or were intolerable.  He notes cyclobenzaprine caused significant dry mouth and did not help at all.  Additionally baclofen caused nausea and Robaxin caused nausea as well.  He notes in the past he thinks he may have tried Skelaxin but cannot be sure if it was tolerable or not.  He denies any significant pain radiating down his arms weakness or numbness distally.  No fevers or chills.  He had a recent MRI and is here for follow-up.    ROS:  As above  Exam:  BP (!) 144/69   Pulse 94   Wt 155 lb (70.3 kg)   BMI 23.92 kg/m  General: Well Developed, well nourished, and in no acute distress.  Neuro/Psych: Alert and oriented x3, extra-ocular muscles intact, able to move all 4 extremities, sensation grossly intact. Skin: Warm and dry, no rashes noted.  Respiratory: Not using accessory muscles, speaking in full sentences, trachea midline.  Cardiovascular: Pulses palpable, no extremity edema. Abdomen: Does not appear distended. MSK: C-spine: Nontender to spinal midline.  Tender to palpation right cervical paraspinal musculature.  Decreased cervical motion due to pain.  Upper extremity strength is intact.    Lab and Radiology Results Mr Cervical Spine Wo Contrast  Result Date: 07/07/2018 CLINICAL DATA:  Neck pain and headache, 2 years duration. EXAM: MRI CERVICAL SPINE WITHOUT CONTRAST  TECHNIQUE: Multiplanar, multisequence MR imaging of the cervical spine was performed. No intravenous contrast was administered. COMPARISON:  Radiography 12/03/2016 FINDINGS: Alignment: Mild straightening of the normal cervical lordosis. Vertebrae: Normal Cord: Normal Posterior Fossa, vertebral arteries, paraspinal tissues: Normal Disc levels: No abnormality at the foramen magnum, skull base C1 articulation or C1-C2 articulation to explain cervicogenic headache. C2-3: Mild right-sided uncovertebral prominence. Mild right-sided facet degeneration without edema. No compressive stenosis. C3-4: Mild right-sided uncovertebral prominence. Mild right-sided facet degeneration without edema. Mild right foraminal narrowing. C4-5: Minimal uncovertebral prominence.  No stenosis. C5-6: Minimal disc bulge.  No stenosis. C6-7: Minimal disc bulge.  No stenosis. C7-T1 and T1-T2: Normal. IMPRESSION: No definite cause of cervicogenic headache identified. Mild right-sided uncovertebral prominence and facet degeneration at C2-3 and C3-4, but without joint effusions or marrow edema. Electronically Signed   By: Paulina Fusi M.D.   On: 07/07/2018 11:14   I personally (independently) visualized and performed the interpretation of the images attached in this note.     Assessment and Plan: 32 y.o. male with right neck pain and posterior scalp pain/posterior head pain.  Patient does have evidence of facet mediated pain in his superior cervical spine on exam as well as on MRI.  His post prominent facet is in the right C2-3 facet.  Additionally some of his pain may be occipital neuralgia as well.  He is failing conservative management and I think he be a good candidate for potential facet injection or occipital nerve injection/block.  [facility] proceeding with these issues.  Additionally will prescribe Skelaxin and trial of nortriptyline at bedtime.  Lastly patient notes that his  insurance is changing and [facility].  I spent 25 minutes with this patient, greater than 50% was face-to-face time counseling regarding differential diagnosis and treatment plan and options..   Orders Placed This Encounter  Procedures  . Ambulatory referral to Pain Clinic    Referral Priority:   Routine    Referral Type:   Consultation    Referral Reason:   Specialty Services Required    Referred to Provider:   Gwen HerHamsher, Carlyle P, MD    Requested Specialty:   Pain Medicine    Number of Visits Requested:   1   Meds ordered this encounter  Medications  . metaxalone (SKELAXIN) 800 MG tablet    Sig: Take 1 tablet (800 mg total) by mouth 3 (three) times daily as needed for muscle spasms.    Dispense:  30 tablet    Refill:  3    Tried and failed flexeril, baclofen and robaxin  . nortriptyline (PAMELOR) 25 MG capsule    Sig: Take 1-2 capsules (25-50 mg total) by mouth at bedtime as needed (pain).    Dispense:  60 capsule    Refill:  1    Historical information moved to improve visibility of documentation.  Past Medical History:  Diagnosis Date  . Spontaneous pneumothorax    Two in the past 2011  . WEIGHT LOSS, ABNORMAL 09/30/2007   Qualifier: Diagnosis of  By: Thomos LemonsBowen DO, Karen     Past Surgical History:  Procedure Laterality Date  . chest tubes    . INGUINAL HERNIA REPAIR Left 10/12/13   Social History   Tobacco Use  . Smoking status: Former Smoker    Packs/day: 0.75    Types: E-cigarettes    Last attempt to quit: 08/06/2014    Years since quitting: 3.9  . Smokeless tobacco: Never Used  Substance Use Topics  . Alcohol use: No    Alcohol/week: 0.0 standard drinks    Comment: Stopped Drinking in December. Was drinking 2-3 beers a day.   family history includes Anxiety disorder in his mother; Coronary artery disease in his maternal grandfather; Depression in his mother; Drug abuse in his brother; Hypertension in his  father.  Medications: Current Outpatient Medications  Medication Sig Dispense Refill  . busPIRone (BUSPAR) 10 MG tablet Take 1 tablet (10 mg total) by mouth 3 (three) times daily. 90 tablet 5  . Eszopiclone 3 MG TABS TAKE 1 TABLET BY MOUTH IMMEDIATELY BEFORE BEDTIME 30 tablet 3  . pregabalin (LYRICA) 200 MG capsule TAKE 1 CAPSULE BY MOUTH THREE TIMES DAILY 90 capsule 2  . metaxalone (SKELAXIN) 800 MG tablet Take 1 tablet (800 mg total) by mouth 3 (three) times daily as needed for muscle spasms. 30 tablet 3  . nortriptyline (PAMELOR) 25 MG capsule Take 1-2 capsules (25-50 mg total) by mouth at bedtime as needed (pain). 60 capsule 1   No current facility-administered medications for this visit.    Allergies  Allergen Reactions  . Baclofen Nausea Only  . Cyclobenzaprine Other (See Comments)    Dry mouth      Discussed warning signs or symptoms. Please see discharge instructions. Patient expresses understanding.

## 2018-07-23 ENCOUNTER — Other Ambulatory Visit: Payer: Self-pay | Admitting: Family Medicine

## 2018-07-23 DIAGNOSIS — G8929 Other chronic pain: Secondary | ICD-10-CM

## 2018-07-23 DIAGNOSIS — M545 Low back pain: Principal | ICD-10-CM

## 2018-08-03 ENCOUNTER — Encounter: Payer: Self-pay | Admitting: Family Medicine

## 2018-08-04 MED ORDER — NORTRIPTYLINE HCL 50 MG PO CAPS: 50.0000 mg | ORAL_CAPSULE | Freq: Every evening | ORAL | 3 refills | Status: DC | PRN

## 2018-11-28 ENCOUNTER — Other Ambulatory Visit: Payer: Self-pay | Admitting: Family Medicine

## 2018-12-01 NOTE — Telephone Encounter (Signed)
Will forward to provider for review.

## 2018-12-23 ENCOUNTER — Ambulatory Visit: Payer: Self-pay | Admitting: Family Medicine

## 2022-01-18 DIAGNOSIS — F988 Other specified behavioral and emotional disorders with onset usually occurring in childhood and adolescence: Secondary | ICD-10-CM | POA: Insufficient documentation

## 2023-01-15 DIAGNOSIS — N135 Crossing vessel and stricture of ureter without hydronephrosis: Secondary | ICD-10-CM | POA: Insufficient documentation

## 2023-04-02 DIAGNOSIS — M5412 Radiculopathy, cervical region: Secondary | ICD-10-CM | POA: Insufficient documentation

## 2023-08-05 ENCOUNTER — Telehealth: Payer: Self-pay

## 2023-08-05 NOTE — Telephone Encounter (Signed)
Patient would like to get established with Raymond [name], patients dad Raymond [name] is a patient, please advise, thanks.

## 2023-08-20 NOTE — Telephone Encounter (Signed)
 OK to schedule for Feb for new pt slot

## 2023-09-12 ENCOUNTER — Ambulatory Visit: Payer: Medicaid Other | Admitting: Family Medicine

## 2023-09-12 ENCOUNTER — Encounter: Payer: Self-pay | Admitting: Family Medicine

## 2023-09-12 DIAGNOSIS — M5412 Radiculopathy, cervical region: Secondary | ICD-10-CM | POA: Diagnosis not present

## 2023-09-12 DIAGNOSIS — F902 Attention-deficit hyperactivity disorder, combined type: Secondary | ICD-10-CM | POA: Diagnosis not present

## 2023-09-12 DIAGNOSIS — F411 Generalized anxiety disorder: Secondary | ICD-10-CM

## 2023-09-12 DIAGNOSIS — N135 Crossing vessel and stricture of ureter without hydronephrosis: Secondary | ICD-10-CM

## 2023-09-12 DIAGNOSIS — F5102 Adjustment insomnia: Secondary | ICD-10-CM

## 2023-09-12 DIAGNOSIS — F5101 Primary insomnia: Secondary | ICD-10-CM | POA: Diagnosis not present

## 2023-09-12 NOTE — Progress Notes (Signed)
 Established Patient Office Visit  Subjective  Patient ID: Raymond [name], male    DOB: 12-Mar-1986  Age: 38 y.o. MRN: [id number]  Chief Complaint  Patient presents with   Establish Care    HPI  Raymond [name] is here to establish care the last time I saw him was probably about 6 or 7 years ago.  I am also the PCP for his parents.  He has had a lot of changes since I last saw him he is really struggled with cervical pain and follows with orthopedists as well as pain management with [facility] and is on a pain contract with them.  He tries daily to manage his pain with stretches and exercises etc.  They are considering cervical spine surgery.  He also has a lot of issues with his lumbar spine.  He is currently on hydrocodone  7.5 mg 120/month, as well as metaxalone  and nortriptyline  and pregabalin  200 mg #30.  Since I last saw him he did get evaluated for adult ADHD and says that he tested positive and has been on Adderall  for some time.  He feels like its actually been very helpful.  Takes 30 mg extended release Adderall  in the morning and 10 mg short acting in the afternoon.     ROS    Objective:     There were no vitals taken for this visit.   Physical Exam Vitals and nursing note reviewed.  Constitutional:      Appearance: Normal appearance.  HENT:     Head: Normocephalic and atraumatic.  Eyes:     Conjunctiva/sclera: Conjunctivae normal.  Cardiovascular:     Rate and Rhythm: Normal rate and regular rhythm.  Pulmonary:     Effort: Pulmonary effort is normal.     Breath sounds: Normal breath sounds.  Skin:    General: Skin is warm and dry.  Neurological:     Mental Status: He is alert.  Psychiatric:        Mood and Affect: Mood normal.      No results found for any visits on 09/12/23.    The ASCVD Risk score (Arnett DK, et al., 2019) failed to calculate for the following reasons:   The 2019 ASCVD risk score is only valid for ages 59 to 37    Assessment & Plan:    Problem List Items Addressed This Visit       Nervous and Auditory   Cervical radiculitis   Currently on Lyrica  for pain control as well as nortriptyline .  Uses metaxalone  as needed for muscle relaxer and on hydrocodone  for chronic pain.      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      Insomnia   On Ambien        GAD (generalized anxiety disorder)   Currently on buspirone  to help with anxiety      Attention deficit disorder   He has a diagnosis of ADD that was made since I last saw him.  Will need to get copies of records.  As his PCP was prescribing his Adderall  he currently takes 30 mg extended release in the morning and 10 mg short acting in the afternoon.  Return if symptoms worsen or fail to improve.    Dorothyann Byars, MD

## 2023-09-12 NOTE — Assessment & Plan Note (Signed)
On Ambien 

## 2023-09-18 NOTE — Assessment & Plan Note (Signed)
Currently on Ambien 10 mg as needed.

## 2023-09-18 NOTE — Assessment & Plan Note (Signed)
He has a diagnosis of ADD that was made since I last saw him.  Will need to get copies of records.  As his PCP was prescribing his Adderall he currently takes 30 mg extended release in the morning and 10 mg short acting in the afternoon.

## 2023-09-18 NOTE — Assessment & Plan Note (Signed)
Currently on Lyrica for pain control as well as nortriptyline.  Uses metaxalone as needed for muscle relaxer and on hydrocodone for chronic pain.

## 2023-09-18 NOTE — Assessment & Plan Note (Signed)
Currently on buspirone to help with anxiety

## 2023-10-20 ENCOUNTER — Encounter: Payer: Self-pay | Admitting: Family Medicine

## 2023-10-20 DIAGNOSIS — M79671 Pain in right foot: Secondary | ICD-10-CM

## 2023-10-21 NOTE — Telephone Encounter (Signed)
 Order pended for provider review.

## 2023-10-22 ENCOUNTER — Ambulatory Visit: Admitting: Family Medicine

## 2023-10-23 ENCOUNTER — Ambulatory Visit: Admitting: Physician Assistant

## 2023-10-24 ENCOUNTER — Encounter: Payer: Self-pay | Admitting: Family Medicine

## 2023-10-24 ENCOUNTER — Ambulatory Visit: Admitting: Family Medicine

## 2023-10-24 VITALS — BP 124/84 | HR 74 | Ht 67.5 in | Wt 146.5 lb

## 2023-10-24 DIAGNOSIS — R0602 Shortness of breath: Secondary | ICD-10-CM | POA: Diagnosis not present

## 2023-10-24 DIAGNOSIS — R0782 Intercostal pain: Secondary | ICD-10-CM

## 2023-10-24 DIAGNOSIS — R1314 Dysphagia, pharyngoesophageal phase: Secondary | ICD-10-CM

## 2023-10-24 DIAGNOSIS — M25512 Pain in left shoulder: Secondary | ICD-10-CM | POA: Diagnosis not present

## 2023-10-24 NOTE — Progress Notes (Signed)
 Acute Office Visit  Subjective:     Patient ID: Raymond [name], male    DOB: June 01, 1986, 38 y.o.   MRN: [id number]  Chief Complaint  Patient presents with   Shoulder Pain    L and R x3-6 months left more pronounced mor of his shoulder blade     HPI Patient is in today for discomfort over his left collarbone medially.  He says for at least 3 months maybe even a little bit longer that he has noticed that it is tender it is larger on the left side compared to the right.  He thinks it may be related to a lot of the issues that he is having with his back and shoulders.  He sees the orthopedist for cervical spine issues and in fact just had a epidural at C6-C7 but that did not provide any relief in fact he says his neck has actually been bothering him more since then.  He is actively going to physical therapy and they had him do some resistance with bands and that actually inflamed the left collarbone area.  He says at times he feels like he is also having swallowing difficulty.  Like he has to move the position of his neck to swallow correctly.  He is choking frequently.  Is also been doing some dry needling with the trap muscle with the physical therapist.  No prior injury to the clavicle such as fracture etc.  He describes the pain as sharp at times.  ROS      Objective:    BP 124/84 (BP Location: Left Arm, Patient Position: Sitting, Cuff Size: Normal)   Pulse 74   Ht 5' 7.5" (1.715 m)   Wt 146 lb 8 oz (66.5 kg)   SpO2 98%   BMI 22.61 kg/m    Physical Exam Vitals reviewed.  Constitutional:      Appearance: Normal appearance.  HENT:     Head: Normocephalic.  Pulmonary:     Effort: Pulmonary effort is normal.  Musculoskeletal:     Comments: Left sternoclavicular Giller joint is definitely larger compared to the right is also very tender to palpation and around the joint.  He also feels like there is a little bit more of a divot below the clavicle which could be a little atrophy of  the muscles underneath.  Neurological:     Mental Status: He is alert and oriented to person, place, and time.  Psychiatric:        Mood and Affect: Mood normal.        Behavior: Behavior normal.     No results found for any visits on 10/24/23.      Assessment & Plan:   Problem List Items Addressed This Visit   None Visit Diagnoses       Intercostal pain    -  Primary   Relevant Orders   CT Chest Wo Contrast     Pain of left sternoclavicular joint       Relevant Orders   CT Chest Wo Contrast     Pharyngoesophageal dysphagia       Relevant Orders   CT Chest Wo Contrast     SOB (shortness of breath)       Relevant Orders   CT Chest Wo Contrast       We discussed further workup-I do think it is probably some arthritis in that joint and probably some acute inflammation.  But we will start with a CT scan  to take a look at the bony structure as well as check that long on that side he has had pneumothoraces on that side twice.  He is also reporting some problems swallowing so that may need some additional workup as well.  He may benefit from a swallow study or ENT referral.  But again we will start with chest CT and go from there.  Can you physical therapy but I did encourage him to speak with his therapist about avoiding some of the resistance bands training that he was doing that was actually aggravating the joint pain.  No orders of the defined types were placed in this encounter.   No follow-ups on file.  Nani Gasser, MD

## 2023-10-31 ENCOUNTER — Ambulatory Visit (INDEPENDENT_AMBULATORY_CARE_PROVIDER_SITE_OTHER)

## 2023-10-31 ENCOUNTER — Encounter: Payer: Self-pay | Admitting: Podiatry

## 2023-10-31 ENCOUNTER — Ambulatory Visit

## 2023-10-31 ENCOUNTER — Ambulatory Visit (INDEPENDENT_AMBULATORY_CARE_PROVIDER_SITE_OTHER): Admitting: Podiatry

## 2023-10-31 ENCOUNTER — Other Ambulatory Visit: Payer: Self-pay

## 2023-10-31 DIAGNOSIS — L03116 Cellulitis of left lower limb: Secondary | ICD-10-CM

## 2023-10-31 DIAGNOSIS — L03115 Cellulitis of right lower limb: Secondary | ICD-10-CM

## 2023-10-31 DIAGNOSIS — M778 Other enthesopathies, not elsewhere classified: Secondary | ICD-10-CM

## 2023-10-31 DIAGNOSIS — M79671 Pain in right foot: Secondary | ICD-10-CM

## 2023-10-31 NOTE — Progress Notes (Signed)
  Subjective:  Patient ID: Raymond [name], male    DOB: [birth year],   MRN: [id number]  No chief complaint on file.   38 y.o. male presents for concern of right ankle and foot pain that has been ongoing for over two years. He has seen two other podiatrists in the past for his foot pain. Has been treated for plantar fasciits and had a shot and has tried stretching and support and anit-inflammatories. Relates he is pain management and taking narcotics. 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 He relates burning sensations and numbness and weakness in the foot. Has a history of chronic lower and upper back pain he is currently being treated for.  He relates a tearing sensation recently in the second toe area that then spreads and gets pain to his whole foot when walking. Patient in distress about this pain and a bit sporadic as far as history goes. Relates he been having a lot of problems and would like to just be better. . Denies any other pedal complaints. Denies n/v/f/c.   Past Medical History:  Diagnosis Date   Spontaneous pneumothorax    Two in the past 2011   WEIGHT LOSS, ABNORMAL 09/30/2007   Qualifier: Diagnosis of  By: Thomos Lemons      Objective:  Physical Exam: Vascular: DP/PT pulses 2/4 bilateral. CFT <3 seconds. Normal hair growth on digits. No edema.  Skin. No lacerations or abrasions bilateral feet.  Musculoskeletal: MMT 5/5 bilateral lower extremities in DF, PF, Inversion and Eversion. Deceased ROM in DF of ankle joint. Some tenderness over peroneal tendon and medial ankle. Some sensitivity noted about the entire foot. More acute pain noted in the second plantar metatarsophalangeal joint of not and some pain with DF of the second metatarsophalangeal joint.  Neurological: Sensation intact to light touch.   Unable to view NCV and previous MRI of ankle on right but NCV is read as benign and MRI with some deltoid damage and  tear of the peroneal with not much correlation to pain currently.   Assessment:   1. Capsulitis of right foot   2. Capsulitis of left foot      Plan:  Patient was evaluated and treated and all questions answered. X-rays reviewed and discussed with patient. No acute fractures or dislocations noted. Mild spurring noted to distal phalanx of hallux and additional sesamoids noted sub second and fifth metatarsal heads.  Discussed capsulitis and inflammation of joint as well as possible neuritis and radiculpathy and treatment options with patient.  Discussed stiff sole shoes and recommend use of metatarsal pad.  Currently taking pain medications and in pain management. Following with spine specialist as well.  MRI ordered as patient has tried many conservative treatments and has had testing of his nerves and MRI of ankle but not of foot. Would like to rule out plantar plate tear vs capsulitis vs neuroma Bilateral.  Discussed if pain does not improve may consider PT and/or potential surgery.  Patient to return after MRI    Louann Sjogren, DPM

## 2023-11-04 ENCOUNTER — Other Ambulatory Visit (HOSPITAL_COMMUNITY): Payer: Self-pay

## 2023-11-04 ENCOUNTER — Encounter: Payer: Self-pay | Admitting: Family Medicine

## 2023-11-04 DIAGNOSIS — M5412 Radiculopathy, cervical region: Secondary | ICD-10-CM

## 2023-11-04 DIAGNOSIS — R0782 Intercostal pain: Secondary | ICD-10-CM

## 2023-11-04 DIAGNOSIS — M546 Pain in thoracic spine: Secondary | ICD-10-CM

## 2023-11-04 DIAGNOSIS — M25512 Pain in left shoulder: Secondary | ICD-10-CM

## 2023-11-04 DIAGNOSIS — M545 Low back pain, unspecified: Secondary | ICD-10-CM

## 2023-11-04 DIAGNOSIS — G8929 Other chronic pain: Secondary | ICD-10-CM

## 2023-11-05 ENCOUNTER — Telehealth: Payer: Self-pay

## 2023-11-05 NOTE — Telephone Encounter (Signed)
 PA request for R/L MRIs requires P2P review. Patient is scheduled for 11/10/23. Please contact [facility] at [phone] and reference # [id number].

## 2023-11-10 ENCOUNTER — Other Ambulatory Visit

## 2023-11-11 NOTE — Telephone Encounter (Signed)
 Fyi - please note the preferred location for the patient regarding physical therapy.   [facility] in [place].

## 2023-11-13 ENCOUNTER — Ambulatory Visit

## 2023-11-13 DIAGNOSIS — R0782 Intercostal pain: Secondary | ICD-10-CM

## 2023-11-13 DIAGNOSIS — M25512 Pain in left shoulder: Secondary | ICD-10-CM

## 2023-11-13 DIAGNOSIS — R0602 Shortness of breath: Secondary | ICD-10-CM

## 2023-11-13 DIAGNOSIS — R0789 Other chest pain: Secondary | ICD-10-CM

## 2023-11-13 DIAGNOSIS — R1314 Dysphagia, pharyngoesophageal phase: Secondary | ICD-10-CM

## 2023-11-14 ENCOUNTER — Ambulatory Visit (INDEPENDENT_AMBULATORY_CARE_PROVIDER_SITE_OTHER): Admitting: Podiatry

## 2023-11-14 DIAGNOSIS — Z91199 Patient's noncompliance with other medical treatment and regimen due to unspecified reason: Secondary | ICD-10-CM

## 2023-11-14 NOTE — Progress Notes (Signed)
 No show

## 2023-11-27 ENCOUNTER — Encounter: Payer: Self-pay | Admitting: Family Medicine

## 2023-11-27 DIAGNOSIS — R0981 Nasal congestion: Secondary | ICD-10-CM

## 2023-11-27 NOTE — Progress Notes (Signed)
 Hi Raymond [name], we got the CT back on your chest.  The good news is there is no significant changes in regards to your lungs or heart.  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  He said they had noted an MRI that you had back in December but it actually looks like it is worse.  We need to get you in with a urologist for this and figure out what is causing the blockage.  This could  be contributing to some of your pain.  It is definitely not a sole source you have a lot going on but I do think it could contribute either way I do not want to see any damage to your kidney and since it does look worse compared to December I think we need to get you back in with urologist.  Do you have a follow-up appointment already scheduled with them?

## 2023-12-02 ENCOUNTER — Other Ambulatory Visit: Payer: Self-pay | Admitting: Neurosurgery

## 2023-12-02 NOTE — Telephone Encounter (Signed)
Orders Placed This Encounter  Procedures   Ambulatory referral to ENT    Referral Priority:   Routine    Referral Type:   Consultation    Referral Reason:   Specialty Services Required    Requested Specialty:   Otolaryngology    Number of Visits Requested:   1    

## 2023-12-03 ENCOUNTER — Encounter (HOSPITAL_COMMUNITY): Payer: Self-pay

## 2023-12-03 ENCOUNTER — Emergency Department (HOSPITAL_COMMUNITY)
Admission: EM | Admit: 2023-12-03 | Discharge: 2023-12-03 | Disposition: A | Discharge status: Home / Self Care | Attending: Emergency Medicine | Admitting: Emergency Medicine

## 2023-12-03 ENCOUNTER — Emergency Department (HOSPITAL_COMMUNITY)

## 2023-12-03 DIAGNOSIS — R32 Unspecified urinary incontinence: Secondary | ICD-10-CM | POA: Insufficient documentation

## 2023-12-03 DIAGNOSIS — M5416 Radiculopathy, lumbar region: Secondary | ICD-10-CM | POA: Diagnosis not present

## 2023-12-03 DIAGNOSIS — M5127 Other intervertebral disc displacement, lumbosacral region: Secondary | ICD-10-CM | POA: Insufficient documentation

## 2023-12-03 DIAGNOSIS — M545 Low back pain, unspecified: Secondary | ICD-10-CM | POA: Diagnosis present

## 2023-12-03 LAB — CBC WITH DIFFERENTIAL/PLATELET
Abs Immature Granulocytes: 0.05 10*3/uL (ref 0.00–0.07)
Basophils Absolute: 0 10*3/uL (ref 0.0–0.1)
Basophils Relative: 0 %
Eosinophils Absolute: 0 10*3/uL (ref 0.0–0.5)
Eosinophils Relative: 0 %
HCT: 30.1 % — ABNORMAL LOW (ref 39.0–52.0)
Hemoglobin: 9.2 g/dL — ABNORMAL LOW (ref 13.0–17.0)
Immature Granulocytes: 1 %
Lymphocytes Relative: 7 %
Lymphs Abs: 0.6 10*3/uL — ABNORMAL LOW (ref 0.7–4.0)
MCH: 19.2 pg — ABNORMAL LOW (ref 26.0–34.0)
MCHC: 30.6 g/dL (ref 30.0–36.0)
MCV: 63 fL — ABNORMAL LOW (ref 80.0–100.0)
Monocytes Absolute: 0.2 10*3/uL (ref 0.1–1.0)
Monocytes Relative: 3 %
Neutro Abs: 8.2 10*3/uL — ABNORMAL HIGH (ref 1.7–7.7)
Neutrophils Relative %: 89 %
Platelets: 205 10*3/uL (ref 150–400)
RBC: 4.78 MIL/uL (ref 4.22–5.81)
RDW: 14.4 % (ref 11.5–15.5)
WBC: 9.1 10*3/uL (ref 4.0–10.5)
nRBC: 0 % (ref 0.0–0.2)

## 2023-12-03 LAB — COMPREHENSIVE METABOLIC PANEL WITH GFR
ALT: 12 U/L (ref 0–44)
AST: 18 U/L (ref 15–41)
Albumin: 3.9 g/dL (ref 3.5–5.0)
Alkaline Phosphatase: 33 U/L — ABNORMAL LOW (ref 38–126)
Anion gap: 10 (ref 5–15)
BUN: 14 mg/dL (ref 6–20)
CO2: 22 mmol/L (ref 22–32)
Calcium: 8.5 mg/dL — ABNORMAL LOW (ref 8.9–10.3)
Chloride: 109 mmol/L (ref 98–111)
Creatinine, Ser: 1.08 mg/dL (ref 0.61–1.24)
GFR, Estimated: >60 mL/min (ref >60)
Glucose, Bld: 131 mg/dL — ABNORMAL HIGH (ref 70–99)
Potassium: 3.6 mmol/L (ref 3.5–5.1)
Sodium: 141 mmol/L (ref 135–145)
Total Bilirubin: 0.7 mg/dL (ref 0.0–1.2)
Total Protein: 5.9 g/dL — ABNORMAL LOW (ref 6.5–8.1)

## 2023-12-03 MED ORDER — LORAZEPAM 1 MG PO TABS
0.5000 mg | ORAL_TABLET | Freq: Once | ORAL | Status: AC
  Administered 2023-12-03: 0.5 mg via ORAL
  Filled 2023-12-03: qty 1

## 2023-12-03 MED ORDER — HYDROMORPHONE HCL 1 MG/ML IJ SOLN
1.0000 mg | Freq: Once | INTRAMUSCULAR | Status: AC
  Administered 2023-12-03: 1 mg via INTRAVENOUS
  Filled 2023-12-03: qty 1

## 2023-12-03 MED ORDER — MORPHINE SULFATE (PF) 4 MG/ML IV SOLN
4.0000 mg | Freq: Once | INTRAVENOUS | Status: AC
  Administered 2023-12-03: 4 mg via INTRAVENOUS
  Filled 2023-12-03: qty 1

## 2023-12-03 MED ORDER — PREDNISONE 10 MG PO TABS: ORAL_TABLET | ORAL | 0 refills | Status: AC

## 2023-12-03 MED ORDER — DEXAMETHASONE SODIUM PHOSPHATE 10 MG/ML IJ SOLN
10.0000 mg | Freq: Once | INTRAMUSCULAR | Status: AC
  Administered 2023-12-03: 10 mg via INTRAVENOUS
  Filled 2023-12-03: qty 1

## 2023-12-03 MED ORDER — GABAPENTIN 300 MG PO CAPS: 300.0000 mg | ORAL_CAPSULE | Freq: Three times a day (TID) | ORAL | 0 refills | Status: DC

## 2023-12-03 NOTE — ED Notes (Signed)
 Patient transported to MRI

## 2023-12-03 NOTE — ED Triage Notes (Signed)
 Pt BIB EMS from home for lower back pain shooting down to legs. Pt reports new onset BLE numbness/increased pain. Took home dose pain med without relief. Hx of neurological birth defect. EMS gave 200 mcg fentanyl with little relief.

## 2023-12-03 NOTE — ED Provider Notes (Signed)
 [place] EMERGENCY DEPARTMENT AT [facility] Provider Note   CSN: [id number] Arrival date & time: 12/03/23  1415     History Chief Complaint  Patient presents with   Back Pain    Raymond [name] is a 38 y.o. male. 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 He reports that he has had ongoing and worsening low back pain over the last few days with some reported paresthesia and weakness in bilateral lower legs. Also endorsing some bladder incontinence that has been worsening with time. Nothing making pain better. Movement worsens pain. Currently follows with [facility] for known cervical spine issues and has scheduled procedure next month.  Patient endorses starting on a course of steroids over the last few days but denies any recent fever, chills or bodyaches.  No recent IV drug use.   Back Pain      Home Medications Prior to Admission medications   Medication Sig Start Date End Date Taking? Authorizing Provider  gabapentin  (NEURONTIN ) 300 MG capsule Take 1 capsule (300 mg total) by mouth 3 (three) times daily for 14 days. 12/03/23 12/17/23 Yes Kilea Mccarey A, PA-C  predniSONE  (DELTASONE ) 10 MG tablet Take 6 tablets (60 mg total) by mouth daily for 2 days, THEN 5 tablets (50 mg total) daily for 2 days, THEN 4 tablets (40 mg total) daily for 2 days, THEN 3 tablets (30 mg total) daily for 2 days, THEN 2 tablets (20 mg total) daily for 2 days. 12/03/23 12/13/23 Yes Kinleigh Nault A, PA-C  amphetamine-dextroamphetamine (ADDERALL XR) 30 MG 24 hr capsule Take 1 capsule by mouth every morning. 09/14/22   [provider]  amphetamine-dextroamphetamine (ADDERALL) 10 MG tablet Take 1 tablet by mouth daily. 09/14/22   [provider]  busPIRone  (BUSPAR ) 10 MG tablet TAKE 1 TABLET(10 MG) BY MOUTH THREE TIMES DAILY 07/23/18   Cydney Draft, MD  naloxone  [facility]) nasal spray 4 mg/0.1 mL Place 0.4 mg into the nose once. PRN 07/03/23   [provider]  nortriptyline  (PAMELOR ) 50 MG capsule TAKE 1-2 CAPSULES BY MOUTH EVERY NIGHT AT BEDTIME AS NEEDED FOR PAIN 12/01/18   Cydney Draft, MD  pregabalin  (LYRICA ) 200 MG capsule TAKE 1 CAPSULE BY MOUTH THREE TIMES DAILY 07/23/18   Cydney Draft, MD  zolpidem  (AMBIEN ) 10 MG tablet Take 10 mg by mouth at bedtime as needed. 02/15/17   [provider]      Allergies    Baclofen  and Cyclobenzaprine     Review of Systems   Review of Systems  Musculoskeletal:  Positive for back pain.  All other systems reviewed and are negative.   Physical Exam Updated Vital Signs BP 119/72 (BP Location: Left Arm)   Pulse 74   Temp 98 F (36.7 C) (Oral)   Resp 17   Ht 5\' 7"  (1.702 m)   Wt 66.5 kg   SpO2 99%   BMI 22.96 kg/m  Physical Exam Vitals and nursing note reviewed.  Constitutional:      General: He is not in acute distress.    Appearance: He is well-developed.     Comments: Uncomfortable  HENT:     Head: Normocephalic and atraumatic.  Eyes:     Conjunctiva/sclera: Conjunctivae normal.  Cardiovascular:     Rate and Rhythm: Normal rate and regular rhythm.     Heart sounds: No murmur heard. Pulmonary:  Effort: Pulmonary effort is normal. No respiratory distress.     Breath sounds: Normal breath sounds.  Abdominal:     Palpations: Abdomen is soft.     Tenderness: There is no abdominal tenderness.  Musculoskeletal:        General: Tenderness present. No swelling, deformity or signs of injury.     Cervical back: Neck supple.     Right lower leg: No edema.     Left lower leg: No edema.     Comments: Pain to palpation at the midline lumbar spine and sacrum as well as the bilateral hips. Pain worsens with any movement or reposition of the lower extremities.  Skin:    General: Skin is warm and dry.     Capillary Refill: Capillary refill takes less than 2 seconds.   Neurological:     Mental Status: He is alert.     Sensory: Sensory deficit present.     Motor: Weakness and atrophy present.     Comments: Lower extremity strength 3/5 bilateral and appears to be diminished largely due to pain. Reports sensation present but feels numb from the low back through both legs primarily to the lateral and posterior portions.  Psychiatric:        Mood and Affect: Mood normal.     ED Results / Procedures / Treatments   Labs (all labs ordered are listed, but only abnormal results are displayed) Labs Reviewed  CBC WITH DIFFERENTIAL/PLATELET - Abnormal; Notable for the following components:      Result Value   Hemoglobin 9.2 (*)    HCT 30.1 (*)    MCV 63.0 (*)    MCH 19.2 (*)    Neutro Abs 8.2 (*)    Lymphs Abs 0.6 (*)    All other components within normal limits  COMPREHENSIVE METABOLIC PANEL WITH GFR - Abnormal; Notable for the following components:   Glucose, Bld 131 (*)    Calcium 8.5 (*)    Total Protein 5.9 (*)    Alkaline Phosphatase 33 (*)    All other components within normal limits    EKG None  Radiology MR LUMBAR SPINE WO CONTRAST Result Date: 12/03/2023 CLINICAL DATA:  Lower back pain shooting down lower extremities. New onset of numbness and increased pain in the lower extremities. EXAM: MRI LUMBAR SPINE WITHOUT CONTRAST TECHNIQUE: Multiplanar, multisequence MR imaging of the lumbar spine was performed. No intravenous contrast was administered. COMPARISON:  MRI lumbar spine 07/20/2023. FINDINGS: Segmentation:  Standard. Alignment: Lumbar lordosis is maintained. Trace retrolisthesis of L5 on S1. Vertebrae: No bone marrow edema or evidence of acute fracture. Vertebral body heights are maintained. Normal bone marrow signal intensity without suspicious osseous lesion appreciated. Conus medullaris and cauda equina: Conus extends to the L1-2 level. Conus and cauda equina appear normal. Paraspinal and other soft tissues: The visualized paraspinal soft  tissues are unremarkable. Similar appearance of left hydrocephalus. Disc levels: T12-L1: No significant disc bulge. No significant spinal canal stenosis or foraminal stenosis. L1-2: No significant disc bulge. No significant spinal canal stenosis or foraminal stenosis. L2-3: No significant disc bulge. No significant spinal canal stenosis or foraminal stenosis. L3-4: No significant disc bulge. No significant spinal canal stenosis or foraminal stenosis. L4-5: Minimal disc bulge. No significant spinal canal stenosis. Mild facet hypertrophy. No significant foraminal stenosis. L5-S1: Disc desiccation and mild disc height loss. Diffuse disc bulge eccentric to the left. Mild narrowing of the lateral recesses with possible impingement upon the traversing left S1 nerve roots. Mild facet  hypertrophy. Mild spinal canal stenosis. There is mild foraminal stenosis on the left. Extraforaminal component of disc bulge likely contacts the left L5 nerve root. IMPRESSION: Degenerative changes of the lumbar spine as above. Diffuse disc bulge at L5-S1 resulting in lateral recess narrowing with possible impingement upon the traversing left S1 nerve roots. Extraforaminal component of the disc bulge contacts the left L5 nerve root. Mild foraminal stenosis on the left at L5-S1. No high-grade foraminal stenosis appreciated. Redemonstration of left-sided hydrocephalus. Recommend correlation with renal ultrasound. Electronically Signed   By: Denny Flack M.D.   On: 12/03/2023 16:49    Procedures Procedures   Medications Ordered in ED Medications  morphine (PF) 4 MG/ML injection 4 mg (4 mg Intravenous Given 12/03/23 1541)  LORazepam (ATIVAN) tablet 0.5 mg (0.5 mg Oral Given 12/03/23 1605)  morphine (PF) 4 MG/ML injection 4 mg (4 mg Intravenous Given 12/03/23 1706)  dexamethasone (DECADRON) injection 10 mg (10 mg Intravenous Given 12/03/23 1756)  HYDROmorphone (DILAUDID) injection 1 mg (1 mg Intravenous Given 12/03/23 1808)    ED  Course/ Medical Decision Making/ A&P                                 Medical Decision Making Amount and/or Complexity of Data Reviewed Labs: ordered. Radiology: ordered.  Risk Prescription drug management.   This patient presents to the ED for concern of back pain.  Differential diagnosis includes lumbar radiculopathy, cauda equina syndrome, conus medullaris syndrome, chronic low back pain   Lab Tests:  I Ordered, and personally interpreted labs.  The pertinent results include: CBC with anemia with hemoglobin at 9.2, CMP without obvious abnormalities   Imaging Studies ordered:  I ordered imaging studies including MR lumbar spine I independently visualized and interpreted imaging which showed Degenerative changes of the lumbar spine as above. Diffuse disc bulge at L5-S1 resulting in lateral recess narrowing with possible impingement upon the traversing left S1 nerve roots. Extraforaminal component of the disc bulge contacts the left L5 nerve root. Mild foraminal stenosis on the left at L5-S1. No high-grade foraminal stenosis appreciated. Redemonstration of left-sided hydrocephalus. Recommend correlation with renal ultrasound. 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  Reports has been ongoing for the last 3 to 4 days.  Was seen at [facility] urgent care over the weekend and was prescribed prednisone  for management of symptoms. Endorsing some urinary incontinence as well as radiating symptoms to  bilateral lower extremities with some numbness and weakness in the posterior and lateral thighs.  No history of similar symptoms.  Currently follows with [facility] for cervical spine abnormalities and is pending surgery in about 1 week.  No recent fever, chills, body aches, recent IV drug use, steroids, or prior history of malignancy. Physical exam is concerning with notable weakness in lower extremities with strength 3 out of 5.  Difficult to assess reflexes due to patient positioning and difficulty with moving patient.  Tenderness to palpation in the midline lumbar spine somewhere between L4-S1.  Given history and some concern for possible neurological involvement including concerns for cauda equina syndrome, conus medullaris syndrome, will obtain MRI lumbar spine. CBC and CMP unremarkable with some notable anemia with hemoglobin of 9.2.  Difficult compare to any recent labs as last labs were over a decade ago. MRI lumbar shows a diffuse disc bulge at L5-S1 resulting in lateral recess narrowing with possible impingement upon the traversing left S1 nerve roots. No obvious findings to suggest cauda equina or conus medullaris syndrome. Consult [facility]. Spoke with Maritza Sidles, neurosurgery PA-C, who advised discharging patient home with PO steroids, gabapentin , and close outpatient follow up. No indication for admission except for pain control at this time. Informed patient of these recommendations and he is agreeable. Prednisone  burst and gabapentin  sent to pharmacy. Discussed return precautions. Otherwise stable at this time and discharged home.  Final Clinical Impression(s) / ED Diagnoses Final diagnoses:  Herniation of intervertebral disc between L5 and S1  Lumbar radiculopathy    Rx / DC Orders ED Discharge Orders          Ordered    predniSONE  (DELTASONE ) 10 MG tablet  Daily        12/03/23 1844    gabapentin  (NEURONTIN ) 300 MG capsule  3 times daily        12/03/23  1844              Aspen Deterding A, PA-C 12/04/23 0024    Lind Repine, MD 12/04/23 2042

## 2023-12-03 NOTE — Discharge Instructions (Signed)
 You were seen in the emergency department today for concerns of low back pain.  You had an MRI of your low back performed which did show some disc herniation at the L5-S1 level which is causing some nerve impingement.  This is likely the source of your current pain and symptoms.  I spoke with the neurosurgery group which is [facility] who was able to review your MRI imaging and reported reassuring findings from their interpretation.  They do want you to be seen in office for further assessment.  In the meantime, advised already on course of steroids as well as pain medication to try to stay on top of your symptoms.  Please take this as prescribed and follow-up with [facility].  For any concerns of new or worsening symptoms, return to the emergency department.

## 2023-12-05 NOTE — Progress Notes (Addendum)
 Surgical Instructions   Your procedure is scheduled on Dec 13, 2023. Report to [facility] Main Entrance "A" at 6:00 A.M., then check in with the Admitting office. Any questions or running late day of surgery: call [phone]  Questions prior to your surgery date: call [phone], Monday-Friday, 8am-4pm. If you experience any cold or flu symptoms such as cough, fever, chills, shortness of breath, etc. between now and your scheduled surgery, please notify us  at the above number.     Remember:  Do not eat or drink after midnight the night before your surgery      Take these medicines the morning of surgery with A SIP OF WATER  baclofen  (LIORESAL )  busPIRone  (BUSPAR )  predniSONE  (DELTASONE ) ? SPIRIVA  tiZANidine (ZANAFLEX)  VENTOLIN HFA  inhaler   May take these medicines IF NEEDED: naloxone (NARCAN) nasal spray  oxyCODONE -acetaminophen  (PERCOCET   One week prior to surgery, STOP taking any Aspirin (unless otherwise instructed by your surgeon) Aleve , Naproxen , Ibuprofen, Motrin, Advil, Goody's, BC's, all herbal medications, fish oil, and non-prescription vitamins.                     Do NOT Smoke (Tobacco/Vaping) for 24 hours prior to your procedure.  If you use a CPAP at night, you may bring your mask/headgear for your overnight stay.   You will be asked to remove any contacts, glasses, piercing's, hearing aid's, dentures/partials prior to surgery. Please bring cases for these items if needed.    Patients discharged the day of surgery will not be allowed to drive home, and someone needs to stay with them for 24 hours.  SURGICAL WAITING ROOM VISITATION Patients may have no more than 2 support people in the waiting area - these visitors may rotate.   Pre-op nurse will coordinate an appropriate time for 1 ADULT support person, who may not rotate, to accompany patient in pre-op.  Children under the age of 60 must have an adult with them who is not the patient and must remain in  the main waiting area with an adult.  If the patient needs to stay at the hospital during part of their recovery, the visitor guidelines for inpatient rooms apply.  Please refer to the [facility] website for the visitor guidelines for any additional information.   If you received a COVID test during your pre-op visit  it is requested that you wear a mask when out in public, stay away from anyone that may not be feeling well and notify your surgeon if you develop symptoms. If you have been in contact with anyone that has tested positive in the last 10 days please notify you surgeon.      Pre-operative 5 CHG Bathing Instructions   You can play a key role in reducing the risk of infection after surgery. Your skin needs to be as free of germs as possible. You can reduce the number of germs on your skin by washing with CHG (chlorhexidine gluconate) soap before surgery. CHG is an antiseptic soap that kills germs and continues to kill germs even after washing.   DO NOT use if you have an allergy to chlorhexidine/CHG or antibacterial soaps. If your skin becomes reddened or irritated, stop using the CHG and notify one of our RNs at [phone].   Please shower with the CHG soap starting 4 days before surgery using the following schedule:     Please keep in mind the following:  DO NOT shave, including legs and underarms,  starting the day of your first shower.   You may shave your face at any point before/day of surgery.  Place clean sheets on your bed the day you start using CHG soap. Use a clean washcloth (not used since being washed) for each shower. DO NOT sleep with pets once you start using the CHG.   CHG Shower Instructions:  Wash your face and private area with normal soap. If you choose to wash your hair, wash first with your normal shampoo.  After you use shampoo/soap, rinse your hair and body thoroughly to remove shampoo/soap residue.  Turn the water OFF and apply about 3  tablespoons (45 ml) of CHG soap to a CLEAN washcloth.  Apply CHG soap ONLY FROM YOUR NECK DOWN TO YOUR TOES (washing for 3-5 minutes)  DO NOT use CHG soap on face, private areas, open wounds, or sores.  Pay special attention to the area where your surgery is being performed.  If you are having back surgery, having someone wash your back for you may be helpful. Wait 2 minutes after CHG soap is applied, then you may rinse off the CHG soap.  Pat dry with a clean towel  Put on clean clothes/pajamas   If you choose to wear lotion, please use ONLY the CHG-compatible lotions that are listed below.  Additional instructions for the day of surgery: DO NOT APPLY any lotions, deodorants, cologne, or perfumes.   Do not bring valuables to the hospital. [facility] is not responsible for any belongings/valuables. Do not wear nail polish, gel polish, artificial nails, or any other type of covering on natural nails (fingers and toes) Do not wear jewelry or makeup Put on clean/comfortable clothes.  Please brush your teeth.  Ask your nurse before applying any prescription medications to the skin.     CHG Compatible Lotions   Aveeno Moisturizing lotion  Cetaphil Moisturizing Cream  Cetaphil Moisturizing Lotion  Clairol Herbal Essence Moisturizing Lotion, Dry Skin  Clairol Herbal Essence Moisturizing Lotion, Extra Dry Skin  Clairol Herbal Essence Moisturizing Lotion, Normal Skin  Curel Age Defying Therapeutic Moisturizing Lotion with Alpha Hydroxy  Curel Extreme Care Body Lotion  Curel Soothing Hands Moisturizing Hand Lotion  Curel Therapeutic Moisturizing Cream, Fragrance-Free  Curel Therapeutic Moisturizing Lotion, Fragrance-Free  Curel Therapeutic Moisturizing Lotion, Original Formula  Eucerin Daily Replenishing Lotion  Eucerin Dry Skin Therapy Plus Alpha Hydroxy Crme  Eucerin Dry Skin Therapy Plus Alpha Hydroxy Lotion  Eucerin Original Crme  Eucerin Original Lotion  Eucerin Plus Crme  Eucerin Plus Lotion  Eucerin TriLipid Replenishing Lotion  Keri Anti-Bacterial Hand Lotion  Keri Deep Conditioning Original Lotion Dry Skin Formula Softly Scented  Keri Deep Conditioning Original Lotion, Fragrance Free Sensitive Skin Formula  Keri Lotion Fast Absorbing Fragrance Free Sensitive Skin Formula  Keri Lotion Fast Absorbing Softly Scented Dry Skin Formula  Keri Original Lotion  Keri Skin Renewal Lotion Keri Silky Smooth Lotion  Keri Silky Smooth Sensitive Skin Lotion  Nivea Body Creamy Conditioning Oil  Nivea Body Extra Enriched Lotion  Nivea Body Original Lotion  Nivea Body Sheer Moisturizing Lotion Nivea Crme  Nivea Skin Firming Lotion  NutraDerm 30 Skin Lotion  NutraDerm Skin Lotion  NutraDerm Therapeutic Skin Cream  NutraDerm Therapeutic Skin Lotion  ProShield Protective Hand Cream  Provon moisturizing lotion  Please read over the following fact sheets that you were given.

## 2023-12-06 ENCOUNTER — Other Ambulatory Visit: Payer: Self-pay

## 2023-12-06 ENCOUNTER — Encounter (HOSPITAL_COMMUNITY): Payer: Self-pay

## 2023-12-06 ENCOUNTER — Encounter (HOSPITAL_COMMUNITY)
Admission: RE | Admit: 2023-12-06 | Discharge: 2023-12-06 | Disposition: A | Discharge status: Home / Self Care | Source: Ambulatory Visit | Attending: Neurosurgery | Admitting: Neurosurgery

## 2023-12-06 VITALS — BP 124/83 | HR 115 | Temp 98.3°F | Resp 17 | Ht 68.0 in | Wt 142.2 lb

## 2023-12-06 DIAGNOSIS — Z01812 Encounter for preprocedural laboratory examination: Secondary | ICD-10-CM | POA: Diagnosis present

## 2023-12-06 DIAGNOSIS — Z01818 Encounter for other preprocedural examination: Secondary | ICD-10-CM

## 2023-12-06 HISTORY — DX: Essential (primary) hypertension: I10

## 2023-12-06 HISTORY — DX: Attention-deficit hyperactivity disorder, unspecified type: F90.9

## 2023-12-06 LAB — CBC
HCT: 37.8 % — ABNORMAL LOW (ref 39.0–52.0)
Hemoglobin: 11.6 g/dL — ABNORMAL LOW (ref 13.0–17.0)
MCH: 19.2 pg — ABNORMAL LOW (ref 26.0–34.0)
MCHC: 30.7 g/dL (ref 30.0–36.0)
MCV: 62.7 fL — ABNORMAL LOW (ref 80.0–100.0)
Platelets: 274 10*3/uL (ref 150–400)
RBC: 6.03 MIL/uL — ABNORMAL HIGH (ref 4.22–5.81)
RDW: 14.7 % (ref 11.5–15.5)
WBC: 10 10*3/uL (ref 4.0–10.5)
nRBC: 0 % (ref 0.0–0.2)

## 2023-12-06 LAB — TYPE AND SCREEN
ABO/RH(D): A POS
Antibody Screen: NEGATIVE

## 2023-12-06 LAB — SURGICAL PCR SCREEN
MRSA, PCR: NEGATIVE
Staphylococcus aureus: POSITIVE — AB

## 2023-12-06 NOTE — Progress Notes (Signed)
 PCP - Cydney Draft, MD   Cardiologist - denies  PPM/ICD - denies Device Orders - n/a Rep Notified - n/a  Chest x-ray - denies EKG - denies Stress Test - denies ECHO - denies Cardiac Cath - denies  Sleep Study - denies CPAP - n/a  DM -denies  Blood Thinner Instructions:denies Aspirin Instructions:denies  ERAS Protcol -NPO   COVID TEST- n/a   Anesthesia review: yes, hx thalassemia minor, ADD,   Patient denies shortness of breath, fever, cough and chest pain at PAT appointment   All instructions explained to the patient, with a verbal understanding of the material. Patient agrees to go over the instructions while at home for a better understanding. Patient also instructed to self quarantine after being tested for COVID-19. The opportunity to ask questions was provided.

## 2023-12-09 ENCOUNTER — Encounter (HOSPITAL_COMMUNITY): Payer: Self-pay | Admitting: Physician Assistant

## 2023-12-10 ENCOUNTER — Emergency Department (HOSPITAL_COMMUNITY)

## 2023-12-10 ENCOUNTER — Emergency Department (HOSPITAL_COMMUNITY)
Admission: EM | Admit: 2023-12-10 | Discharge: 2023-12-10 | Disposition: A | Discharge status: Home / Self Care | Attending: Emergency Medicine | Admitting: Emergency Medicine

## 2023-12-10 ENCOUNTER — Other Ambulatory Visit: Payer: Self-pay

## 2023-12-10 ENCOUNTER — Encounter: Payer: Self-pay | Admitting: Family Medicine

## 2023-12-10 DIAGNOSIS — M542 Cervicalgia: Secondary | ICD-10-CM | POA: Diagnosis present

## 2023-12-10 DIAGNOSIS — R109 Unspecified abdominal pain: Secondary | ICD-10-CM | POA: Insufficient documentation

## 2023-12-10 DIAGNOSIS — M5090 Cervical disc disorder, unspecified, unspecified cervical region: Secondary | ICD-10-CM | POA: Insufficient documentation

## 2023-12-10 DIAGNOSIS — M5412 Radiculopathy, cervical region: Secondary | ICD-10-CM

## 2023-12-10 LAB — CBC WITH DIFFERENTIAL/PLATELET
Abs Immature Granulocytes: 0.08 10*3/uL — ABNORMAL HIGH (ref 0.00–0.07)
Basophils Absolute: 0 10*3/uL (ref 0.0–0.1)
Basophils Relative: 0 %
Eosinophils Absolute: 0 10*3/uL (ref 0.0–0.5)
Eosinophils Relative: 0 %
HCT: 36.1 % — ABNORMAL LOW (ref 39.0–52.0)
Hemoglobin: 11.2 g/dL — ABNORMAL LOW (ref 13.0–17.0)
Immature Granulocytes: 1 %
Lymphocytes Relative: 16 %
Lymphs Abs: 1.3 10*3/uL (ref 0.7–4.0)
MCH: 19.3 pg — ABNORMAL LOW (ref 26.0–34.0)
MCHC: 31 g/dL (ref 30.0–36.0)
MCV: 62.1 fL — ABNORMAL LOW (ref 80.0–100.0)
Monocytes Absolute: 0.8 10*3/uL (ref 0.1–1.0)
Monocytes Relative: 10 %
Neutro Abs: 6.3 10*3/uL (ref 1.7–7.7)
Neutrophils Relative %: 73 %
Platelets: 231 10*3/uL (ref 150–400)
RBC: 5.81 MIL/uL (ref 4.22–5.81)
RDW: 14.8 % (ref 11.5–15.5)
Smear Review: NORMAL
WBC: 8.5 10*3/uL (ref 4.0–10.5)
nRBC: 0 % (ref 0.0–0.2)

## 2023-12-10 LAB — COMPREHENSIVE METABOLIC PANEL WITH GFR
ALT: 17 U/L (ref 0–44)
AST: 16 U/L (ref 15–41)
Albumin: 4.2 g/dL (ref 3.5–5.0)
Alkaline Phosphatase: 38 U/L (ref 38–126)
Anion gap: 11 (ref 5–15)
BUN: 20 mg/dL (ref 6–20)
CO2: 25 mmol/L (ref 22–32)
Calcium: 8.7 mg/dL — ABNORMAL LOW (ref 8.9–10.3)
Chloride: 101 mmol/L (ref 98–111)
Creatinine, Ser: 1 mg/dL (ref 0.61–1.24)
GFR, Estimated: >60 mL/min (ref >60)
Glucose, Bld: 97 mg/dL (ref 70–99)
Potassium: 3.6 mmol/L (ref 3.5–5.1)
Sodium: 137 mmol/L (ref 135–145)
Total Bilirubin: 1.1 mg/dL (ref 0.0–1.2)
Total Protein: 6.4 g/dL — ABNORMAL LOW (ref 6.5–8.1)

## 2023-12-10 LAB — LIPASE, BLOOD: Lipase: 28 U/L (ref 11–51)

## 2023-12-10 MED ORDER — ONDANSETRON HCL 4 MG/2ML IJ SOLN
4.0000 mg | Freq: Once | INTRAMUSCULAR | Status: AC
  Administered 2023-12-10: 4 mg via INTRAVENOUS
  Filled 2023-12-10: qty 2

## 2023-12-10 MED ORDER — HYDROMORPHONE HCL 1 MG/ML IJ SOLN
1.0000 mg | Freq: Once | INTRAMUSCULAR | Status: AC
  Administered 2023-12-10: 1 mg via INTRAVENOUS
  Filled 2023-12-10: qty 1

## 2023-12-10 MED ORDER — LORAZEPAM 2 MG/ML IJ SOLN
1.0000 mg | Freq: Once | INTRAMUSCULAR | Status: AC | PRN
  Administered 2023-12-10: 1 mg via INTRAVENOUS
  Filled 2023-12-10: qty 1

## 2023-12-10 NOTE — ED Notes (Addendum)
 Pt taken to MRI

## 2023-12-10 NOTE — Discharge Instructions (Signed)
 The MRI today showed some improvement of the C6-C7 disc bulge as well as the C5-C6 disc bulge.  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  I spoke with the neurosurgery office today.  Follow up with Dr Adonis Alamin as planned.  They recommended contacting the office sooner if you have any questions.

## 2023-12-10 NOTE — ED Triage Notes (Signed)
 Pt BIB EMS ffrom home. C/o back pain he rates 10/10. Pt take Oxt 4x daily with no relief. EMS gave 100mcg Fentanyl with no relief and 13mg s Ketamine Drip with relief.

## 2023-12-10 NOTE — ED Provider Notes (Signed)
 [place] EMERGENCY DEPARTMENT AT [facility] Provider Note   CSN: [id number] Arrival date & time: 12/10/23  8295     History  Chief Complaint  Patient presents with   Back Pain    Pt BIB EMS ffrom home. C/o back pain he rates 10/10. Pt take Oxt 4x daily with no relief. EMS gave 100mcg Fentanyl with no relief and 13mg s Ketamine Drip with relief.    Raymond [name] is a 38 y.o. male.   Back Pain    Patient has a history of ADHD who presents ED with complaints of severe neck pain.  Patient does have a history of herniated disc in his neck.  He is scheduled to have cervical spine surgery in 3 days.  Patient also recently had pain in his lower back.  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  Patient states last night however he started having creasing pain in his neck.  He also felt that he was having trouble breathing.  He felt like his diaphragm was not moving properly and that he was paralyzed.  Patient called EMS this morning.  He was given fentanyl as well as ketamine.  Patient is concerned that there is a lesion higher in his cervical spine that they are not addressing.  He denies any recent fevers or chills.  He has been having some abdominal pain as well that started last evening.  He has not had any vomiting or diarrhea.  Home Medications Prior to Admission medications   Medication Sig Start Date End Date Taking? Authorizing Provider  amphetamine-dextroamphetamine (ADDERALL XR) 30 MG 24 hr capsule Take 30 mg by mouth every morning. 09/14/22  Yes [provider]  amphetamine-dextroamphetamine (ADDERALL) 10 MG tablet Take 10 mg by mouth daily in the afternoon. 09/14/22  Yes [provider]  baclofen  (LIORESAL ) 10 MG tablet Take 10 mg by mouth daily.   Yes [provider]  busPIRone  (BUSPAR ) 10 MG tablet TAKE 1 TABLET(10 MG) BY MOUTH THREE TIMES DAILY 07/23/18  Yes Cydney Draft, MD   olmesartan (BENICAR) 5 MG tablet Take 5 mg by mouth at bedtime.   Yes [provider]  oxyCODONE -acetaminophen  (PERCOCET) 7.5-325 MG tablet Take 1 tablet by mouth in the morning, at noon, in the evening, and at bedtime.   Yes [provider]  predniSONE  (DELTASONE ) 10 MG tablet Take 10-60 mg by mouth See admin instructions. Day 1 take 6 tablets Day 2 take 6 tablets Day 3 take 5 tablets Day 4 take 5 tablets Day 5 take 4 tablets Day 6 take 4 tablets Day 7 take 3 tablets Day 8 take 3 tablets Day 9 take 2 tablets Day 10 take 2 tablets Day 11 take 1 tablet Day 12 take 1 tablet   Yes [provider]  pregabalin  (LYRICA ) 200 MG capsule Take 200 mg by mouth at bedtime.   Yes [provider]  SPIRIVA RESPIMAT 1.25 MCG/ACT AERS Inhale 2 puffs into the lungs daily.   Yes [provider]  tiZANidine (ZANAFLEX) 4 MG tablet Take 4 mg by mouth 3 (three) times daily.   Yes [provider]  VENTOLIN HFA 108 (90 Base) MCG/ACT inhaler Inhale 1-2 puffs into the lungs every 6 (six) hours as needed for shortness of breath or wheezing.   Yes [provider]  zolpidem  (AMBIEN ) 10 MG tablet Take 10 mg by mouth at bedtime. 02/15/17  Yes [provider]  zonisamide (ZONEGRAN) 50 MG capsule Take 100 mg by mouth at bedtime.   Yes [provider]  gabapentin  (NEURONTIN ) 300 MG capsule Take 1 capsule (300 mg total) by mouth 3 (three) times daily for 14 days. Patient not taking: Reported on 12/04/2023 12/03/23 12/17/23  Zelaya, Oscar A, PA-C  naloxone [facility]) nasal spray 4 mg/0.1 mL Place 0.4 mg into the nose once. PRN 07/03/23   [provider]  predniSONE  (DELTASONE ) 10 MG tablet Take 6 tablets (60 mg total) by mouth daily for 2 days, THEN 5 tablets (50 mg total) daily for 2 days, THEN 4 tablets (40 mg total) daily for 2 days, THEN 3 tablets (30 mg total) daily for 2 days, THEN 2 tablets (20 mg total) daily for 2 days. Patient not  taking: Reported on 12/04/2023 12/03/23 12/13/23  Zelaya, Oscar A, PA-C      Allergies    Cyclobenzaprine     Review of Systems   Review of Systems  Musculoskeletal:  Positive for back pain.    Physical Exam Updated Vital Signs BP (!) 130/93   Pulse 83   Temp 98 F (36.7 C) (Oral)   Resp 10   SpO2 99%  Physical Exam Vitals and nursing note reviewed.  Constitutional:      Appearance: He is well-developed. He is not diaphoretic.  HENT:     Head: Normocephalic and atraumatic.     Right Ear: External ear normal.     Left Ear: External ear normal.  Eyes:     General: No scleral icterus.       Right eye: No discharge.        Left eye: No discharge.     Conjunctiva/sclera: Conjunctivae normal.  Neck:     Trachea: No tracheal deviation.  Cardiovascular:     Rate and Rhythm: Normal rate and regular rhythm.  Pulmonary:     Effort: Pulmonary effort is normal. No respiratory distress.     Breath sounds: Normal breath sounds. No stridor. No wheezing or rales.  Abdominal:     General: Bowel sounds are normal. There is no distension.     Palpations: Abdomen is soft.     Tenderness: There is abdominal tenderness. There is no guarding or rebound.  Musculoskeletal:        General: No tenderness or deformity.     Cervical back: Neck supple.  Skin:    General: Skin is warm and dry.     Findings: No rash.  Neurological:     General: No focal deficit present.     Mental Status: He is alert.     Cranial Nerves: No cranial nerve deficit, dysarthria or facial asymmetry.     Motor: Weakness present. No abnormal muscle tone or seizure activity.     Coordination: Coordination normal.     Comments: Weakness bilateral grip strength plantarflexion strength bilaterally.  Psychiatric:        Mood and Affect: Mood normal.     ED Results / Procedures / Treatments   Labs (all labs ordered are listed, but only abnormal results are displayed) Labs Reviewed  COMPREHENSIVE METABOLIC PANEL WITH  GFR - Abnormal; Notable for the following components:      Result Value   Calcium 8.7 (*)    Total Protein 6.4 (*)    All other components within normal limits  CBC WITH DIFFERENTIAL/PLATELET - Abnormal; Notable for the following components:   Hemoglobin 11.2 (*)    HCT 36.1 (*)  MCV 62.1 (*)    MCH 19.3 (*)    Abs Immature Granulocytes 0.08 (*)    All other components within normal limits  LIPASE, BLOOD  URINALYSIS, ROUTINE W REFLEX MICROSCOPIC    EKG Normal sinus rhythm rate 88  Radiology MR Cervical Spine Wo Contrast Result Date: 12/10/2023 CLINICAL DATA:  Myelopathy, acute. EXAM: MRI CERVICAL SPINE WITHOUT CONTRAST TECHNIQUE: Multiplanar, multisequence MR imaging of the cervical spine was performed. No intravenous contrast was administered. COMPARISON:  09/06/2023 FINDINGS: Alignment: No malalignment. Vertebrae: No fracture or focal bone lesion. Cord: No cord compression or focal cord lesion. Posterior Fossa, vertebral arteries, paraspinal tissues: Negative Disc levels: No abnormality from the foramen magnum through C2-3. 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 No central canal stenosis. Bony foraminal narrowing on the right that could possibly affect the right C4 nerve. C4-5: Normal interspace. C5-6: Normal interspace.  Disc bulge seen previously has involuted. C6-7: Bulging of the disc. Narrowing of the ventral subarachnoid space but no compressive effect upon the cord. Mild bilateral bony foraminal narrowing. Findings at this level have improved since the study of January. C7-T1: Normal interspace. IMPRESSION: 1. 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 Bony foraminal narrowing on the right that could possibly affect the right C4 nerve. 2. C6-7: Disc bulge. Narrowing of the ventral subarachnoid space but no compressive effect upon the cord. Mild bilateral bony foraminal narrowing. Findings at this level have improved since the study of January. 3. C5-6: Normal appearance today.  Involution of a previously seen disc bulge. Electronically Signed   By: Bettylou Brunner M.D.   On: 12/10/2023 13:00    Procedures Procedures    Medications Ordered in ED Medications  HYDROmorphone  (DILAUDID ) injection 1 mg (1 mg Intravenous Given 12/10/23 1055)  LORazepam  (ATIVAN ) injection 1 mg (1 mg Intravenous Given 12/10/23 1119)  ondansetron (ZOFRAN) injection 4 mg (4 mg Intravenous Given 12/10/23 1119)  HYDROmorphone  (DILAUDID ) injection 1 mg (1 mg Intravenous Given 12/10/23 1347)    ED Course/ Medical Decision Making/ A&P Clinical Course as of 12/10/23 1418  Tue Dec 10, 2023  0910 30 days oxycodone  on 4/4 [JK]  1049 Waiting to hear back from neurosurgery.  I contacted the office previously.  With the patient's increasing pain in his neck and his concerns about the spinal cord being affecting his breathing we will proceed with MRI imaging [JK]  1244 Reviewed case with [facility] [phone].  I will call back once mri is done to review results [JK]  1337 MRI showed facet osteoarthritis on the right and bony foraminal narrowing on the right that could be affecting the right C4 nerve.  There is a disc bulge at C6-C7 that appears slightly improved.  C5-C6 herniated disc has improved [JK]    Clinical Course User Index [JK] Trish Furl, MD                                 Medical Decision Making Problems Addressed: Cervical disc disease: chronic illness or injury with exacerbation, progression, or side effects of treatment  Amount and/or Complexity of Data Reviewed Labs: ordered. Decision-making details documented in ED Course. Radiology: ordered and independent interpretation performed.  Risk Prescription drug management.   Patient presented to the ED for evaluation of worsening neck pain.  Patient does have history of cervical disc disease.  He also recently started having lower back pain and had an MRI in the last couple days  showing some lumbar disc disease.  Patient was  concerned that his cervical spine was getting worse as he was concerned it was affecting his breathing. Patient's ED workup was overall reassuring.  He had a stable hemoglobin.  His metabolic panel did not show any signs of hepatitis or pancreatitis.    Patient is breathing without difficulty emergency room he has normal lung sounds.  Normal oxygenation.  There is no signs of any respiratory paralysis.  MRI was performed and it does show cervical disease but there is no impingement of the spinal cord.  I discussed the case with neurosurgery.  He does not require any emergent surgery.  Patient is stable for outpatient discharge and continued elective surgery as planned.       Final Clinical Impression(s) / ED Diagnoses Final diagnoses:  Cervical disc disease    Rx / DC Orders ED Discharge Orders     None         Trish Furl, MD 12/10/23 1418

## 2023-12-10 NOTE — Telephone Encounter (Signed)
Orders Placed This Encounter  Procedures  . Ambulatory referral to Spine Surgery    Referral Priority:   Routine    Referral Type:   Surgical    Referral Reason:   Specialty Services Required    Requested Specialty:   Neurosurgery    Number of Visits Requested:   1

## 2023-12-11 ENCOUNTER — Encounter: Payer: Self-pay | Admitting: Podiatry

## 2023-12-13 ENCOUNTER — Ambulatory Visit (HOSPITAL_COMMUNITY): Admission: RE | Admit: 2023-12-13 | Source: Home / Self Care | Admitting: Neurosurgery

## 2023-12-13 ENCOUNTER — Encounter (HOSPITAL_COMMUNITY): Admission: RE | Payer: Self-pay | Source: Home / Self Care

## 2023-12-13 SURGERY — ANTERIOR CERVICAL DECOMPRESSION/DISCECTOMY FUSION 2 LEVELS
Anesthesia: General

## 2024-04-02 ENCOUNTER — Other Ambulatory Visit: Payer: Self-pay | Admitting: Neurosurgery

## 2024-04-08 ENCOUNTER — Encounter: Payer: Self-pay | Admitting: Family Medicine

## 2024-04-08 DIAGNOSIS — M51369 Other intervertebral disc degeneration, lumbar region without mention of lumbar back pain or lower extremity pain: Secondary | ICD-10-CM

## 2024-04-08 DIAGNOSIS — Q7649 Other congenital malformations of spine, not associated with scoliosis: Secondary | ICD-10-CM

## 2024-04-08 NOTE — Telephone Encounter (Signed)
 Ref placed.

## 2024-04-09 ENCOUNTER — Other Ambulatory Visit: Payer: Self-pay

## 2024-04-09 ENCOUNTER — Emergency Department (HOSPITAL_BASED_OUTPATIENT_CLINIC_OR_DEPARTMENT_OTHER)

## 2024-04-09 ENCOUNTER — Encounter (HOSPITAL_BASED_OUTPATIENT_CLINIC_OR_DEPARTMENT_OTHER): Payer: Self-pay

## 2024-04-09 ENCOUNTER — Other Ambulatory Visit (HOSPITAL_BASED_OUTPATIENT_CLINIC_OR_DEPARTMENT_OTHER): Payer: Self-pay

## 2024-04-09 ENCOUNTER — Emergency Department (HOSPITAL_BASED_OUTPATIENT_CLINIC_OR_DEPARTMENT_OTHER)
Admission: EM | Admit: 2024-04-09 | Discharge: 2024-04-09 | Disposition: A | Discharge status: Home / Self Care | Attending: Emergency Medicine | Admitting: Emergency Medicine

## 2024-04-09 DIAGNOSIS — M542 Cervicalgia: Secondary | ICD-10-CM | POA: Diagnosis not present

## 2024-04-09 DIAGNOSIS — R079 Chest pain, unspecified: Secondary | ICD-10-CM | POA: Diagnosis present

## 2024-04-09 DIAGNOSIS — R0602 Shortness of breath: Secondary | ICD-10-CM | POA: Insufficient documentation

## 2024-04-09 DIAGNOSIS — M545 Low back pain, unspecified: Secondary | ICD-10-CM | POA: Diagnosis not present

## 2024-04-09 DIAGNOSIS — Z87891 Personal history of nicotine dependence: Secondary | ICD-10-CM | POA: Diagnosis not present

## 2024-04-09 LAB — CBC WITH DIFFERENTIAL/PLATELET
Abs Immature Granulocytes: 0.01 K/uL (ref 0.00–0.07)
Basophils Absolute: 0 K/uL (ref 0.0–0.1)
Basophils Relative: 1 %
Eosinophils Absolute: 0.1 K/uL (ref 0.0–0.5)
Eosinophils Relative: 1 %
HCT: 37.6 % — ABNORMAL LOW (ref 39.0–52.0)
Hemoglobin: 11.6 g/dL — ABNORMAL LOW (ref 13.0–17.0)
Immature Granulocytes: 0 %
Lymphocytes Relative: 21 %
Lymphs Abs: 1.1 K/uL (ref 0.7–4.0)
MCH: 19.1 pg — ABNORMAL LOW (ref 26.0–34.0)
MCHC: 30.9 g/dL (ref 30.0–36.0)
MCV: 61.9 fL — ABNORMAL LOW (ref 80.0–100.0)
Monocytes Absolute: 0.3 K/uL (ref 0.1–1.0)
Monocytes Relative: 6 %
Neutro Abs: 3.9 K/uL (ref 1.7–7.7)
Neutrophils Relative %: 71 %
Platelets: 184 K/uL (ref 150–400)
RBC: 6.07 MIL/uL — ABNORMAL HIGH (ref 4.22–5.81)
RDW: 15.6 % — ABNORMAL HIGH (ref 11.5–15.5)
WBC: 5.4 K/uL (ref 4.0–10.5)
nRBC: 0 % (ref 0.0–0.2)

## 2024-04-09 LAB — COMPREHENSIVE METABOLIC PANEL WITH GFR
ALT: 14 U/L (ref 0–44)
AST: 18 U/L (ref 15–41)
Albumin: 4.8 g/dL (ref 3.5–5.0)
Alkaline Phosphatase: 53 U/L (ref 38–126)
Anion gap: 12 (ref 5–15)
BUN: 12 mg/dL (ref 6–20)
CO2: 24 mmol/L (ref 22–32)
Calcium: 9.2 mg/dL (ref 8.9–10.3)
Chloride: 105 mmol/L (ref 98–111)
Creatinine, Ser: 1.24 mg/dL (ref 0.61–1.24)
GFR, Estimated: >60 mL/min (ref >60)
Glucose, Bld: 107 mg/dL — ABNORMAL HIGH (ref 70–99)
Potassium: 3.9 mmol/L (ref 3.5–5.1)
Sodium: 141 mmol/L (ref 135–145)
Total Bilirubin: 0.4 mg/dL (ref 0.0–1.2)
Total Protein: 7.1 g/dL (ref 6.5–8.1)

## 2024-04-09 LAB — LIPASE, BLOOD: Lipase: 42 U/L (ref 11–51)

## 2024-04-09 LAB — D-DIMER, QUANTITATIVE: D-Dimer, Quant: <0.27 ug{FEU}/mL (ref 0.00–0.50)

## 2024-04-09 LAB — TROPONIN T, HIGH SENSITIVITY: Troponin T High Sensitivity: <15 ng/L (ref 0–19)

## 2024-04-09 MED ORDER — HYDROMORPHONE HCL 1 MG/ML IJ SOLN
1.0000 mg | Freq: Once | INTRAMUSCULAR | Status: AC
  Administered 2024-04-09: 1 mg via INTRAVENOUS
  Filled 2024-04-09: qty 1

## 2024-04-09 MED ORDER — DEXAMETHASONE SODIUM PHOSPHATE 10 MG/ML IJ SOLN
10.0000 mg | Freq: Once | INTRAMUSCULAR | Status: AC
  Administered 2024-04-09: 10 mg via INTRAVENOUS
  Filled 2024-04-09: qty 1

## 2024-04-09 MED ORDER — METHYLPREDNISOLONE 4 MG PO TBPK
ORAL_TABLET | ORAL | 0 refills | Status: DC
  Filled 2024-04-09: qty 21, 6d supply, fill #0

## 2024-04-09 NOTE — Discharge Instructions (Signed)
 Take next dose of steroids tomorrow.  Continue your pain management as prescribed as well from her primary doctors.  Follow-up with your primary team.  Return if symptoms worsen.

## 2024-04-09 NOTE — ED Triage Notes (Addendum)
 Pt states that he had a hard time sleeping last night - 1 hr ago central cp and difficulty breathing onset. Pain does radiate to both sides of jaw. No n/v/dizziness.

## 2024-04-09 NOTE — ED Provider Notes (Signed)
 [place] [facility]MENT AT [facility] Provider Note   CSN: [id number] Arrival date & time: 04/09/24  0725     Patient presents with: Chest Pain   Raymond [name] is a 38 y.o. male.   Patient here chest pain shortness of breath since this morning.  History of pneumothorax status post pleurodesis, chronic neck and low back pain.  Former smoker.  Denies any weakness numbness tingling.  Nothing makes it worse or better.  Takes chronic narcotics for his chronic back and neck pain.  Denies any fever or chills.  Denies any recent surgery or travel.  Nothing makes it worse or better.  Denies any cough or sputum production.  The history is provided by the patient.       Prior to Admission medications   Medication Sig Start Date End Date Taking? Authorizing Provider  methylPREDNISolone  (MEDROL  DOSEPAK) 4 MG TBPK tablet Follow package insert 04/09/24  Yes Anntionette Madkins, DO  amphetamine-dextroamphetamine (ADDERALL XR) 30 MG 24 hr capsule Take 30 mg by mouth every morning. 09/14/22   [provider]  amphetamine-dextroamphetamine (ADDERALL) 10 MG tablet Take 10 mg by mouth daily in the afternoon. 09/14/22   [provider]  baclofen  (LIORESAL ) 10 MG tablet Take 10 mg by mouth daily.    [provider]  busPIRone  (BUSPAR ) 10 MG tablet TAKE 1 TABLET(10 MG) BY MOUTH THREE TIMES DAILY 07/23/18   Alvan Dorothyann BIRCH, MD  gabapentin  (NEURONTIN ) 300 MG capsule Take 1 capsule (300 mg total) by mouth 3 (three) times daily for 14 days. Patient not taking: Reported on 12/04/2023 12/03/23 12/17/23  Zelaya, Oscar A, PA-C  naloxone [facility]) nasal spray 4 mg/0.1 mL Place 0.4 mg into the nose once. PRN 07/03/23   [provider]  olmesartan (BENICAR) 5 MG tablet Take 5 mg by mouth at bedtime.    [provider]  oxyCODONE -acetaminophen  (PERCOCET) 7.5-325 MG tablet Take 1 tablet by mouth in the morning, at noon, in the evening, and at bedtime.    [provider]  predniSONE  (DELTASONE ) 10 MG tablet Take 10-60 mg by mouth See admin instructions. Day 1 take 6 tablets Day 2 take 6 tablets Day 3 take 5 tablets Day 4 take 5 tablets Day 5 take 4 tablets Day 6 take 4 tablets Day 7 take 3 tablets Day 8 take 3 tablets Day 9 take 2 tablets Day 10 take 2 tablets Day 11 take 1 tablet Day 12 take 1 tablet    [provider]  pregabalin  (LYRICA ) 200 MG capsule Take 200 mg by mouth at bedtime.    [provider]  SPIRIVA RESPIMAT 1.25 MCG/ACT AERS Inhale 2 puffs into the lungs daily.    [provider]  tiZANidine (ZANAFLEX) 4 MG tablet Take 4 mg by mouth 3 (three) times daily.    [provider]  VENTOLIN HFA 108 (90 Base) MCG/ACT inhaler Inhale 1-2 puffs into the lungs every 6 (six) hours as needed for shortness of breath or wheezing.    [provider]  zolpidem  (AMBIEN ) 10 MG tablet Take 10 mg by mouth at bedtime. 02/15/17   [provider]  zonisamide (ZONEGRAN) 50 MG capsule Take 100 mg by mouth at bedtime.    [provider]    Allergies: Cyclobenzaprine     Review of Systems  Updated Vital Signs BP (!) 134/95 (BP Location: Right Arm)   Pulse 80   Temp 97.8 F (36.6 C) (Oral)   Resp 16  Ht 5' 8 (1.727 m)   Wt 65.8 kg   SpO2 100%   BMI 22.05 kg/m   Physical Exam Vitals and nursing note reviewed.  Constitutional:      General: He is not in acute distress.    Appearance: He is well-developed.  HENT:     Head: Normocephalic and atraumatic.  Eyes:     Conjunctiva/sclera: Conjunctivae normal.  Cardiovascular:     Rate and Rhythm: Normal rate and regular rhythm.     Heart sounds: No murmur heard. Pulmonary:     Effort: Pulmonary effort is normal. No respiratory distress.     Breath sounds: Normal breath sounds. No decreased breath sounds or wheezing.  Abdominal:     Palpations: Abdomen is soft.     Tenderness: There is no abdominal tenderness.   Musculoskeletal:        General: No swelling.     Cervical back: Neck supple.  Skin:    General: Skin is warm and dry.     Capillary Refill: Capillary refill takes less than 2 seconds.  Neurological:     General: No focal deficit present.     Mental Status: He is alert.  Psychiatric:        Mood and Affect: Mood normal.     (all labs ordered are listed, but only abnormal results are displayed) Labs Reviewed  CBC WITH DIFFERENTIAL/PLATELET - Abnormal; Notable for the following components:      Result Value   RBC 6.07 (*)    Hemoglobin 11.6 (*)    HCT 37.6 (*)    MCV 61.9 (*)    MCH 19.1 (*)    RDW 15.6 (*)    All other components within normal limits  COMPREHENSIVE METABOLIC PANEL WITH GFR - Abnormal; Notable for the following components:   Glucose, Bld 107 (*)    All other components within normal limits  LIPASE, BLOOD  D-DIMER, QUANTITATIVE  TROPONIN T, HIGH SENSITIVITY    EKG: EKG Interpretation Date/Time:  Thursday April 09 2024 07:35:05 EDT Ventricular Rate:  77 PR Interval:  152 QRS Duration:  91 QT Interval:  377 QTC Calculation: 427 R Axis:   79  Text Interpretation: Sinus rhythm Confirmed by Ruthe Cornet [id number]) on 04/09/2024 7:41:39 AM  Radiology: [facility] Chest Portable 1 View Result Date: 04/09/2024 EXAM: 1 VIEW XRAY OF THE CHEST 04/09/2024 08:02:22 AM COMPARISON: PA and lateral radiographs of the chest dated 04/07/2010 and CT of the chest dated 11/13/2003. CLINICAL HISTORY: SOB. Pt states that he had a hard time sleeping last night - 1 hr ago central cp and difficulty breathing onset. Pain does radiate to both sides of jaw. No n/v/dizziness. FINDINGS: LUNGS AND PLEURA: Mildly prominent hazy and streaky opacities overlying the left lung which appear to correspond with pleural thickening and pleural plaques noted on the previous chest CT. No focal pulmonary opacity. No pulmonary edema. No pleural effusion. No pneumothorax. HEART AND MEDIASTINUM: No acute  abnormality of the cardiac and mediastinal silhouettes. BONES AND SOFT TISSUES: Mild elevation of the left hemidiaphragm. Surgical suture material present medially within the left lung apex. No acute osseous abnormality. IMPRESSION: 1. No acute process. 2. Mildly prominent hazy and streaky opacities overlying the left lung, corresponding with pleural thickening and pleural plaques noted on the previous chest CT. Electronically signed by: Evalene Coho MD 04/09/2024 08:08 AM EDT RP Workstation: [id number]     Procedures   Medications Ordered in the ED  dexamethasone  (DECADRON ) injection 10 mg (has no  administration in time range)  HYDROmorphone  (DILAUDID ) injection 1 mg (1 mg Intravenous Given 04/09/24 0802)                                    Medical Decision Making Amount and/or Complexity of Data Reviewed Labs: ordered. Radiology: ordered.  Risk Prescription drug management.   Selinda LITTIE Ghent is here with chest pain.  History of chronic neck and back pain, pneumothorax with pleurodesis in the past.  Overall well-appearing.  Clear breath sounds.  Reassuring vitals.  EKG shows sinus rhythm.  Differential diagnosis includes ACS PE pneumothorax pneumonia infectious process MSK process, reflux process.  Will give IV Dilaudid  check troponin D-dimer chest x-ray basic labs.  EKG shows sinus rhythm.  No ischemic changes.  Overall per my review and interpretation of labs troponins normal.  D-dimer normal.  No significant leukocytosis anemia or electrolyte abnormality.  Chest x-ray shows no evidence of pneumothorax or pneumonia.  Overall I reviewed interpreted labs and images.  I do think that this is likely an inflammatory process/muscular process.  Will start him on a Medrol  Dosepak.  I have no concern for ACS or other acute process.  I believe 1 troponin sufficient as his pains been over 2 hours.  Overall continue his chronic pain management.  Follow-up with primary care.  Return if symptoms  worsen.  This chart was dictated using voice recognition software.  Despite best efforts to proofread,  errors can occur which can change the documentation meaning.      Final diagnoses:  Nonspecific chest pain    ED Discharge Orders          Ordered    methylPREDNISolone  (MEDROL  DOSEPAK) 4 MG TBPK tablet        04/09/24 0857               Ruthe Cornet, DO 04/09/24 9141

## 2024-04-14 ENCOUNTER — Encounter: Payer: Self-pay | Admitting: Family Medicine

## 2024-04-15 NOTE — Pre-Procedure Instructions (Signed)
 Surgical Instructions   Your procedure is scheduled on April 21, 2024. Report to [facility] Main Entrance A at 6:00 A.M., then check in with the Admitting office. Any questions or running late day of surgery: call [phone]  Questions prior to your surgery date: call [phone], Monday-Friday, 8am-4pm. If you experience any cold or flu symptoms such as cough, fever, chills, shortness of breath, etc. between now and your scheduled surgery, please notify us  at the above number.     Remember:  Do not eat or drink after midnight the night before your surgery   Take these medicines the morning of surgery with A SIP OF WATER: baclofen  (LIORESAL )  busPIRone  (BUSPAR )  methylPREDNISolone  (MEDROL  DOSEPAK)  oxyCODONE -acetaminophen  (PERCOCET)  SPIRIVA RESPIMAT Inhaler tiZANidine (ZANAFLEX)    May take these medicines IF NEEDED: naloxone (NARCAN) nasal spray  VENTOLIN HFA inhaler - please bring inhaler with you morning of surgery   One week prior to surgery, STOP taking any Aspirin (unless otherwise instructed by your surgeon) Aleve , Naproxen , Ibuprofen, Motrin, Advil, Goody's, BC's, all herbal medications, fish oil, and non-prescription vitamins.                     Do NOT Smoke (Tobacco/Vaping) for 24 hours prior to your procedure.  If you use a CPAP at night, you may bring your mask/headgear for your overnight stay.   You will be asked to remove any contacts, glasses, piercing's, hearing aid's, dentures/partials prior to surgery. Please bring cases for these items if needed.    Patients discharged the day of surgery will not be allowed to drive home, and someone needs to stay with them for 24 hours.  SURGICAL WAITING ROOM VISITATION Patients may have no more than 2 support people in the waiting area - these visitors may rotate.   Pre-op nurse will coordinate an appropriate time for 1 ADULT support person, who may not rotate, to accompany patient in pre-op.  Children under  the age of 71 must have an adult with them who is not the patient and must remain in the main waiting area with an adult.  If the patient needs to stay at the hospital during part of their recovery, the visitor guidelines for inpatient rooms apply.  Please refer to the [facility] website for the visitor guidelines for any additional information.   If you received a COVID test during your pre-op visit  it is requested that you wear a mask when out in public, stay away from anyone that may not be feeling well and notify your surgeon if you develop symptoms. If you have been in contact with anyone that has tested positive in the last 10 days please notify you surgeon.      Pre-operative 5 CHG Bathing Instructions   You can play a key role in reducing the risk of infection after surgery. Your skin needs to be as free of germs as possible. You can reduce the number of germs on your skin by washing with CHG (chlorhexidine  gluconate) soap before surgery. CHG is an antiseptic soap that kills germs and continues to kill germs even after washing.   DO NOT use if you have an allergy to chlorhexidine /CHG or antibacterial soaps. If your skin becomes reddened or irritated, stop using the CHG and notify one of our RNs at [phone].   Please shower with the CHG soap starting 4 days before surgery using the following schedule:     Please keep in mind the following:  DO NOT shave, including legs and underarms, starting the day of your first shower.   You may shave your face at any point before/day of surgery.  Place clean sheets on your bed the day you start using CHG soap. Use a clean washcloth (not used since being washed) for each shower. DO NOT sleep with pets once you start using the CHG.   CHG Shower Instructions:  Wash your face and private area with normal soap. If you choose to wash your hair, wash first with your normal shampoo.  After you use shampoo/soap, rinse your hair and body  thoroughly to remove shampoo/soap residue.  Turn the water OFF and apply about 3 tablespoons (45 ml) of CHG soap to a CLEAN washcloth.  Apply CHG soap ONLY FROM YOUR NECK DOWN TO YOUR TOES (washing for 3-5 minutes)  DO NOT use CHG soap on face, private areas, open wounds, or sores.  Pay special attention to the area where your surgery is being performed.  If you are having back surgery, having someone wash your back for you may be helpful. Wait 2 minutes after CHG soap is applied, then you may rinse off the CHG soap.  Pat dry with a clean towel  Put on clean clothes/pajamas   If you choose to wear lotion, please use ONLY the CHG-compatible lotions that are listed below.  Additional instructions for the day of surgery: DO NOT APPLY any lotions, deodorants, cologne, or perfumes.   Do not bring valuables to the hospital. [facility] is not responsible for any belongings/valuables. Do not wear nail polish, gel polish, artificial nails, or any other type of covering on natural nails (fingers and toes) Do not wear jewelry or makeup Put on clean/comfortable clothes.  Please brush your teeth.  Ask your nurse before applying any prescription medications to the skin.     CHG Compatible Lotions   Aveeno Moisturizing lotion  Cetaphil Moisturizing Cream  Cetaphil Moisturizing Lotion  Clairol Herbal Essence Moisturizing Lotion, Dry Skin  Clairol Herbal Essence Moisturizing Lotion, Extra Dry Skin  Clairol Herbal Essence Moisturizing Lotion, Normal Skin  Curel Age Defying Therapeutic Moisturizing Lotion with Alpha Hydroxy  Curel Extreme Care Body Lotion  Curel Soothing Hands Moisturizing Hand Lotion  Curel Therapeutic Moisturizing Cream, Fragrance-Free  Curel Therapeutic Moisturizing Lotion, Fragrance-Free  Curel Therapeutic Moisturizing Lotion, Original Formula  Eucerin Daily Replenishing Lotion  Eucerin Dry Skin Therapy Plus Alpha Hydroxy Crme  Eucerin Dry Skin Therapy Plus Alpha Hydroxy  Lotion  Eucerin Original Crme  Eucerin Original Lotion  Eucerin Plus Crme Eucerin Plus Lotion  Eucerin TriLipid Replenishing Lotion  Keri Anti-Bacterial Hand Lotion  Keri Deep Conditioning Original Lotion Dry Skin Formula Softly Scented  Keri Deep Conditioning Original Lotion, Fragrance Free Sensitive Skin Formula  Keri Lotion Fast Absorbing Fragrance Free Sensitive Skin Formula  Keri Lotion Fast Absorbing Softly Scented Dry Skin Formula  Keri Original Lotion  Keri Skin Renewal Lotion Keri Silky Smooth Lotion  Keri Silky Smooth Sensitive Skin Lotion  Nivea Body Creamy Conditioning Oil  Nivea Body Extra Enriched Lotion  Nivea Body Original Lotion  Nivea Body Sheer Moisturizing Lotion Nivea Crme  Nivea Skin Firming Lotion  NutraDerm 30 Skin Lotion  NutraDerm Skin Lotion  NutraDerm Therapeutic Skin Cream  NutraDerm Therapeutic Skin Lotion  ProShield Protective Hand Cream  Provon moisturizing lotion  Please read over the following fact sheets that you were given.

## 2024-04-16 ENCOUNTER — Encounter (HOSPITAL_COMMUNITY): Payer: Self-pay

## 2024-04-16 ENCOUNTER — Other Ambulatory Visit: Payer: Self-pay

## 2024-04-16 ENCOUNTER — Encounter (HOSPITAL_COMMUNITY)
Admission: RE | Admit: 2024-04-16 | Discharge: 2024-04-16 | Disposition: A | Discharge status: Home / Self Care | Source: Ambulatory Visit | Attending: Neurosurgery | Admitting: Neurosurgery

## 2024-04-16 VITALS — BP 148/92 | HR 82 | Temp 98.4°F | Resp 16 | Ht 68.0 in | Wt 147.1 lb

## 2024-04-16 DIAGNOSIS — Z01812 Encounter for preprocedural laboratory examination: Secondary | ICD-10-CM | POA: Insufficient documentation

## 2024-04-16 DIAGNOSIS — Z01818 Encounter for other preprocedural examination: Secondary | ICD-10-CM

## 2024-04-16 HISTORY — DX: Anxiety disorder, unspecified: F41.9

## 2024-04-16 HISTORY — DX: Depression, unspecified: F32.A

## 2024-04-16 HISTORY — DX: Dyspnea, unspecified: R06.00

## 2024-04-16 HISTORY — DX: Myoneural disorder, unspecified: G70.9

## 2024-04-16 HISTORY — DX: Headache, unspecified: R51.9

## 2024-04-16 HISTORY — DX: Disease of blood and blood-forming organs, unspecified: D75.9

## 2024-04-16 HISTORY — DX: Unspecified osteoarthritis, unspecified site: M19.90

## 2024-04-16 LAB — SURGICAL PCR SCREEN
MRSA, PCR: NEGATIVE
Staphylococcus aureus: POSITIVE — AB

## 2024-04-16 NOTE — Progress Notes (Signed)
 PCP - Dr. Dorothyann Byars Cardiologist - Denies  PPM/ICD - Denies Device Orders - n/a Rep Notified - n/a  Chest x-ray - 04/09/2024 EKG - 04/09/2024 Stress Test - Denies ECHO - Denies Cardiac Cath - Denies  Sleep Study - Denies CPAP - n/a  No DM  Last dose of GLP1 agonist- n/a GLP1 instructions: n/a  Blood Thinner Instructions: n/a Aspirin Instructions: n/a  NPO after midnight  COVID TEST- n/a   Anesthesia review: No. Pt BP elevated at PAT appointment. Pt has not been taking his olmesartan lately. Automatic recheck has DBP 100s. Manual recheck BP was 148/92. RN instructed pt to make sure to take his BP meds because if BP too high on DOS, there is a chance that surgery could be cancelled. Pt understood instructions  Patient denies shortness of breath, fever, cough and chest pain at PAT appointment. Pt denies any respiratory illness/infection in the last two months.    All instructions explained to the patient, with a verbal understanding of the material. Patient agrees to go over the instructions while at home for a better understanding. Patient also instructed to self quarantine after being tested for COVID-19. The opportunity to ask questions was provided.

## 2024-04-21 ENCOUNTER — Observation Stay (HOSPITAL_COMMUNITY)
Admission: RE | Admit: 2024-04-21 | Discharge: 2024-04-22 | Disposition: A | Discharge status: Home / Self Care | Attending: Neurosurgery | Admitting: Neurosurgery

## 2024-04-21 ENCOUNTER — Other Ambulatory Visit: Payer: Self-pay

## 2024-04-21 ENCOUNTER — Ambulatory Visit (HOSPITAL_BASED_OUTPATIENT_CLINIC_OR_DEPARTMENT_OTHER): Payer: Self-pay | Admitting: Anesthesiology

## 2024-04-21 ENCOUNTER — Encounter (HOSPITAL_COMMUNITY): Payer: Self-pay | Admitting: Neurosurgery

## 2024-04-21 ENCOUNTER — Ambulatory Visit (HOSPITAL_COMMUNITY): Payer: Self-pay | Admitting: Anesthesiology

## 2024-04-21 ENCOUNTER — Ambulatory Visit (HOSPITAL_COMMUNITY)
Admission: RE | Disposition: A | Discharge status: Home / Self Care | Payer: Self-pay | Source: Home / Self Care | Attending: Neurosurgery

## 2024-04-21 ENCOUNTER — Ambulatory Visit (HOSPITAL_COMMUNITY)

## 2024-04-21 DIAGNOSIS — M4712 Other spondylosis with myelopathy, cervical region: Principal | ICD-10-CM | POA: Diagnosis present

## 2024-04-21 DIAGNOSIS — M4722 Other spondylosis with radiculopathy, cervical region: Secondary | ICD-10-CM | POA: Diagnosis not present

## 2024-04-21 DIAGNOSIS — Z87891 Personal history of nicotine dependence: Secondary | ICD-10-CM

## 2024-04-21 DIAGNOSIS — Z79899 Other long term (current) drug therapy: Secondary | ICD-10-CM | POA: Diagnosis not present

## 2024-04-21 DIAGNOSIS — M4802 Spinal stenosis, cervical region: Principal | ICD-10-CM | POA: Insufficient documentation

## 2024-04-21 DIAGNOSIS — I10 Essential (primary) hypertension: Secondary | ICD-10-CM

## 2024-04-21 DIAGNOSIS — F418 Other specified anxiety disorders: Secondary | ICD-10-CM

## 2024-04-21 SURGERY — ANTERIOR CERVICAL DECOMPRESSION/DISCECTOMY FUSION 2 LEVELS
Anesthesia: General

## 2024-04-21 MED ORDER — HYDROCODONE-ACETAMINOPHEN 5-325 MG PO TABS: 1.0000 | ORAL_TABLET | ORAL | Status: DC | PRN

## 2024-04-21 MED ORDER — AMPHETAMINE-DEXTROAMPHET ER 10 MG PO CP24
30.0000 mg | ORAL_CAPSULE | Freq: Every morning | ORAL | Status: DC
  Filled 2024-04-21: qty 3
  Filled 2024-04-21: qty 1

## 2024-04-21 MED ORDER — ONDANSETRON HCL 4 MG PO TABS
4.0000 mg | ORAL_TABLET | Freq: Four times a day (QID) | ORAL | Status: DC | PRN
  Administered 2024-04-21: 4 mg via ORAL
  Filled 2024-04-21: qty 1

## 2024-04-21 MED ORDER — FENTANYL CITRATE (PF) 250 MCG/5ML IJ SOLN
INTRAMUSCULAR | Status: DC | PRN
  Administered 2024-04-21 (×2): 50 ug via INTRAVENOUS

## 2024-04-21 MED ORDER — ZOLPIDEM TARTRATE 5 MG PO TABS
10.0000 mg | ORAL_TABLET | Freq: Every day | ORAL | Status: DC
  Administered 2024-04-21: 10 mg via ORAL
  Filled 2024-04-21: qty 2

## 2024-04-21 MED ORDER — FENTANYL CITRATE (PF) 250 MCG/5ML IJ SOLN
INTRAMUSCULAR | Status: AC
  Filled 2024-04-21: qty 5

## 2024-04-21 MED ORDER — DEXAMETHASONE SODIUM PHOSPHATE 10 MG/ML IJ SOLN
INTRAMUSCULAR | Status: DC | PRN
Start: 2024-04-21 — End: 2024-04-21
  Administered 2024-04-21: 10 mg via INTRAVENOUS

## 2024-04-21 MED ORDER — SODIUM CHLORIDE 0.9 % IV SOLN: 250.0000 mL | INTRAVENOUS | Status: DC

## 2024-04-21 MED ORDER — IRBESARTAN 75 MG PO TABS
37.5000 mg | ORAL_TABLET | Freq: Every day | ORAL | Status: DC
  Filled 2024-04-21: qty 1

## 2024-04-21 MED ORDER — MUPIROCIN 2 % EX OINT: 1.0000 | TOPICAL_OINTMENT | Freq: Two times a day (BID) | CUTANEOUS | 0 refills | Status: AC

## 2024-04-21 MED ORDER — ONDANSETRON HCL 4 MG/2ML IJ SOLN: 4.0000 mg | Freq: Four times a day (QID) | INTRAMUSCULAR | Status: DC | PRN

## 2024-04-21 MED ORDER — ROCURONIUM BROMIDE 10 MG/ML (PF) SYRINGE
PREFILLED_SYRINGE | INTRAVENOUS | Status: DC | PRN
Start: 2024-04-21 — End: 2024-04-21
  Administered 2024-04-21 (×2): 10 mg via INTRAVENOUS
  Administered 2024-04-21: 50 mg via INTRAVENOUS
  Administered 2024-04-21: 10 mg via INTRAVENOUS

## 2024-04-21 MED ORDER — CHLORHEXIDINE GLUCONATE CLOTH 2 % EX PADS: 6.0000 | MEDICATED_PAD | Freq: Every day | CUTANEOUS | Status: DC

## 2024-04-21 MED ORDER — MUPIROCIN 2 % EX OINT
1.0000 | TOPICAL_OINTMENT | Freq: Two times a day (BID) | CUTANEOUS | Status: DC
  Administered 2024-04-21: 1 via NASAL
  Filled 2024-04-21: qty 22

## 2024-04-21 MED ORDER — OXYCODONE HCL 5 MG/5ML PO SOLN: 5.0000 mg | Freq: Once | ORAL | Status: DC | PRN

## 2024-04-21 MED ORDER — TIOTROPIUM BROMIDE MONOHYDRATE 1.25 MCG/ACT IN AERS: 2.0000 | INHALATION_SPRAY | Freq: Every day | RESPIRATORY_TRACT | Status: DC

## 2024-04-21 MED ORDER — LACTATED RINGERS IV SOLN: INTRAVENOUS | Status: DC

## 2024-04-21 MED ORDER — FENTANYL CITRATE (PF) 100 MCG/2ML IJ SOLN
INTRAMUSCULAR | Status: AC
  Filled 2024-04-21: qty 2

## 2024-04-21 MED ORDER — SUGAMMADEX SODIUM 200 MG/2ML IV SOLN
INTRAVENOUS | Status: DC | PRN
  Administered 2024-04-21: 200 mg via INTRAVENOUS

## 2024-04-21 MED ORDER — CHLORHEXIDINE GLUCONATE 0.12 % MT SOLN
15.0000 mL | Freq: Once | OROMUCOSAL | Status: AC
  Administered 2024-04-21: 15 mL via OROMUCOSAL
  Filled 2024-04-21: qty 15

## 2024-04-21 MED ORDER — MIDAZOLAM HCL 2 MG/2ML IJ SOLN
INTRAMUSCULAR | Status: DC | PRN
  Administered 2024-04-21: 2 mg via INTRAVENOUS

## 2024-04-21 MED ORDER — HYDROMORPHONE HCL 1 MG/ML IJ SOLN
1.0000 mg | INTRAMUSCULAR | Status: DC | PRN
  Administered 2024-04-21: 1 mg via INTRAVENOUS
  Filled 2024-04-21: qty 1

## 2024-04-21 MED ORDER — MIDAZOLAM HCL 2 MG/2ML IJ SOLN
INTRAMUSCULAR | Status: AC
  Filled 2024-04-21: qty 2

## 2024-04-21 MED ORDER — CHLORHEXIDINE GLUCONATE CLOTH 2 % EX PADS: 6.0000 | MEDICATED_PAD | Freq: Once | CUTANEOUS | Status: DC

## 2024-04-21 MED ORDER — OXYCODONE HCL 5 MG PO TABS
10.0000 mg | ORAL_TABLET | ORAL | Status: DC | PRN
  Administered 2024-04-21 – 2024-04-22 (×7): 10 mg via ORAL
  Filled 2024-04-21 (×7): qty 2

## 2024-04-21 MED ORDER — ONDANSETRON HCL 4 MG/2ML IJ SOLN
INTRAMUSCULAR | Status: DC | PRN
Start: 2024-04-21 — End: 2024-04-21
  Administered 2024-04-21: 4 mg via INTRAVENOUS

## 2024-04-21 MED ORDER — ORAL CARE MOUTH RINSE: 15.0000 mL | Freq: Once | OROMUCOSAL | Status: AC

## 2024-04-21 MED ORDER — ALBUTEROL SULFATE (2.5 MG/3ML) 0.083% IN NEBU: 2.5000 mg | INHALATION_SOLUTION | Freq: Four times a day (QID) | RESPIRATORY_TRACT | Status: DC | PRN

## 2024-04-21 MED ORDER — SODIUM CHLORIDE 0.9% FLUSH: 3.0000 mL | INTRAVENOUS | Status: DC | PRN

## 2024-04-21 MED ORDER — CEFAZOLIN SODIUM-DEXTROSE 2-4 GM/100ML-% IV SOLN
2.0000 g | INTRAVENOUS | Status: AC
  Administered 2024-04-21: 2 g via INTRAVENOUS
  Filled 2024-04-21: qty 100

## 2024-04-21 MED ORDER — ZONISAMIDE 100 MG PO CAPS
100.0000 mg | ORAL_CAPSULE | Freq: Every day | ORAL | Status: DC
  Administered 2024-04-21: 100 mg via ORAL
  Filled 2024-04-21: qty 1

## 2024-04-21 MED ORDER — ACETAMINOPHEN 325 MG PO TABS: 650.0000 mg | ORAL_TABLET | ORAL | Status: DC | PRN

## 2024-04-21 MED ORDER — AMPHETAMINE-DEXTROAMPHETAMINE 10 MG PO TABS
10.0000 mg | ORAL_TABLET | Freq: Every day | ORAL | Status: DC
Start: 2024-04-21 — End: 2024-04-22

## 2024-04-21 MED ORDER — OXYCODONE HCL 5 MG PO TABS
ORAL_TABLET | ORAL | Status: AC
  Filled 2024-04-21: qty 2

## 2024-04-21 MED ORDER — THROMBIN (RECOMBINANT) 5000 UNITS EX SOLR
CUTANEOUS | Status: DC | PRN
  Administered 2024-04-21: 10 mL via TOPICAL

## 2024-04-21 MED ORDER — UMECLIDINIUM BROMIDE 62.5 MCG/ACT IN AEPB
1.0000 | INHALATION_SPRAY | Freq: Every day | RESPIRATORY_TRACT | Status: DC
  Filled 2024-04-21: qty 7

## 2024-04-21 MED ORDER — FENTANYL CITRATE (PF) 100 MCG/2ML IJ SOLN
25.0000 ug | INTRAMUSCULAR | Status: DC | PRN
  Administered 2024-04-21 (×3): 50 ug via INTRAVENOUS

## 2024-04-21 MED ORDER — DEXAMETHASONE SODIUM PHOSPHATE 10 MG/ML IJ SOLN
INTRAMUSCULAR | Status: AC
  Filled 2024-04-21: qty 1

## 2024-04-21 MED ORDER — CEFAZOLIN SODIUM-DEXTROSE 1-4 GM/50ML-% IV SOLN
1.0000 g | Freq: Three times a day (TID) | INTRAVENOUS | Status: AC
  Administered 2024-04-21 (×2): 1 g via INTRAVENOUS
  Filled 2024-04-21 (×2): qty 50

## 2024-04-21 MED ORDER — THROMBIN 5000 UNITS EX KIT
PACK | CUTANEOUS | Status: AC
Start: 2024-04-21 — End: 2024-04-21
  Filled 2024-04-21: qty 2

## 2024-04-21 MED ORDER — PROPOFOL 10 MG/ML IV BOLUS
INTRAVENOUS | Status: AC
  Filled 2024-04-21: qty 20

## 2024-04-21 MED ORDER — MENTHOL 3 MG MT LOZG: 1.0000 | LOZENGE | OROMUCOSAL | Status: DC | PRN

## 2024-04-21 MED ORDER — KETAMINE HCL 50 MG/5ML IJ SOSY
PREFILLED_SYRINGE | INTRAMUSCULAR | Status: DC | PRN
  Administered 2024-04-21: 20 mg via INTRAVENOUS

## 2024-04-21 MED ORDER — PREGABALIN 100 MG PO CAPS
200.0000 mg | ORAL_CAPSULE | Freq: Every day | ORAL | Status: DC
  Administered 2024-04-21: 200 mg via ORAL
  Filled 2024-04-21: qty 2

## 2024-04-21 MED ORDER — PROPOFOL 10 MG/ML IV BOLUS
INTRAVENOUS | Status: DC | PRN
  Administered 2024-04-21: 200 mg via INTRAVENOUS

## 2024-04-21 MED ORDER — ACETAMINOPHEN 650 MG RE SUPP: 650.0000 mg | RECTAL | Status: DC | PRN

## 2024-04-21 MED ORDER — SODIUM CHLORIDE 0.9% FLUSH
3.0000 mL | Freq: Two times a day (BID) | INTRAVENOUS | Status: DC
  Administered 2024-04-21: 3 mL via INTRAVENOUS

## 2024-04-21 MED ORDER — TIZANIDINE HCL 4 MG PO TABS
4.0000 mg | ORAL_TABLET | Freq: Three times a day (TID) | ORAL | Status: DC
  Administered 2024-04-21 – 2024-04-22 (×3): 4 mg via ORAL
  Filled 2024-04-21 (×3): qty 1

## 2024-04-21 MED ORDER — DEXMEDETOMIDINE HCL IN NACL 80 MCG/20ML IV SOLN
INTRAVENOUS | Status: AC
Start: 2024-04-21 — End: 2024-04-21
  Filled 2024-04-21: qty 20

## 2024-04-21 MED ORDER — OXYCODONE HCL 5 MG PO TABS: 5.0000 mg | ORAL_TABLET | Freq: Once | ORAL | Status: DC | PRN

## 2024-04-21 MED ORDER — ROCURONIUM BROMIDE 10 MG/ML (PF) SYRINGE
PREFILLED_SYRINGE | INTRAVENOUS | Status: AC
  Filled 2024-04-21: qty 10

## 2024-04-21 MED ORDER — 0.9 % SODIUM CHLORIDE (POUR BTL) OPTIME
TOPICAL | Status: DC | PRN
  Administered 2024-04-21: 1000 mL

## 2024-04-21 MED ORDER — PROPOFOL 10 MG/ML IV BOLUS
INTRAVENOUS | Status: AC
Start: 2024-04-21 — End: 2024-04-21
  Filled 2024-04-21: qty 20

## 2024-04-21 MED ORDER — ACETAMINOPHEN 10 MG/ML IV SOLN
INTRAVENOUS | Status: AC
  Filled 2024-04-21: qty 100

## 2024-04-21 MED ORDER — LIDOCAINE 2% (20 MG/ML) 5 ML SYRINGE
INTRAMUSCULAR | Status: DC | PRN
Start: 2024-04-21 — End: 2024-04-21
  Administered 2024-04-21: 100 mg via INTRAVENOUS

## 2024-04-21 MED ORDER — LIDOCAINE 2% (20 MG/ML) 5 ML SYRINGE
INTRAMUSCULAR | Status: AC
Start: 2024-04-21 — End: 2024-04-21
  Filled 2024-04-21: qty 5

## 2024-04-21 MED ORDER — PHENOL 1.4 % MT LIQD: 1.0000 | OROMUCOSAL | Status: DC | PRN

## 2024-04-21 MED ORDER — PHENYLEPHRINE HCL-NACL 20-0.9 MG/250ML-% IV SOLN
INTRAVENOUS | Status: DC | PRN
  Administered 2024-04-21: 50 ug/min via INTRAVENOUS

## 2024-04-21 MED ORDER — BACLOFEN 10 MG PO TABS
10.0000 mg | ORAL_TABLET | Freq: Every day | ORAL | Status: DC
  Administered 2024-04-21 – 2024-04-22 (×2): 10 mg via ORAL
  Filled 2024-04-21 (×2): qty 1

## 2024-04-21 MED ORDER — KETAMINE HCL 50 MG/5ML IJ SOSY
PREFILLED_SYRINGE | INTRAMUSCULAR | Status: AC
  Filled 2024-04-21: qty 5

## 2024-04-21 MED ORDER — CHLORHEXIDINE GLUCONATE 4 % EX SOLN: 1.0000 | CUTANEOUS | 1 refills | Status: DC

## 2024-04-21 MED ORDER — ONDANSETRON HCL 4 MG/2ML IJ SOLN
INTRAMUSCULAR | Status: AC
  Filled 2024-04-21: qty 2

## 2024-04-21 SURGICAL SUPPLY — 43 items
BAG COUNTER SPONGE SURGICOUNT (BAG) ×1 IMPLANT
BAND RUBBER #18 3X1/16 STRL (MISCELLANEOUS) ×2 IMPLANT
BENZOIN TINCTURE PRP APPL 2/3 (GAUZE/BANDAGES/DRESSINGS) ×1 IMPLANT
BIT DRILL 13 (BIT) IMPLANT
BUR MATCHSTICK NEURO 3.0 LAGG (BURR) ×1 IMPLANT
CANISTER SUCTION 3000ML PPV (SUCTIONS) ×1 IMPLANT
DRAPE C-ARM 42X72 X-RAY (DRAPES) ×2 IMPLANT
DRAPE LAPAROTOMY 100X72 PEDS (DRAPES) ×1 IMPLANT
DRAPE MICROSCOPE SLANT 54X150 (MISCELLANEOUS) ×1 IMPLANT
DURAPREP 6ML APPLICATOR 50/CS (WOUND CARE) ×1 IMPLANT
ELECT COATED BLADE 2.86 ST (ELECTRODE) ×1 IMPLANT
ELECTRODE REM PT RTRN 9FT ADLT (ELECTROSURGICAL) ×1 IMPLANT
GAUZE 4X4 16PLY ~~LOC~~+RFID DBL (SPONGE) IMPLANT
GAUZE SPONGE 4X4 12PLY STRL (GAUZE/BANDAGES/DRESSINGS) ×1 IMPLANT
GLOVE ECLIPSE 9.0 STRL (GLOVE) ×1 IMPLANT
GLOVE EXAM NITRILE XL STR (GLOVE) IMPLANT
GOWN STRL REUS W/ TWL LRG LVL3 (GOWN DISPOSABLE) IMPLANT
GOWN STRL REUS W/ TWL XL LVL3 (GOWN DISPOSABLE) ×1 IMPLANT
GOWN STRL REUS W/TWL 2XL LVL3 (GOWN DISPOSABLE) IMPLANT
HALTER HD/CHIN CERV TRACTION D (MISCELLANEOUS) ×1 IMPLANT
HEMOSTAT POWDER KIT SURGIFOAM (HEMOSTASIS) IMPLANT
HEMOSTAT SURGICEL 2X14 (HEMOSTASIS) IMPLANT
KIT BASIN OR (CUSTOM PROCEDURE TRAY) ×1 IMPLANT
KIT TURNOVER KIT B (KITS) ×1 IMPLANT
NDL SPNL 20GX3.5 QUINCKE YW (NEEDLE) ×1 IMPLANT
NEEDLE SPNL 20GX3.5 QUINCKE YW (NEEDLE) ×1 IMPLANT
NS IRRIG 1000ML POUR BTL (IV SOLUTION) ×1 IMPLANT
PACK LAMINECTOMY NEURO (CUSTOM PROCEDURE TRAY) ×1 IMPLANT
PAD ARMBOARD POSITIONER FOAM (MISCELLANEOUS) ×3 IMPLANT
PLATE ELITE 42MM (Plate) IMPLANT
SCREW ST 13X4XST VA NS SPNE (Screw) IMPLANT
SPACER SPNL 11X14X6XPEEK CVD (Cage) IMPLANT
SPONGE INTESTINAL PEANUT (DISPOSABLE) ×1 IMPLANT
SPONGE SURGIFOAM ABS GEL SZ50 (HEMOSTASIS) ×1 IMPLANT
STRIP CLOSURE SKIN 1/2X4 (GAUZE/BANDAGES/DRESSINGS) ×1 IMPLANT
SUT VIC AB 3-0 SH 8-18 (SUTURE) ×1 IMPLANT
SUT VIC AB 4-0 RB1 18 (SUTURE) ×1 IMPLANT
TAPE CLOTH 4X10 WHT NS (GAUZE/BANDAGES/DRESSINGS) ×1 IMPLANT
TAPE PAPER 3X10 WHT MICROPORE (GAUZE/BANDAGES/DRESSINGS) IMPLANT
TOWEL GREEN STERILE (TOWEL DISPOSABLE) ×1 IMPLANT
TOWEL GREEN STERILE FF (TOWEL DISPOSABLE) ×1 IMPLANT
TRAP SPECIMEN MUCUS 40CC (MISCELLANEOUS) ×1 IMPLANT
WATER STERILE IRR 1000ML POUR (IV SOLUTION) ×1 IMPLANT

## 2024-04-21 NOTE — Brief Op Note (Signed)
 04/21/2024  10:27 AM  PATIENT:  Selinda LITTIE Ghent  38 y.o. male  PRE-OPERATIVE DIAGNOSIS:  Cervical spondylosis with radiculopathy  POST-OPERATIVE DIAGNOSIS:  Cervical spondylosis with radiculopathy  PROCEDURE:  Procedure(s) with comments: ANTERIOR CERVICAL DECOMPRESSION/DISCECTOMY FUSION CERVICAL FIVE-CERVICAL SIX, CERVICAL SIX-CERVICAL SEVEN (N/A) - ACDF - C5-C6 - C6-C7  SURGEON:  Surgeons and Role:    DEWAINE Louis Shove, MD - Primary  PHYSICIAN ASSISTANT:   ASSISTANTSBETHA Jennetta PIETY   ANESTHESIA:   general  EBL:  50cc   BLOOD ADMINISTERED:none  DRAINS: none   LOCAL MEDICATIONS USED:  NONE  SPECIMEN:  No Specimen  DISPOSITION OF SPECIMEN:  N/A  COUNTS:  YES  TOURNIQUET:  * No tourniquets in log *  DICTATION: .Dragon Dictation  PLAN OF CARE: Admit for overnight observation  PATIENT DISPOSITION:  PACU - hemodynamically stable.   Delay start of Pharmacological VTE agent (>24hrs) due to surgical blood loss or risk of bleeding: yes

## 2024-04-21 NOTE — Anesthesia Preprocedure Evaluation (Signed)
 Anesthesia Evaluation  Patient identified by MRN, date of birth, ID band Patient awake    Reviewed: Allergy & Precautions, H&P , NPO status , Patient's Chart, lab work & pertinent test results  Airway Mallampati: II   Neck ROM: full    Dental   Pulmonary shortness of breath, former smoker   breath sounds clear to auscultation       Cardiovascular hypertension,  Rhythm:regular Rate:Normal     Neuro/Psych  Headaches PSYCHIATRIC DISORDERS Anxiety Depression       GI/Hepatic   Endo/Other    Renal/GU      Musculoskeletal  (+) Arthritis ,    Abdominal   Peds  Hematology   Anesthesia Other Findings   Reproductive/Obstetrics                              Anesthesia Physical Anesthesia Plan  ASA: 3  Anesthesia Plan: General   Post-op Pain Management:    Induction: Intravenous  PONV Risk Score and Plan: 2 and Ondansetron , Dexamethasone , Midazolam  and Treatment may vary due to age or medical condition  Airway Management Planned: Oral ETT and Video Laryngoscope Planned  Additional Equipment:   Intra-op Plan:   Post-operative Plan: Extubation in OR  Informed Consent: I have reviewed the patients History and Physical, chart, labs and discussed the procedure including the risks, benefits and alternatives for the proposed anesthesia with the patient or authorized representative who has indicated his/her understanding and acceptance.     Dental advisory given  Plan Discussed with: CRNA, Anesthesiologist and Surgeon  Anesthesia Plan Comments:         Anesthesia Quick Evaluation

## 2024-04-21 NOTE — H&P (Signed)
 Raymond [name] is an 38 y.o. male.   Chief Complaint: Walking HPI: 38 year old male with history of chronic and progressively worsening neck pain with intermittent cervical radicular symptoms failing conservative management.  Workup demonstrates evidence of significant disc degeneration with associated spondylosis and stenosis at C5-6 and C6-7.  Patient presents now for two-level anterior cervical decompression and fusion in hopes improving his symptoms.  Past Medical History:  Diagnosis Date   ADHD (attention deficit hyperactivity disorder)    Anxiety    Arthritis    Blood dyscrasia    Thalassemia Minor   Depression    Dyspnea    intermittently with exertion   Headache    Migraines   Hypertension    Neuromuscular disorder (HCC)    Bilateral neuropathy arms/hands   Spontaneous pneumothorax    Two in the past 2011   WEIGHT LOSS, ABNORMAL 09/30/2007   Qualifier: Diagnosis of  By: Raymond [name]      Past Surgical History:  Procedure Laterality Date   CHEST TUBE INSERTION  2009   x2 - spontaneous pneumothorax   INGUINAL HERNIA REPAIR Left 10/12/2013   INGUINAL HERNIA REPAIR Left 2015   PYELOPLASTY WITH STENT PLACEMENT, ROBOT ASSISTED, LAPAROSCOPIC Left 01/22/2024   STENT REMOVAL  02/28/2024   [facility]    Family History  Problem Relation Age of Onset   Hypertension Father    Anxiety disorder Mother    Depression Mother    Drug abuse Brother    Coronary artery disease Maternal Grandfather    ADD / ADHD Neg Hx    Alcohol abuse Neg Hx    Bipolar disorder Neg Hx    Dementia Neg Hx    OCD Neg Hx    Paranoid behavior Neg Hx    Schizophrenia Neg Hx    Thyroid disease Neg Hx    Social History:  reports that he quit smoking about 9 years ago. His smoking use included e-cigarettes and cigarettes. He has never used smokeless tobacco. He reports that he does not currently use alcohol. He reports that he does not currently use drugs after having used the following drugs:  Marijuana.  Allergies:  Allergies  Allergen Reactions   Cyclobenzaprine  Other (See Comments)    Dry mouth    Medications Prior to Admission  Medication Sig Dispense Refill   amphetamine -dextroamphetamine  (ADDERALL  XR) 30 MG 24 hr capsule Take 30 mg by mouth every morning.     amphetamine -dextroamphetamine  (ADDERALL ) 10 MG tablet Take 10 mg by mouth daily in the afternoon.     baclofen  (LIORESAL ) 10 MG tablet Take 10 mg by mouth daily.     methylPREDNISolone  (MEDROL  DOSEPAK) 4 MG TBPK tablet Take as directed per package instructions. 21 each 0   olmesartan (BENICAR) 5 MG tablet Take 5 mg by mouth at bedtime.     oxyCODONE -acetaminophen  (PERCOCET) 7.5-325 MG tablet Take 1 tablet by mouth in the morning, at noon, in the evening, and at bedtime.     pregabalin  (LYRICA ) 200 MG capsule Take 200 mg by mouth at bedtime.     SPIRIVA  RESPIMAT 1.25 MCG/ACT AERS Inhale 2 puffs into the lungs daily.     tiZANidine  (ZANAFLEX ) 4 MG tablet Take 4 mg by mouth 3 (three) times daily.     VENTOLIN  HFA 108 (90 Base) MCG/ACT inhaler Inhale 1-2 puffs into the lungs every 6 (six) hours as needed for shortness of breath or wheezing.     zolpidem  (AMBIEN ) 10 MG tablet Take 10 mg by mouth  at bedtime.     zonisamide  (ZONEGRAN ) 50 MG capsule Take 100 mg by mouth at bedtime.     busPIRone  (BUSPAR ) 10 MG tablet TAKE 1 TABLET(10 MG) BY MOUTH THREE TIMES DAILY (Patient not taking: Reported on 04/21/2024) 90 tablet 5   gabapentin  (NEURONTIN ) 300 MG capsule Take 1 capsule (300 mg total) by mouth 3 (three) times daily for 14 days. (Patient not taking: Reported on 12/04/2023) 42 capsule 0   naloxone (NARCAN) nasal spray 4 mg/0.1 mL Place 0.4 mg into the nose once. PRN      No results found for this or any previous visit (from the past 48 hours). No results found.  Pertinent items noted in HPI and remainder of comprehensive ROS otherwise negative.  Blood pressure 111/70, pulse 75, temperature 98.1 F (36.7 C), temperature  source Oral, resp. rate 20, height 5' 8 (1.727 m), weight 65.8 kg, SpO2 97%.  Patient is awake and alert.  He is oriented and appropriate.  Speech is fluent.  Judgment insight intact.  Cranial nerve function normal bilaterally motor examination reveals intact motor strength bilaterally since examination some decrease sensation pinprick light touch in his left C6 and C7 dermatomes.  Deep tendon rhexis normal active.  No evidence of long track signs.  Examination head ears eyes nose throat is unremarkable chest and abdomen benign.  Extremities are free from injury deformity. 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  Plan C5-6, C6-7 anterior cervical discectomy with interbody fusion utilizing interbody cages, will close autograft, and anterior plate instrumentation.  Risks and benefits been explained.  Patient wishes to proceed.  Raymond [name] 04/21/2024, 7:57 AM

## 2024-04-21 NOTE — Transfer of Care (Signed)
 Immediate Anesthesia Transfer of Care Note  Patient: Raymond [name]  Procedure(s) Performed: ANTERIOR CERVICAL DECOMPRESSION/DISCECTOMY FUSION CERVICAL FIVE-CERVICAL SIX, CERVICAL SIX-CERVICAL SEVEN  Patient Location: PACU  Anesthesia Type:General  Level of Consciousness: awake, alert , and oriented  Airway & Oxygen Therapy: Patient Spontanous Breathing and Patient connected to nasal cannula oxygen  Post-op Assessment: Report given to RN and Post -op Vital signs reviewed and stable  Post vital signs: Reviewed and stable  Last Vitals:  Vitals Value Taken Time  BP 136/93 04/21/24 10:41  Temp    Pulse 84 04/21/24 10:44  Resp 11 04/21/24 10:44  SpO2 100 % 04/21/24 10:44  Vitals shown include unfiled device data.  Last Pain:  Vitals:   04/21/24 0652  TempSrc:   PainSc: 7          Complications: No notable events documented.

## 2024-04-21 NOTE — Op Note (Signed)
 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  Asst. Surgeon: Jennetta, NP  Anesthesia: General  Indication: 38 year old male with chronic neck pain and upper extremity symptoms failing conservative management workup demonstrates evidence of significant cervical disc generation with associated disc bulging and spondylosis causing moderately severe spinal stenosis and severe neuroforaminal stenosis at C5-6 and C6-7.  Patient presents now for two-level anterior cervical discectomy and fusion in hopes improving his symptoms.  Operative note: After induction of anesthesia, patient positioned supine with neck slightly extended and held placeholder traction.  Patient's anterior cervical region prepped draped sterilely.  Incision made overlying C6.  Dissection performed in the right.  Retractor placed.  Fluoroscopy used.  Levels confirmed.  Disc base at C5-6 and C6-7 incised.  Discectomy performed using various instruments down to level the posterior annulus.  Microscope was then brought to the field used throughout the remainder of the discectomy.  Remaining aspects of annulus and osteophytes were removed using high-speed drill down to level the posterior logical limb.  Posterior logical was elevated and resected in piecemeal fashion.  Underlying thecal sac was then identified.  A wide central decompression was then performed by undercutting the bodies of C5 and C6.  Decompression then proceeded each neural foramina.  Wide anterior foraminotomies were performed on the core 6 C6 nerve roots bilaterally.  At this point a very thorough decompression been  achieved.  There was no evidence of injury to the thecal sac or nerve roots.  Procedure was then repeated at C6-7 in a similar fashion again without complications.  Wounds were irrigated.  Gelfoam was placed topically for hemostasis then removed.  6 mm Medtronic anatomic peek cages were packed with locally harvested autograft and impacted into place.  Each cage was recessed slightly from the anterior cortical margins.  Atlantis anterior cervical plate was then placed over the C5, C6 and C7 levels.  This then attached to fluoroscopic guidance using 13 mm variable angle screws to each at all 3 levels.  All 6 screws given the final tightening found to be solidly through the bone.  Locking screws engaged in all levels.  Final images reveal good position of the cages and the hardware at the proper operative level with normal alignment of the spine.  Wound was then irrigated.  Hemostasis was assured.  Wound was then closed in layers with Vicryl sutures.  Steri-Strips and sterile dressing were applied.  No apparent complications.  Patient tolerated the procedure well and he returns to the recovery room postop.

## 2024-04-21 NOTE — Progress Notes (Signed)
 Orthopedic Tech Progress Note Patient Details:  Raymond [name] 07-04-1986 [id number]  Ortho Devices Type of Ortho Device: Soft collar Ortho Device/Splint Location: Neck Ortho Device/Splint Interventions: Application, Ordered   Post Interventions Patient Tolerated: Well  Raymond [name] [name] 04/21/2024, 11:16 AM

## 2024-04-21 NOTE — Anesthesia Procedure Notes (Signed)
 Procedure Name: Intubation Date/Time: 04/21/2024 8:25 AM  Performed by: Gene Colee A, CRNAPre-anesthesia Checklist: Patient identified, Emergency Drugs available, Suction available and Patient being monitored Patient Re-evaluated:Patient Re-evaluated prior to induction Oxygen Delivery Method: Circle System Utilized Preoxygenation: Pre-oxygenation with 100% oxygen Induction Type: IV induction Ventilation: Mask ventilation without difficulty Laryngoscope Size: Glidescope and 3 Grade View: Grade I Tube type: Oral Tube size: 7.0 mm Number of attempts: 2 Airway Equipment and Method: Stylet and Oral airway Placement Confirmation: ETT inserted through vocal cords under direct vision, positive ETCO2 and breath sounds checked- equal and bilateral Secured at: 23 cm Tube secured with: Tape Dental Injury: Teeth and Oropharynx as per pre-operative assessment  Comments: ETT replaced due to cuff tear

## 2024-04-22 ENCOUNTER — Encounter (HOSPITAL_COMMUNITY): Payer: Self-pay | Admitting: Neurosurgery

## 2024-04-22 DIAGNOSIS — M4802 Spinal stenosis, cervical region: Secondary | ICD-10-CM | POA: Diagnosis not present

## 2024-04-22 MED FILL — Thrombin For Soln 5000 Unit: CUTANEOUS | Qty: 2 | Status: AC

## 2024-04-22 NOTE — Evaluation (Signed)
 Occupational Therapy Evaluation Patient Details Name: Raymond [name] MRN: [id number] DOB: [birth year] Today's Date: 04/22/2024   History of Present Illness   Pt is a 38 y/o M s/p C5-6, C6-7 ACDF on 9/16. PMH Includes ADHD, anxiety, HTN, neuromuscular disorder     Clinical Impressions Pt ind at baseline with ADL/functional mobility, lives with spouse and mother who can assist at d/c. Pt currently needs up to supervision for ADLs, ind with bed mobility and supervision for transfers without AD. Pt educated on cervical precautions, collar wear, and compensatory strategies for ADL/mobility. Pt presenting with impairments listed below, however has no further OT needs at this time, will s/o. Anticipate no OT follow up needs at d/c.      If plan is discharge home, recommend the following:   A little help with walking and/or transfers;A little help with bathing/dressing/bathroom;Assistance with cooking/housework;Assist for transportation;Help with stairs or ramp for entrance     Functional Status Assessment   Patient has had a recent decline in their functional status and demonstrates the ability to make significant improvements in function in a reasonable and predictable amount of time.     Equipment Recommendations   None recommended by OT     Recommendations for Other Services         Precautions/Restrictions   Precautions Precautions: Cervical Precaution Booklet Issued: Yes (comment) Recall of Precautions/Restrictions: Intact Required Braces or Orthoses: Cervical Brace Cervical Brace: Soft collar;At all times Restrictions Weight Bearing Restrictions Per Provider Order: No     Mobility Bed Mobility Overal bed mobility: Independent             General bed mobility comments: via log roll    Transfers Overall transfer level: Needs assistance   Transfers: Sit to/from Stand Sit to Stand: Supervision                  Balance Overall balance assessment:  Mild deficits observed, not formally tested                                         ADL either performed or assessed with clinical judgement   ADL Overall ADL's : Needs assistance/impaired Eating/Feeding: Set up;Sitting   Grooming: Set up;Sitting   Upper Body Bathing: Sitting;Supervision/ safety   Lower Body Bathing: Sitting/lateral leans;Supervison/ safety   Upper Body Dressing : Sitting;Supervision/safety   Lower Body Dressing: Sitting/lateral leans;Supervision/safety   Toilet Transfer: Supervision/safety;Ambulation;Regular Social worker and Hygiene: Supervision/safety       Functional mobility during ADLs: Supervision/safety       Vision   Vision Assessment?: No apparent visual deficits     Perception Perception: Not tested       Praxis Praxis: Not tested       Pertinent Vitals/Pain Pain Assessment Pain Assessment: Faces Pain Score: 4  Faces Pain Scale: Hurts little more Pain Location: neck/incision Pain Descriptors / Indicators: Discomfort Pain Intervention(s): Monitored during session, Premedicated before session     Extremity/Trunk Assessment Upper Extremity Assessment Upper Extremity Assessment: Right hand dominant (mild R hand numbness)   Lower Extremity Assessment Lower Extremity Assessment: Defer to PT evaluation   Cervical / Trunk Assessment Cervical / Trunk Assessment: Normal   Communication Communication Communication: No apparent difficulties   Cognition Arousal: Alert Behavior During Therapy: WFL for tasks assessed/performed, Anxious Cognition: No apparent impairments  Following commands: Intact       Cueing  General Comments   Cueing Techniques: Verbal cues  VSS   Exercises     Shoulder Instructions      Home Living Family/patient expects to be discharged to:: Private residence Living Arrangements: Spouse/significant  other;Parent Available Help at Discharge: Family;Available 24 hours/day Type of Home: House Home Access: Level entry     Home Layout: One level     Bathroom Shower/Tub: Chief Strategy Officer: Standard     Home Equipment: Agricultural consultant (2 wheels);Crutches          Prior Functioning/Environment Prior Level of Function : Independent/Modified Independent                    OT Problem List: Decreased strength;Decreased range of motion;Impaired balance (sitting and/or standing);Decreased activity tolerance;Impaired vision/perception;Decreased cognition;Decreased safety awareness   OT Treatment/Interventions: Self-care/ADL training;Therapeutic exercise;Energy conservation;DME and/or AE instruction;Therapeutic activities;Balance training;Patient/family education      OT Goals(Current goals can be found in the care plan section)   Acute Rehab OT Goals Patient Stated Goal: none stated OT Goal Formulation: With patient Time For Goal Achievement: 05/06/24 Potential to Achieve Goals: Good   OT Frequency:  Min 2X/week    Co-evaluation              AM-PAC OT 6 Clicks Daily Activity     Outcome Measure Help from another person eating meals?: A Little Help from another person taking care of personal grooming?: A Little Help from another person toileting, which includes using toliet, bedpan, or urinal?: A Little Help from another person bathing (including washing, rinsing, drying)?: A Little Help from another person to put on and taking off regular upper body clothing?: A Little Help from another person to put on and taking off regular lower body clothing?: A Little 6 Click Score: 18   End of Session Equipment Utilized During Treatment: Cervical collar Nurse Communication: Mobility status  Activity Tolerance: Patient tolerated treatment well Patient left: in bed;with call bell/phone within reach  OT Visit Diagnosis: Unsteadiness on feet  (R26.81);Other abnormalities of gait and mobility (R26.89);Muscle weakness (generalized) (M62.81)                Time: 9165-9095 OT Time Calculation (min): 30 min Charges:  OT General Charges $OT Visit: 1 Visit OT Evaluation $OT Eval Low Complexity: 1 Low OT Treatments $Self Care/Home Management : 8-22 mins  Meleena Munroe K, OTD, OTR/L SecureChat Preferred Acute Rehab [phone]   Laneta POUR Koonce 04/22/2024, 10:16 AM

## 2024-04-22 NOTE — Anesthesia Postprocedure Evaluation (Signed)
 Anesthesia Post Note  Patient: Raymond [name]  Procedure(s) Performed: ANTERIOR CERVICAL DECOMPRESSION/DISCECTOMY FUSION CERVICAL FIVE-CERVICAL SIX, CERVICAL SIX-CERVICAL SEVEN     Patient location during evaluation: PACU Anesthesia Type: General Level of consciousness: awake and alert Pain management: pain level controlled Vital Signs Assessment: post-procedure vital signs reviewed and stable Respiratory status: spontaneous breathing, nonlabored ventilation, respiratory function stable and patient connected to nasal cannula oxygen Cardiovascular status: blood pressure returned to baseline and stable Postop Assessment: no apparent nausea or vomiting Anesthetic complications: no   No notable events documented.  Last Vitals:  Vitals:   04/22/24 0620 04/22/24 0746  BP: 100/64 98/67  Pulse: 81 85  Resp: 18 16  Temp:  36.8 C  SpO2: 97% 98%    Last Pain:  Vitals:   04/22/24 1015  TempSrc:   PainSc: 7                  Raymond [name] S

## 2024-04-22 NOTE — Plan of Care (Signed)
 Pt doing well. Pt given D/C instructions with verbal understanding. Rx's were sent to the pharmacy by MD. Pt's incision is clean and dry with no sign of infection. Pt's IV was removed prior to D/C. Pt D/C'd home via wheelchair per MD order. Pt is stable @ D/C and has no other needs at this time. Rema Fendt, RN

## 2024-04-22 NOTE — Discharge Instructions (Signed)

## 2024-04-22 NOTE — Discharge Summary (Signed)
 Physician Discharge Summary  Patient ID: Raymond [name] MRN: [id number] DOB/AGE: 11/16/1985 38 y.o.  Admit date: 04/21/2024 Discharge date: 04/22/2024  Admission Diagnoses:  Discharge Diagnoses:  Principal Problem:   Cervical spondylosis with myelopathy and radiculopathy   Discharged Condition: good  Hospital Course: Patient mated to the hospital where he underwent uncomplicated two-level anterior cervical decompression and fusion.  Postoperatively is doing reasonably well.  Patient complains of posterior neck pain and some interscapular pain.  He has a little bit of throat discomfort.  His voice is strong.  He is tolerating a diet without difficulty.  He has been up ambulating and is voiding without difficulty.  He is ready for discharge home.  Consults:   Significant Diagnostic Studies:   Treatments:   Discharge Exam: Blood pressure 98/67, pulse 85, temperature 98.3 F (36.8 C), temperature source Oral, resp. rate 16, height 5' 8 (1.727 m), weight 65.8 kg, SpO2 98%. Awake and alert.  Oriented and appropriate.  Motor and sensory function intact.  Wound clean and dry.  Neck soft.  Chest and abdomen benign.  Disposition: Discharge disposition: 01-Home or Self Care        Allergies as of 04/22/2024       Reactions   Cyclobenzaprine  Other (See Comments)   Dry mouth        Medication List     TAKE these medications    amphetamine -dextroamphetamine  30 MG 24 hr capsule Commonly known as: ADDERALL  XR Take 30 mg by mouth every morning.   amphetamine -dextroamphetamine  10 MG tablet Commonly known as: ADDERALL  Take 10 mg by mouth daily in the afternoon.   baclofen  10 MG tablet Commonly known as: LIORESAL  Take 10 mg by mouth daily.   busPIRone  10 MG tablet Commonly known as: BUSPAR  TAKE 1 TABLET(10 MG) BY MOUTH THREE TIMES DAILY   chlorhexidine  4 % external liquid Commonly known as: HIBICLENS  Apply 15 mLs (1 Application total) topically as directed for 30 doses.  Use as directed daily for 5 days every other week for 6 weeks.   gabapentin  300 MG capsule Commonly known as: Neurontin  Take 1 capsule (300 mg total) by mouth 3 (three) times daily for 14 days.   methylPREDNISolone  4 MG Tbpk tablet Commonly known as: MEDROL  DOSEPAK Take as directed per package instructions.   mupirocin  ointment 2 % Commonly known as: BACTROBAN  Place 1 Application into the nose 2 (two) times daily for 60 doses. Use as directed 2 times daily for 5 days every other week for 6 weeks.   naloxone 4 MG/0.1ML Liqd nasal spray kit Commonly known as: NARCAN Place 0.4 mg into the nose once. PRN   olmesartan 5 MG tablet Commonly known as: BENICAR Take 5 mg by mouth at bedtime.   oxyCODONE -acetaminophen  7.5-325 MG tablet Commonly known as: PERCOCET Take 1 tablet by mouth in the morning, at noon, in the evening, and at bedtime.   pregabalin  200 MG capsule Commonly known as: LYRICA  Take 200 mg by mouth at bedtime.   Spiriva  Respimat 1.25 MCG/ACT Aers Generic drug: Tiotropium Bromide  Monohydrate Inhale 2 puffs into the lungs daily.   tiZANidine  4 MG tablet Commonly known as: ZANAFLEX  Take 4 mg by mouth 3 (three) times daily.   Ventolin  HFA 108 (90 Base) MCG/ACT inhaler Generic drug: albuterol  Inhale 1-2 puffs into the lungs every 6 (six) hours as needed for shortness of breath or wheezing.   zolpidem  10 MG tablet Commonly known as: AMBIEN  Take 10 mg by mouth at bedtime.   zonisamide  50  MG capsule Commonly known as: ZONEGRAN  Take 100 mg by mouth at bedtime.         Signed: [facility] Avaleen Brownley 04/22/2024, 9:12 AM

## 2024-06-16 ENCOUNTER — Telehealth: Payer: Self-pay

## 2024-06-16 NOTE — Transitions of Care (Post Inpatient/ED Visit) (Unsigned)
   06/16/2024  Name: SAIVION GOETTEL MRN: [id number] DOB: 07/17/1986  Today's TOC FU Call Status: Today's TOC FU Call Status:: Unsuccessful Call (1st Attempt) Unsuccessful Call (1st Attempt) Date: 06/16/24  Attempted to reach the patient regarding the most recent Inpatient/ED visit.  Follow Up Plan: Additional outreach attempts will be made to reach the patient to complete the Transitions of Care (Post Inpatient/ED visit) call.   Signature Julian Lemmings, LPN [facility] Nurse Health Advisor Direct Dial [phone]

## 2024-06-17 NOTE — Transitions of Care (Post Inpatient/ED Visit) (Signed)
   06/17/2024  Name: ESTEL SCHOLZE MRN: [id number] DOB: 1986/05/30  Today's TOC FU Call Status: Today's TOC FU Call Status:: Successful TOC FU Call Completed Unsuccessful Call (1st Attempt) Date: 06/16/24 [facility] FU Call Complete Date: 06/17/24  Attempted to reach the patient regarding the most recent Inpatient/ED visit.  Follow Up Plan: No further outreach attempts will be made at this time. We have been unable to contact the patient. Patient in rehab at [facility] hospital Signature Julian Lemmings, LPN [facility] Nurse Health Advisor Direct Dial [phone]

## 2024-06-26 ENCOUNTER — Telehealth: Payer: Self-pay

## 2024-06-26 NOTE — Transitions of Care (Post Inpatient/ED Visit) (Unsigned)
   06/26/2024  Name: Raymond [name] MRN: [id number] DOB: 28-Apr-1986  Today's TOC FU Call Status: Today's TOC FU Call Status:: Unsuccessful Call (1st Attempt) Unsuccessful Call (1st Attempt) Date: 06/26/24  Attempted to reach the patient regarding the most recent Inpatient/ED visit.  Follow Up Plan: Additional outreach attempts will be made to reach the patient to complete the Transitions of Care (Post Inpatient/ED visit) call.   Signature Julian Lemmings, LPN [facility] Nurse Health Advisor Direct Dial [phone]

## 2024-06-29 NOTE — Transitions of Care (Post Inpatient/ED Visit) (Unsigned)
   06/29/2024  Name: Raymond [name] MRN: [id number] DOB: 26-Mar-1986  Today's TOC FU Call Status: Today's TOC FU Call Status:: Unsuccessful Call (2nd Attempt) Unsuccessful Call (1st Attempt) Date: 06/26/24 Unsuccessful Call (2nd Attempt) Date: 06/29/24  Attempted to reach the patient regarding the most recent Inpatient/ED visit.  Follow Up Plan: Additional outreach attempts will be made to reach the patient to complete the Transitions of Care (Post Inpatient/ED visit) call.   Signature Julian Lemmings, LPN [facility] Nurse Health Advisor Direct Dial [phone]

## 2024-06-30 NOTE — Transitions of Care (Post Inpatient/ED Visit) (Signed)
 06/30/2024  Name: Raymond [name] MRN: [id number] DOB: Dec 24, 1985  Today's TOC FU Call Status: Today's TOC FU Call Status:: Successful TOC FU Call Completed Unsuccessful Call (1st Attempt) Date: 06/26/24 Unsuccessful Call (2nd Attempt) Date: 06/29/24 [facility] FU Call Complete Date: 06/30/24  Patient's Name and Date of Birth confirmed. Name, DOB  Transition Care Management Follow-up Telephone Call Discharge Facility: Other (Non[facility] Facility) Name of Other (Non[facility]) Discharge Facility: [facility] Type of Discharge: Inpatient Admission Primary Inpatient Discharge Diagnosis:: fracture of humerus How have you been since you were released from the hospital?: Better Any questions or concerns?: No  Items Reviewed: Did you receive and understand the discharge instructions provided?: Yes Medications obtained,verified, and reconciled?: Yes (Medications Reviewed) Any new allergies since your discharge?: No Dietary orders reviewed?: Yes Do you have support at home?: Yes People in Home [RPT]: spouse  Medications Reviewed Today: Medications Reviewed Today     Reviewed by Emmitt Pan, LPN (Licensed Practical Nurse) on 06/30/24 at 1030  Med List Status: <None>   Medication Order Taking? Sig Documenting Provider Last Dose Status Informant  amphetamine -dextroamphetamine  (ADDERALL  XR) 30 MG 24 hr capsule [id number] Yes Take 30 mg by mouth every morning. [provider]  Active Self  amphetamine -dextroamphetamine  (ADDERALL ) 10 MG tablet [id number] Yes Take 10 mg by mouth daily in the afternoon. [provider]  Active Self  baclofen  (LIORESAL ) 10 MG tablet [id number] Yes Take 10 mg by mouth daily. [provider]  Active Self  busPIRone  (BUSPAR ) 10 MG tablet [id number]  TAKE 1 TABLET(10 MG) BY MOUTH THREE TIMES DAILY  Patient not taking: Reported on 06/30/2024   Alvan Dorothyann BIRCH, MD  Active Self  chlorhexidine  (HIBICLENS ) 4 % external liquid [id number] Yes Apply 15 mLs (1  Application total) topically as directed for 30 doses. Use as directed daily for 5 days every other week for 6 weeks. Louis Shove, MD  Active   gabapentin  (NEURONTIN ) 300 MG capsule [id number]  Take 1 capsule (300 mg total) by mouth 3 (three) times daily for 14 days.  Patient not taking: Reported on 06/30/2024   Zelaya, Oscar A, PA-C  Expired 12/17/23 2359 Self  methylPREDNISolone  (MEDROL  DOSEPAK) 4 MG TBPK tablet [id number] Yes Take as directed per package instructions. Ruthe Cornet, DO  Active Self  naloxone [facility]) nasal spray 4 mg/0.1 mL [id number] Yes Place 0.4 mg into the nose once. PRN [provider]  Active Self  olmesartan (BENICAR) 5 MG tablet [id number] Yes Take 5 mg by mouth at bedtime. [provider]  Active Self  oxyCODONE -acetaminophen  (PERCOCET) 7.5-325 MG tablet [id number] Yes Take 1 tablet by mouth in the morning, at noon, in the evening, and at bedtime. [provider]  Active Self  pregabalin  (LYRICA ) 200 MG capsule [id number] Yes Take 200 mg by mouth at bedtime. [provider]  Active Self  SPIRIVA  RESPIMAT 1.25 MCG/ACT AERS [id number] Yes Inhale 2 puffs into the lungs daily. [provider]  Active Self  tiZANidine  (ZANAFLEX ) 4 MG tablet [id number] Yes Take 4 mg by mouth 3 (three) times daily. [provider]  Active Self  VENTOLIN  HFA 108 (90 Base) MCG/ACT inhaler [id number] Yes Inhale 1-2 puffs into the lungs every 6 (six) hours as needed for shortness of breath or wheezing. [provider]  Active Self  zolpidem  (AMBIEN ) 10 MG tablet [id number] Yes Take 10 mg by mouth at bedtime. [provider]  Active Self  zonisamide  (ZONEGRAN ) 50 MG capsule [id number] Yes Take 100 mg by mouth at bedtime.  [provider]  Active Self            Home Care and Equipment/Supplies: Were Home Health Services Ordered?: NA Any new equipment or medical supplies ordered?: NA  Functional Questionnaire: Do you need  assistance with bathing/showering or dressing?: Yes Do you need assistance with meal preparation?: Yes Do you need assistance with eating?: No Do you have difficulty maintaining continence: No Do you need assistance with getting out of bed/getting out of a chair/moving?: Yes Do you have difficulty managing or taking your medications?: No  Follow up appointments reviewed: PCP Follow-up appointment confirmed?: Yes Date of PCP follow-up appointment?: 07/06/24 Follow-up Provider: [facility] Follow-up appointment confirmed?: Yes Date of Specialist follow-up appointment?: 06/29/24 Follow-Up Specialty Provider:: ortho Do you need transportation to your follow-up appointment?: No Do you understand care options if your condition(s) worsen?: Yes-patient verbalized understanding    SIGNATURE Julian Lemmings, LPN [facility] Nurse Health Advisor Direct Dial [phone]

## 2024-07-06 ENCOUNTER — Encounter: Payer: Self-pay | Admitting: Family Medicine

## 2024-07-06 ENCOUNTER — Ambulatory Visit: Admitting: Family Medicine

## 2024-07-06 VITALS — BP 121/77 | HR 76 | Ht 67.5 in | Wt 150.1 lb

## 2024-07-06 DIAGNOSIS — J939 Pneumothorax, unspecified: Secondary | ICD-10-CM

## 2024-07-06 DIAGNOSIS — I824Z3 Acute embolism and thrombosis of unspecified deep veins of distal lower extremity, bilateral: Secondary | ICD-10-CM

## 2024-07-06 DIAGNOSIS — S0181XA Laceration without foreign body of other part of head, initial encounter: Secondary | ICD-10-CM

## 2024-07-06 DIAGNOSIS — Z23 Encounter for immunization: Secondary | ICD-10-CM

## 2024-07-06 DIAGNOSIS — S2243XA Multiple fractures of ribs, bilateral, initial encounter for closed fracture: Secondary | ICD-10-CM
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
## 2024-07-08 ENCOUNTER — Encounter: Payer: Self-pay | Admitting: Family Medicine

## 2024-07-13 MED ORDER — ESZOPICLONE 3 MG PO TABS: 3.0000 mg | ORAL_TABLET | Freq: Every day | ORAL | 1 refills | Status: AC

## 2024-07-13 NOTE — Telephone Encounter (Signed)
 Meds ordered this encounter  Medications   Eszopiclone  3 MG TABS    Sig: Take 1 tablet (3 mg total) by mouth at bedtime. Take immediately before bedtime    Dispense:  30 tablet    Refill:  1    In place of Ambien .

## 2024-07-15 ENCOUNTER — Encounter: Payer: Self-pay | Admitting: Family Medicine

## 2024-07-15 DIAGNOSIS — F902 Attention-deficit hyperactivity disorder, combined type: Secondary | ICD-10-CM

## 2024-07-15 MED ORDER — AMPHETAMINE-DEXTROAMPHET ER 30 MG PO CP24
30.0000 mg | ORAL_CAPSULE | Freq: Every morning | ORAL | 0 refills | Status: AC
  Filled 2024-07-15: qty 30, 30d supply, fill #0

## 2024-07-15 MED ORDER — AMPHETAMINE-DEXTROAMPHETAMINE 10 MG PO TABS
10.0000 mg | ORAL_TABLET | Freq: Every day | ORAL | 0 refills | Status: AC
  Filled 2024-07-15: qty 30, 30d supply, fill #0

## 2024-07-15 NOTE — Telephone Encounter (Signed)
 Meds ordered this encounter  Medications   amphetamine -dextroamphetamine  (ADDERALL  XR) 30 MG 24 hr capsule    Sig: Take 1 capsule (30 mg total) by mouth every morning.    Dispense:  30 capsule    Refill:  0   amphetamine -dextroamphetamine  (ADDERALL ) 10 MG tablet    Sig: Take 1 tablet (10 mg total) by mouth daily in the afternoon.    Dispense:  30 tablet    Refill:  0

## 2024-07-16 ENCOUNTER — Other Ambulatory Visit (HOSPITAL_BASED_OUTPATIENT_CLINIC_OR_DEPARTMENT_OTHER): Payer: Self-pay

## 2024-09-07 ENCOUNTER — Telehealth: Admitting: Family Medicine

## 2024-09-07 ENCOUNTER — Encounter: Payer: Self-pay | Admitting: Family Medicine

## 2024-09-07 DIAGNOSIS — M4722 Other spondylosis with radiculopathy, cervical region: Secondary | ICD-10-CM | POA: Diagnosis not present

## 2024-09-07 DIAGNOSIS — F411 Generalized anxiety disorder: Secondary | ICD-10-CM

## 2024-09-07 DIAGNOSIS — M4712 Other spondylosis with myelopathy, cervical region: Secondary | ICD-10-CM | POA: Diagnosis not present

## 2024-09-07 DIAGNOSIS — F4310 Post-traumatic stress disorder, unspecified: Secondary | ICD-10-CM

## 2024-09-07 DIAGNOSIS — M961 Postlaminectomy syndrome, not elsewhere classified: Secondary | ICD-10-CM | POA: Insufficient documentation

## 2024-09-07 MED ORDER — HYDROXYZINE HCL 25 MG PO TABS: 25.0000 mg | ORAL_TABLET | Freq: Four times a day (QID) | ORAL | 1 refills | Status: AC | PRN

## 2024-09-07 MED ORDER — DULOXETINE HCL 30 MG PO CPEP: 30.0000 mg | ORAL_CAPSULE | Freq: Every day | ORAL | 1 refills | Status: AC

## 2024-09-07 NOTE — Progress Notes (Signed)
 Prednisone  20 mg x 5 days, Hydroxyzine  25 mg q6h prn   Changed Oxycodone  from 15 mg  QIDprn to 10 mg six times daily prn.

## 2024-09-08 ENCOUNTER — Encounter: Payer: Self-pay | Admitting: Family Medicine

## 2024-09-11 ENCOUNTER — Other Ambulatory Visit: Payer: Self-pay

## 2024-09-11 ENCOUNTER — Emergency Department (HOSPITAL_BASED_OUTPATIENT_CLINIC_OR_DEPARTMENT_OTHER)

## 2024-09-11 ENCOUNTER — Encounter (HOSPITAL_COMMUNITY): Payer: Self-pay

## 2024-09-11 ENCOUNTER — Inpatient Hospital Stay (HOSPITAL_COMMUNITY): Admission: AD | Admit: 2024-09-11 | Source: Intra-hospital

## 2024-09-11 ENCOUNTER — Emergency Department (HOSPITAL_BASED_OUTPATIENT_CLINIC_OR_DEPARTMENT_OTHER)
Admission: EM | Admit: 2024-09-11 | Discharge: 2024-09-11 | Disposition: A | Discharge status: Other Acute Inpatient Hospital | Source: Home / Self Care

## 2024-09-11 ENCOUNTER — Encounter (HOSPITAL_BASED_OUTPATIENT_CLINIC_OR_DEPARTMENT_OTHER): Payer: Self-pay | Admitting: Emergency Medicine

## 2024-09-11 DIAGNOSIS — F329 Major depressive disorder, single episode, unspecified: Principal | ICD-10-CM | POA: Diagnosis present

## 2024-09-11 DIAGNOSIS — R45851 Suicidal ideations: Secondary | ICD-10-CM

## 2024-09-11 DIAGNOSIS — F4310 Post-traumatic stress disorder, unspecified: Secondary | ICD-10-CM | POA: Diagnosis present

## 2024-09-11 DIAGNOSIS — F4323 Adjustment disorder with mixed anxiety and depressed mood: Secondary | ICD-10-CM | POA: Insufficient documentation

## 2024-09-11 LAB — URINE DRUG SCREEN
Amphetamines: NEGATIVE
Barbiturates: NEGATIVE
Benzodiazepines: NEGATIVE
Cocaine: NEGATIVE
Fentanyl: NEGATIVE
Methadone Scn, Ur: NEGATIVE
Opiates: NEGATIVE
Tetrahydrocannabinol: POSITIVE — AB

## 2024-09-11 LAB — CBC WITH DIFFERENTIAL/PLATELET
Abs Immature Granulocytes: 0.04 10*3/uL (ref 0.00–0.07)
Basophils Absolute: 0 10*3/uL (ref 0.0–0.1)
Basophils Relative: 0 %
Eosinophils Absolute: 0 10*3/uL (ref 0.0–0.5)
Eosinophils Relative: 0 %
HCT: 36.8 % — ABNORMAL LOW (ref 39.0–52.0)
Hemoglobin: 11.8 g/dL — ABNORMAL LOW (ref 13.0–17.0)
Immature Granulocytes: 1 %
Lymphocytes Relative: 12 %
Lymphs Abs: 1 10*3/uL (ref 0.7–4.0)
MCH: 19 pg — ABNORMAL LOW (ref 26.0–34.0)
MCHC: 32.1 g/dL (ref 30.0–36.0)
MCV: 59.4 fL — ABNORMAL LOW (ref 80.0–100.0)
Monocytes Absolute: 0.6 10*3/uL (ref 0.1–1.0)
Monocytes Relative: 7 %
Neutro Abs: 6.8 10*3/uL (ref 1.7–7.7)
Neutrophils Relative %: 80 %
Platelets: 215 10*3/uL (ref 150–400)
RBC: 6.2 MIL/uL — ABNORMAL HIGH (ref 4.22–5.81)
RDW: 16.4 % — ABNORMAL HIGH (ref 11.5–15.5)
WBC: 8.5 10*3/uL (ref 4.0–10.5)
nRBC: 0 % (ref 0.0–0.2)

## 2024-09-11 LAB — COMPREHENSIVE METABOLIC PANEL WITH GFR
ALT: 10 U/L (ref 0–44)
AST: 12 U/L — ABNORMAL LOW (ref 15–41)
Albumin: 4.9 g/dL (ref 3.5–5.0)
Alkaline Phosphatase: 94 U/L (ref 38–126)
Anion gap: 11 (ref 5–15)
BUN: 13 mg/dL (ref 6–20)
CO2: 28 mmol/L (ref 22–32)
Calcium: 9.1 mg/dL (ref 8.9–10.3)
Chloride: 99 mmol/L (ref 98–111)
Creatinine, Ser: 0.89 mg/dL (ref 0.61–1.24)
GFR, Estimated: >60 mL/min
Glucose, Bld: 107 mg/dL — ABNORMAL HIGH (ref 70–99)
Potassium: 3.4 mmol/L — ABNORMAL LOW (ref 3.5–5.1)
Sodium: 139 mmol/L (ref 135–145)
Total Bilirubin: 0.9 mg/dL (ref 0.0–1.2)
Total Protein: 6.9 g/dL (ref 6.5–8.1)

## 2024-09-11 LAB — URINALYSIS, ROUTINE W REFLEX MICROSCOPIC
Glucose, UA: NEGATIVE mg/dL
Hgb urine dipstick: NEGATIVE
Ketones, ur: 40 mg/dL — AB
Leukocytes,Ua: NEGATIVE
Nitrite: NEGATIVE
Protein, ur: 100 mg/dL — AB
Specific Gravity, Urine: 1.025 (ref 1.005–1.030)
pH: 6 (ref 5.0–8.0)

## 2024-09-11 LAB — URINALYSIS, MICROSCOPIC (REFLEX)

## 2024-09-11 LAB — D-DIMER, QUANTITATIVE: D-Dimer, Quant: <0.27 ug{FEU}/mL (ref 0.00–0.50)

## 2024-09-11 LAB — ETHANOL: Alcohol, Ethyl (B): <15 mg/dL

## 2024-09-11 LAB — TROPONIN T, HIGH SENSITIVITY: Troponin T High Sensitivity: 9 ng/L (ref 0–19)

## 2024-09-11 MED ORDER — ALUM & MAG HYDROXIDE-SIMETH 200-200-20 MG/5ML PO SUSP: 30.0000 mL | ORAL | Status: AC | PRN

## 2024-09-11 MED ORDER — LORAZEPAM 2 MG/ML IJ SOLN: 2.0000 mg | Freq: Three times a day (TID) | INTRAMUSCULAR | Status: AC | PRN

## 2024-09-11 MED ORDER — PREGABALIN 100 MG PO CAPS
200.0000 mg | ORAL_CAPSULE | Freq: Two times a day (BID) | ORAL | Status: AC
  Administered 2024-09-11: 200 mg via ORAL
  Filled 2024-09-11: qty 2

## 2024-09-11 MED ORDER — DULOXETINE HCL 30 MG PO CPEP: 30.0000 mg | ORAL_CAPSULE | Freq: Every day | ORAL | Status: AC

## 2024-09-11 MED ORDER — OXYCODONE HCL 5 MG PO TABS
10.0000 mg | ORAL_TABLET | Freq: Four times a day (QID) | ORAL | Status: AC | PRN
  Administered 2024-09-11: 10 mg via ORAL
  Filled 2024-09-11: qty 2

## 2024-09-11 MED ORDER — APIXABAN 5 MG PO TABS
5.0000 mg | ORAL_TABLET | Freq: Two times a day (BID) | ORAL | Status: AC
  Administered 2024-09-11: 5 mg via ORAL
  Filled 2024-09-11: qty 1

## 2024-09-11 MED ORDER — HALOPERIDOL LACTATE 5 MG/ML IJ SOLN: 10.0000 mg | Freq: Three times a day (TID) | INTRAMUSCULAR | Status: AC | PRN

## 2024-09-11 MED ORDER — PREGABALIN 75 MG PO CAPS: 200.0000 mg | ORAL_CAPSULE | Freq: Two times a day (BID) | ORAL | Status: DC

## 2024-09-11 MED ORDER — HALOPERIDOL 5 MG PO TABS: 5.0000 mg | ORAL_TABLET | Freq: Three times a day (TID) | ORAL | Status: AC | PRN

## 2024-09-11 MED ORDER — OXYCODONE HCL 5 MG PO TABS
10.0000 mg | ORAL_TABLET | Freq: Three times a day (TID) | ORAL | Status: DC | PRN
  Administered 2024-09-11: 10 mg via ORAL
  Filled 2024-09-11: qty 2

## 2024-09-11 MED ORDER — OXYCODONE-ACETAMINOPHEN 5-325 MG PO TABS
1.0000 | ORAL_TABLET | Freq: Once | ORAL | Status: AC
  Administered 2024-09-11: 1 via ORAL
  Filled 2024-09-11: qty 1

## 2024-09-11 MED ORDER — LORAZEPAM 1 MG PO TABS
0.5000 mg | ORAL_TABLET | Freq: Once | ORAL | Status: AC
  Administered 2024-09-11: 0.5 mg via ORAL
  Filled 2024-09-11: qty 1

## 2024-09-11 MED ORDER — CEPHALEXIN 250 MG PO CAPS
500.0000 mg | ORAL_CAPSULE | Freq: Once | ORAL | Status: AC
  Administered 2024-09-11: 500 mg via ORAL
  Filled 2024-09-11: qty 2

## 2024-09-11 MED ORDER — DIPHENHYDRAMINE HCL 50 MG/ML IJ SOLN: 50.0000 mg | Freq: Three times a day (TID) | INTRAMUSCULAR | Status: AC | PRN

## 2024-09-11 MED ORDER — HYDROXYZINE HCL 25 MG PO TABS
25.0000 mg | ORAL_TABLET | Freq: Three times a day (TID) | ORAL | Status: AC | PRN
  Filled 2024-09-11 (×2): qty 1

## 2024-09-11 MED ORDER — TIZANIDINE HCL 2 MG PO TABS
4.0000 mg | ORAL_TABLET | Freq: Three times a day (TID) | ORAL | Status: AC | PRN
  Administered 2024-09-11: 4 mg via ORAL
  Filled 2024-09-11: qty 2

## 2024-09-11 MED ORDER — ACETAMINOPHEN 325 MG PO TABS: 650.0000 mg | ORAL_TABLET | Freq: Four times a day (QID) | ORAL | Status: AC | PRN

## 2024-09-11 MED ORDER — DIPHENHYDRAMINE HCL 25 MG PO CAPS: 50.0000 mg | ORAL_CAPSULE | Freq: Three times a day (TID) | ORAL | Status: AC | PRN

## 2024-09-11 MED ORDER — MAGNESIUM HYDROXIDE 400 MG/5ML PO SUSP: 30.0000 mL | Freq: Every day | ORAL | Status: AC | PRN

## 2024-09-11 MED ORDER — HALOPERIDOL LACTATE 5 MG/ML IJ SOLN: 5.0000 mg | Freq: Three times a day (TID) | INTRAMUSCULAR | Status: AC | PRN

## 2024-09-11 MED ORDER — BACLOFEN 10 MG PO TABS
10.0000 mg | ORAL_TABLET | Freq: Every day | ORAL | Status: AC
  Administered 2024-09-11: 10 mg via ORAL
  Filled 2024-09-11: qty 1

## 2024-09-11 MED ORDER — LORAZEPAM 1 MG PO TABS
2.0000 mg | ORAL_TABLET | Freq: Once | ORAL | Status: AC
  Administered 2024-09-11: 2 mg via ORAL
  Filled 2024-09-11: qty 2

## 2024-09-11 MED ORDER — APIXABAN 5 MG PO TABS: 5.0000 mg | ORAL_TABLET | Freq: Two times a day (BID) | ORAL | Status: DC
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
## 2024-09-11 NOTE — ED Notes (Signed)
[facility] called for transfer to [facility]. 

## 2024-09-11 NOTE — Progress Notes (Signed)
 Patient has been accepted to [facility]. Accepting information sent to RN and provider by [facility]   [place] Missey Hasley LCSW  09/11/2024 1:09 PM

## 2024-09-11 NOTE — ED Notes (Addendum)
 Patient dressed out in purple scubs Socks Patient wearing orthotonic sneekers
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
## 2024-09-11 NOTE — Group Note (Signed)
 Date:  09/11/2024 Time:  8:15 PM  Group Topic/Focus:  Wrap-Up Group:   The focus of this group is to help patients review their daily goal of treatment and discuss progress on daily workbooks.    Participation Level:  Did Not Attend  Participation Quality:  none  Affect:  n/a  Cognitive:  n/a  Insight: None  Engagement in Group:  none  Modes of Intervention:  none  Additional Comments:   Pt did not attend AA meeting  Tarri Guilfoil A Kahley Leib 09/11/2024, 8:15 PM
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
## 2024-09-11 NOTE — Group Note (Signed)
 Date:  09/11/2024 Time:  8:16 PM  Group Topic/Focus:  Making Healthy Choices:   The focus of this group is to help patients identify negative/unhealthy choices they were using prior to admission and identify positive/healthier coping strategies to replace them upon discharge. In context of how diet and microbiome affect mental health.    Participation Level:  Did Not Attend   Juliene CHRISTELLA Huddle 09/11/2024, 8:16 PM
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
## 2024-09-11 NOTE — Group Note (Unsigned)
 Date:  09/11/2024 Time:  9:25 PM  Group Topic/Focus:  Wrap-Up Group:   The focus of this group is to help patients review their daily goal of treatment and discuss progress on daily workbooks.     Participation Level:  {[facility] PARTICIPATION OZCZO:77735}  Participation Quality:  {[facility] PARTICIPATION QUALITY:22265}  Affect:  {[facility] AFFECT:22266}  Cognitive:  {[facility] COGNITIVE:22267}  Insight: {[facility] Insight2:20797}  Engagement in Group:  {[facility] ENGAGEMENT IN HMNLE:77731}  Modes of Intervention:  {[facility] MODES OF INTERVENTION:22269}  Additional Comments:  ***  Raymond [name] 09/11/2024, 9:25 PM

## 2024-09-11 NOTE — Plan of Care (Signed)
   Problem: Education: Goal: Knowledge of [facility] Education information/materials will improve Outcome: Progressing   Problem: Coping: Goal: Ability to verbalize frustrations and anger appropriately will improve Outcome: Progressing

## 2024-09-11 NOTE — ED Notes (Signed)
 Recliner in patients room for patients mother
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
## 2024-09-11 NOTE — Progress Notes (Incomplete)
(  Sleep Hours) - (Any PRNs that were needed, meds refused, or side effects to meds)-  (Any disturbances and when (visitation, over night)- (Concerns raised by the patient)-  (SI/HI/AVH)-
